# Patient Record
Sex: Female | Born: 1960
Health system: Southern US, Community
[De-identification: ages and names within clinical notes are randomized; demographics above are authoritative.]

## PROBLEM LIST (undated history)

## (undated) DIAGNOSIS — J45909 Unspecified asthma, uncomplicated: Secondary | ICD-10-CM

## (undated) DIAGNOSIS — E119 Type 2 diabetes mellitus without complications: Secondary | ICD-10-CM

## (undated) DIAGNOSIS — J310 Chronic rhinitis: Secondary | ICD-10-CM

## (undated) HISTORY — DX: Chronic rhinitis: J31.0

---

## 2010-09-25 HISTORY — PX: VEIN SURGERY: SHX48

## 2015-08-10 ENCOUNTER — Ambulatory Visit (INDEPENDENT_AMBULATORY_CARE_PROVIDER_SITE_OTHER): Payer: Managed Care, Other (non HMO) | Admitting: Allergy and Immunology

## 2015-08-10 ENCOUNTER — Encounter: Payer: Self-pay | Admitting: Allergy and Immunology

## 2015-08-10 VITALS — BP 110/70 | HR 76 | Temp 98.2°F | Resp 16 | Ht 64.76 in | Wt 194.0 lb

## 2015-08-10 DIAGNOSIS — J3089 Other allergic rhinitis: Secondary | ICD-10-CM | POA: Insufficient documentation

## 2015-08-10 DIAGNOSIS — J45901 Unspecified asthma with (acute) exacerbation: Secondary | ICD-10-CM | POA: Diagnosis not present

## 2015-08-10 HISTORY — DX: Other allergic rhinitis: J30.89

## 2015-08-10 MED ORDER — ALBUTEROL SULFATE 108 (90 BASE) MCG/ACT IN AEPB: 1.0000 | INHALATION_SPRAY | RESPIRATORY_TRACT | Status: DC | PRN

## 2015-08-10 MED ORDER — BUDESONIDE-FORMOTEROL FUMARATE 160-4.5 MCG/ACT IN AERO
INHALATION_SPRAY | RESPIRATORY_TRACT | Status: DC
Start: 2015-08-10 — End: 2015-09-07

## 2015-08-10 MED ORDER — MONTELUKAST SODIUM 10 MG PO TABS: 10.0000 mg | ORAL_TABLET | Freq: Every day | ORAL | Status: DC

## 2015-08-10 NOTE — Assessment & Plan Note (Addendum)
   To jumpstart symptom relief, prednisone has been provided: 20 mg x 4 days, 10 mg x1 day, then stop. A prescription has been provided for Symbicort (budesonide/formoterol) 160/4.5 g, 2 inhalations twice a day. To maximize pulmonary deposition, a spacer has been provided along with instructions for its proper administration with an HFA inhaler.  A prescription has been provided for montelukast 10 mg daily at bedtime.  A prescription has been provided for ProAir Respiclick, 1-2 inhalations every 4-6 hours as needed.  Subjective and objective measures of pulmonary function will be followed and the treatment plan will be adjusted accordingly.

## 2015-08-10 NOTE — Assessment & Plan Note (Signed)
   Aeroallergen avoidance measures have been discussed and provided in written form.  A prescription has been provided for montelukast (as above).  Continue fluticasone nasal spray, 2 sprays per nostril daily as needed.  Nasal saline lavage as needed has been recommended along with instructions for proper administration.  If allergen avoidance measures and medications fail to adequately relieve symptoms, we will consider immunotherapy.

## 2015-08-10 NOTE — Patient Instructions (Addendum)
Asthma with acute exacerbation  To jumpstart symptom relief, prednisone has been provided: 20 mg x 4 days, 10 mg x1 day, then stop. A prescription has been provided for Symbicort (budesonide/formoterol) 160/4.5 g, 2 inhalations twice a day. To maximize pulmonary deposition, a spacer has been provided along with instructions for its proper administration with an HFA inhaler.  A prescription has been provided for montelukast 10 mg daily at bedtime.  A prescription has been provided for ProAir Respiclick, 1-2 inhalations every 4-6 hours as needed.  Subjective and objective measures of pulmonary function will be followed and the treatment plan will be adjusted accordingly.     Allergic rhinitis  Aeroallergen avoidance measures have been discussed and provided in written form.  A prescription has been provided for montelukast (as above).  Continue fluticasone nasal spray, 2 sprays per nostril daily as needed.  Nasal saline lavage as needed has been recommended along with instructions for proper administration.  If allergen avoidance measures and medications fail to adequately relieve symptoms, we will consider immunotherapy.    Return in about 4 weeks (around 09/07/2015), or if symptoms worsen or fail to improve.   Reducing Pollen Exposure  The American Academy of Allergy, Asthma and Immunology suggests the following steps to reduce your exposure to pollen during allergy seasons.    1. Do not hang sheets or clothing out to dry; pollen may collect on these items. 2. Do not mow lawns or spend time around freshly cut grass; mowing stirs up pollen. 3. Keep windows closed at night.  Keep car windows closed while driving. 4. Minimize morning activities outdoors, a time when pollen counts are usually at their highest. 5. Stay indoors as much as possible when pollen counts or humidity is high and on windy days when pollen tends to remain in the air longer. 6. Use air conditioning when  possible.  Many air conditioners have filters that trap the pollen spores. 7. Use a HEPA room air filter to remove pollen form the indoor air you breathe.   Control of House Dust Mite Allergen  House dust mites play a major role in allergic asthma and rhinitis.  They occur in environments with high humidity wherever human skin, the food for dust mites is found. High levels have been detected in dust obtained from mattresses, pillows, carpets, upholstered furniture, bed covers, clothes and soft toys.  The principal allergen of the house dust mite is found in its feces.  A gram of dust may contain 1,000 mites and 250,000 fecal particles.  Mite antigen is easily measured in the air during house cleaning activities.    1. Encase mattresses, including the box spring, and pillow, in an air tight cover.  Seal the zipper end of the encased mattresses with wide adhesive tape. 2. Wash the bedding in water of 130 degrees Farenheit weekly.  Avoid cotton comforters/quilts and flannel bedding: the most ideal bed covering is the dacron comforter. 3. Remove all upholstered furniture from the bedroom. 4. Remove carpets, carpet padding, rugs, and non-washable window drapes from the bedroom.  Wash drapes weekly or use plastic window coverings. 5. Remove all non-washable stuffed toys from the bedroom.  Wash stuffed toys weekly. 6. Have the room cleaned frequently with a vacuum cleaner and a damp dust-mop.  The patient should not be in a room which is being cleaned and should wait 1 hour after cleaning before going into the room. 7. Close and seal all heating outlets in the bedroom.  Otherwise, the room  will become filled with dust-laden air.  An electric heater can be used to heat the room. Reduce indoor humidity to less than 50%.  Do not use a humidifier.  Control of Dog or Cat Allergen  Avoidance is the best way to manage a dog or cat allergy. If you have a dog or cat and are allergic to dog or cats, consider  removing the dog or cat from the home. If you have a dog or cat but don't want to find it a new home, or if your family wants a pet even though someone in the household is allergic, here are some strategies that may help keep symptoms at bay:  1. Keep the pet out of your bedroom and restrict it to only a few rooms. Be advised that keeping the dog or cat in only one room will not limit the allergens to that room. 2. Don't pet, hug or kiss the dog or cat; if you do, wash your hands with soap and water. 3. High-efficiency particulate air (HEPA) cleaners run continuously in a bedroom or living room can reduce allergen levels over time. 4. Regular use of a high-efficiency vacuum cleaner or a central vacuum can reduce allergen levels. 5. Giving your dog or cat a bath at least once a week can reduce airborne allergen.  Control of Mold Allergen  Mold and fungi can grow on a variety of surfaces provided certain temperature and moisture conditions exist.  Outdoor molds grow on plants, decaying vegetation and soil.  The major outdoor mold, Alternaria dn Cladosporium, are found in very high numbers during hot and dry conditions.  Generally, a late Summer - Fall peak is seen for common outdoor fungal spores.  Rain will temporarily lower outdoor mold spore count, but counts rise rapidly when the rainy period ends.  The most important indoor molds are Aspergillus and Penicillium.  Dark, humid and poorly ventilated basements are ideal sites for mold growth.  The next most common sites of mold growth are the bathroom and the kitchen.  Outdoor [employer] 2. Use air conditioning and keep windows closed 3. Avoid exposure to decaying vegetation. 4. Avoid leaf raking. 5. Avoid grain handling. 6. Consider wearing a face mask if working in moldy areas.  Indoor Mold Control 1. Maintain humidity below 50%. 2. Clean washable surfaces with 5% bleach solution. 3. Remove sources e.g. Contaminated carpets.  Control of  Cockroach Allergen  Cockroach allergen has been identified as an important cause of acute attacks of asthma, especially in urban settings.  There are fifty-five species of cockroach that exist in the [place], however only three, the [place], [place] species produce allergen that can affect patients with Asthma.  Allergens can be obtained from fecal particles, egg casings and secretions from cockroaches.    1. Remove food sources. 2. Reduce access to water. 3. Seal access and entry points. 4. Spray runways with 0.5-1% Diazinon or Chlorpyrifos 5. Blow boric acid power under stoves and refrigerator. 6. Place bait stations (hydramethylnon) at feeding sites.

## 2015-08-10 NOTE — Progress Notes (Signed)
History of present illness: HPI Comments: Sierra Lyons is a 54 y.o. female who presents today for her initial evaluation of possible asthma.  She complains of coughing, dyspnea, and wheezing.  The symptoms are more severe at nighttime and triggered by moderate physical exertion.  At times the cough is productive thick but clear mucus.  The symptoms started in June and have not progressed since that time.  She reports that she has experienced nocturnal awakenings due to lower respiratory symptoms every night over the past month.  She has no known family history of asthma.  She also experiences sneezing, nasal congestion, rhinorrhea, and some postnasal drainage.  Fluticasone nasal spray has been beneficial.   Assessment and plan: Asthma with acute exacerbation  To jumpstart symptom relief, prednisone has been provided: 20 mg x 4 days, 10 mg x1 day, then stop. A prescription has been provided for Symbicort (budesonide/formoterol) 160/4.5 g, 2 inhalations twice a day. To maximize pulmonary deposition, a spacer has been provided along with instructions for its proper administration with an HFA inhaler.  A prescription has been provided for montelukast 10 mg daily at bedtime.  A prescription has been provided for ProAir Respiclick, 1-2 inhalations every 4-6 hours as needed.  Subjective and objective measures of pulmonary function will be followed and the treatment plan will be adjusted accordingly.  Allergic rhinitis  Aeroallergen avoidance measures have been discussed and provided in written form.  A prescription has been provided for montelukast (as above).  Continue fluticasone nasal spray, 2 sprays per nostril daily as needed.  Nasal saline lavage as needed has been recommended along with instructions for proper administration.  If allergen avoidance measures and medications fail to adequately relieve symptoms, we will consider immunotherapy.    Medications ordered this  encounter: Meds ordered this encounter  Medications  . budesonide-formoterol (SYMBICORT) 160-4.5 MCG/ACT inhaler    Sig: INHALE TWO PUFFS TWICE DAILY TO PREVENT COUGH OR WHEEZE. RINSE, GARGLE, AND SPIT AFTER USE. USE WITH SPACER.    Dispense:  1 Inhaler    Refill:  5  . montelukast (SINGULAIR) 10 MG tablet    Sig: Take 1 tablet (10 mg total) by mouth at bedtime.    Dispense:  30 tablet    Refill:  5  . Albuterol Sulfate (PROAIR RESPICLICK) 108 (90 BASE) MCG/ACT AEPB    Sig: Inhale 1-2 puffs into the lungs every 4 (four) hours as needed (for cough or wheeze).    Dispense:  1 each    Refill:  1    Diagnositics: Spirometry: FVC is 3.21 L and FEV1 is 2.41 L (91% predicted) with significant (280 mL, 12%) post-bronchodilator improvement. Allergy skin testing: Aeroallergen skin tests are positive to grass pollen, weed pollen, ragweed pollen, tree pollen, mold, cat hair, dust mite, cockroach antigen.     Physical examination: Blood pressure 110/70, pulse 76, temperature 98.2 F (36.8 C), temperature source Oral, resp. rate 16, height 5' 4.76" (1.645 m), weight 194 lb 0.1 oz (88 kg).  General: Alert, interactive, in no acute distress. HEENT: TMs pearly gray, turbinates moderately edematous with thick discharge, post-pharynx mildly erythematous. Neck: Supple without lymphadenopathy. Lungs: Clear to auscultation without wheezing, rhonchi or rales. CV: Normal S1, S2 without murmurs. Abdomen: Nondistended, nontender. Skin: Warm and dry, without lesions or rashes. Extremities:  No clubbing, cyanosis or edema. Neuro:   Grossly intact.  Review of systems: Review of Systems  Constitutional: Negative for fever, chills and weight loss.  HENT: Negative for nosebleeds.   Eyes:  Negative for blurred vision.  Respiratory: Positive for sputum production, shortness of breath and wheezing. Negative for hemoptysis.   Cardiovascular: Negative for chest pain.  Gastrointestinal: Negative for diarrhea  and constipation.  Genitourinary: Negative for dysuria.  Musculoskeletal: Negative for myalgias and joint pain.  Neurological: Negative for dizziness.  Endo/Heme/Allergies: Does not bruise/bleed easily.    Past medical history: Past Medical History  Diagnosis Date  . Chronic rhinitis around 1986    Past surgical history: Past Surgical History  Procedure Laterality Date  . Vein surgery  2012  . Cesarean section  1993, 1997, 1999    Family history: Family History  Problem Relation Age of Onset  . Diabetes Mother   . Hypertension Mother   . Allergic rhinitis Father   . Allergic rhinitis Sister   . Asthma Sister   . Urticaria Sister   . Diabetes Maternal Grandmother   . Hypertension Maternal Grandmother     Social history: Social History   Social History  . Marital Status: Single    Spouse Name: N/A  . Number of Children: N/A  . Years of Education: N/A   Occupational History  . Not on file.   Social History Main Topics  . Smoking status: Never Smoker   . Smokeless tobacco: Never Used  . Alcohol Use: Not on file  . Drug Use: Not on file  . Sexual Activity: Not on file   Other Topics Concern  . Not on file   Social History Narrative  . No narrative on file   Environmental History:  Sierra Lyons lives in a 52 year old house with carpeting throughout and central air/heat.  She is a nonsmoker without pets.  Known medication allergies: Allergies  Allergen Reactions  . Aspirin Swelling    Outpatient medications:   Medication List       This list is accurate as of: 08/10/15  1:07 PM.  Always use your most recent med list.               Albuterol Sulfate 108 (90 BASE) MCG/ACT Aepb  Commonly known as:  PROAIR RESPICLICK  Inhale 1-2 puffs into the lungs every 4 (four) hours as needed (for cough or wheeze).     budesonide-formoterol 160-4.5 MCG/ACT inhaler  Commonly known as:  SYMBICORT  INHALE TWO PUFFS TWICE DAILY TO PREVENT COUGH OR WHEEZE. RINSE,  GARGLE, AND SPIT AFTER USE. USE WITH SPACER.     dextromethorphan-guaiFENesin 30-600 MG 12hr tablet  Commonly known as:  MUCINEX DM  Take 1 tablet by mouth 2 (two) times daily.     fluticasone 50 MCG/ACT nasal spray  Commonly known as:  FLONASE  Place 1 spray into both nostrils daily.     montelukast 10 MG tablet  Commonly known as:  SINGULAIR  Take 1 tablet (10 mg total) by mouth at bedtime.        I appreciate the opportunity to take part in this [facility] care. Please do not hesitate to contact me with questions.  Sincerely,   R. Jorene Guest, MD

## 2015-09-07 ENCOUNTER — Ambulatory Visit (INDEPENDENT_AMBULATORY_CARE_PROVIDER_SITE_OTHER): Payer: Managed Care, Other (non HMO) | Admitting: Allergy and Immunology

## 2015-09-07 ENCOUNTER — Encounter: Payer: Self-pay | Admitting: Allergy and Immunology

## 2015-09-07 VITALS — BP 114/72 | HR 68 | Resp 16

## 2015-09-07 DIAGNOSIS — J4540 Moderate persistent asthma, uncomplicated: Secondary | ICD-10-CM | POA: Insufficient documentation

## 2015-09-07 DIAGNOSIS — J3089 Other allergic rhinitis: Secondary | ICD-10-CM | POA: Diagnosis not present

## 2015-09-07 HISTORY — DX: Moderate persistent asthma, uncomplicated: J45.40

## 2015-09-07 MED ORDER — BUDESONIDE-FORMOTEROL FUMARATE 80-4.5 MCG/ACT IN AERO: INHALATION_SPRAY | RESPIRATORY_TRACT | Status: DC

## 2015-09-07 NOTE — Assessment & Plan Note (Signed)
   Continue allergen avoidance measures, montelukast 10 mg daily, and fluticasone nasal spray as needed.

## 2015-09-07 NOTE — Progress Notes (Signed)
RE: Sierra Lyons MRN: [id number] DOB: 07/21/1961 [facility] [place] [address] [place] [id number] Date of Office Visit: 09/07/2015  Referring provider: No referring provider defined for this encounter.  Chief Complaint: Asthma and Medication Management   History of present illness: HPI Comments: Sierra Lyons is a 54 y.o. female with moderate persistent asthma and allergic rhinitis who presents today for follow up.  She reports significant improvement regarding upper and lower airway symptoms and states that she feels "completely different."  In the interval since her previous visit one month ago she has not required rescue medication, experienced nocturnal awakenings due to lower respiratory symptoms, nor have activities of daily living been limited.  She has no nasal symptom complaints today.   Assessment and plan: Moderate persistent asthma Well-controlled.  We will stepdown therapy at this time.  A prescription has been provided for Symbicort (budesonide/formoterol) 80/4.5 g, 2 inhalations via spacer device twice a day.  If lower respiratory symptoms progress in frequency and/or severity, the patient is to resume the previous dose.  For now, continue montelukast 10 mg daily bedtime and albuterol HFA as needed.  Subjective and objective measures of pulmonary function will be followed and the treatment plan will be adjusted accordingly.  Allergic rhinitis  Continue allergen avoidance measures, montelukast 10 mg daily, and fluticasone nasal spray as needed.    Medications ordered this encounter: Meds ordered this encounter  Medications  . budesonide-formoterol (SYMBICORT) 80-4.5 MCG/ACT inhaler    Sig: INHALE TWO PUFFS TWICE DAILY TO PREVENT COUGH OR WHEEZE. RINSE MOUTH AFTER USE. USE WITH SPACER.    Dispense:  1 Inhaler    Refill:  5    PLEASE KEEP ON FILE, PATIENT WILL CALL WHEN READY.     Diagnositics: Spirometry:  Normal with an FEV1 of 106% predicted.  Please see scanned spirometry results for details.    Physical examination: Blood pressure 114/72, pulse 68, resp. rate 16.  General: Alert, interactive, in no acute distress. HEENT: TMs pearly gray, turbinates minimally edematous without discharge, post-pharynx non erythematous. Neck: Supple without lymphadenopathy. Lungs: Clear to auscultation without wheezing, rhonchi or rales. CV: Normal S1, S2 without murmurs. Skin: Warm and dry, without lesions or rashes.  The following portions of the patient's history were reviewed and updated as appropriate: allergies, current medications, past family history, past medical history, past social history, past surgical history and problem list.  Outpatient medications:   Medication List       This list is accurate as of: 09/07/15 10:06 AM.  Always use your most recent med list.               Albuterol Sulfate 108 (90 BASE) MCG/ACT Aepb  Commonly known as:  PROAIR RESPICLICK  Inhale 1-2 puffs into the lungs every 4 (four) hours as needed (for cough or wheeze).     budesonide-formoterol 80-4.5 MCG/ACT inhaler  Commonly known as:  SYMBICORT  INHALE TWO PUFFS TWICE DAILY TO PREVENT COUGH OR WHEEZE. RINSE MOUTH AFTER USE. USE WITH SPACER.     dextromethorphan-guaiFENesin 30-600 MG 12hr tablet  Commonly known as:  MUCINEX DM  Take 1 tablet by mouth 2 (two) times daily.     fluticasone 50 MCG/ACT nasal spray  Commonly known as:  FLONASE  Place 1 spray into both nostrils daily.     montelukast 10 MG tablet  Commonly known as:  SINGULAIR  Take 1 tablet (10 mg total) by mouth  at bedtime.        Known medication allergies: Allergies  Allergen Reactions  . Aspirin Swelling    I appreciate the opportunity to take part in this [facility] care. Please do not hesitate to contact me with questions.  Sincerely,   R. Jorene Guest, MD

## 2015-09-07 NOTE — Patient Instructions (Addendum)
Moderate persistent asthma Well-controlled.  We will stepdown therapy at this time.  A prescription has been provided for Symbicort (budesonide/formoterol) 80/4.5 g, 2 inhalations via spacer device twice a day.  If lower respiratory symptoms progress in frequency and/or severity, the patient is to resume the previous dose.  For now, continue montelukast 10 mg daily bedtime and albuterol HFA as needed.  Subjective and objective measures of pulmonary function will be followed and the treatment plan will be adjusted accordingly.  Allergic rhinitis  Continue allergen avoidance measures, montelukast 10 mg daily, and fluticasone nasal spray as needed.    Return in about 4 months (around 01/06/2016), or if symptoms worsen or fail to improve.

## 2015-09-07 NOTE — Assessment & Plan Note (Addendum)
Well-controlled.  We will stepdown therapy at this time.  A prescription has been provided for Symbicort (budesonide/formoterol) 80/4.5 g, 2 inhalations via spacer device twice a day.  If lower respiratory symptoms progress in frequency and/or severity, the patient is to resume the previous dose.  For now, continue montelukast 10 mg daily bedtime and albuterol HFA as needed.  Subjective and objective measures of pulmonary function will be followed and the treatment plan will be adjusted accordingly.

## 2015-09-13 ENCOUNTER — Encounter: Payer: Self-pay | Admitting: *Deleted

## 2015-10-15 ENCOUNTER — Telehealth: Payer: Self-pay | Admitting: Allergy and Immunology

## 2015-10-15 NOTE — Telephone Encounter (Signed)
Spoke with patient advised the medications we have her on would not cause her to have body cramps that happen at night. Advised to go see pcp and they would advise from there.

## 2015-10-15 NOTE — Telephone Encounter (Signed)
Pt called and that she is having body pain and thanks it from one of the meds that she was put on. [facility] [place] [phone].

## 2015-11-10 ENCOUNTER — Encounter (HOSPITAL_COMMUNITY): Payer: Self-pay | Admitting: *Deleted

## 2015-11-10 ENCOUNTER — Emergency Department (HOSPITAL_COMMUNITY)
Admission: EM | Admit: 2015-11-10 | Discharge: 2015-11-10 | Disposition: A | Discharge status: Home / Self Care | Payer: 59 | Attending: Emergency Medicine | Admitting: Emergency Medicine

## 2015-11-10 DIAGNOSIS — R1084 Generalized abdominal pain: Secondary | ICD-10-CM | POA: Diagnosis not present

## 2015-11-10 DIAGNOSIS — R358 Other polyuria: Secondary | ICD-10-CM | POA: Diagnosis present

## 2015-11-10 DIAGNOSIS — M79609 Pain in unspecified limb: Secondary | ICD-10-CM | POA: Insufficient documentation

## 2015-11-10 DIAGNOSIS — Z79899 Other long term (current) drug therapy: Secondary | ICD-10-CM | POA: Insufficient documentation

## 2015-11-10 DIAGNOSIS — L293 Anogenital pruritus, unspecified: Secondary | ICD-10-CM | POA: Diagnosis not present

## 2015-11-10 DIAGNOSIS — Z7951 Long term (current) use of inhaled steroids: Secondary | ICD-10-CM | POA: Insufficient documentation

## 2015-11-10 DIAGNOSIS — R739 Hyperglycemia, unspecified: Secondary | ICD-10-CM | POA: Diagnosis not present

## 2015-11-10 DIAGNOSIS — Z8709 Personal history of other diseases of the respiratory system: Secondary | ICD-10-CM | POA: Insufficient documentation

## 2015-11-10 LAB — URINALYSIS, ROUTINE W REFLEX MICROSCOPIC
Bilirubin Urine: NEGATIVE
Hgb urine dipstick: NEGATIVE
Ketones, ur: >80 mg/dL — AB
LEUKOCYTES UA: NEGATIVE
NITRITE: NEGATIVE
PROTEIN: NEGATIVE mg/dL
Specific Gravity, Urine: 1.039 — ABNORMAL HIGH (ref 1.005–1.030)
pH: 5 (ref 5.0–8.0)

## 2015-11-10 LAB — BLOOD GAS, VENOUS
ACID-BASE EXCESS: 1.7 mmol/L (ref 0.0–2.0)
BICARBONATE: 26.7 meq/L — AB (ref 20.0–24.0)
FIO2: 0.21
O2 SAT: 41.6 %
PATIENT TEMPERATURE: 98.6
TCO2: 23.7 mmol/L (ref 0–100)
pCO2, Ven: 45.3 mmHg (ref 45.0–50.0)
pH, Ven: 7.388 — ABNORMAL HIGH (ref 7.250–7.300)

## 2015-11-10 LAB — CBG MONITORING, ED
GLUCOSE-CAPILLARY: 291 mg/dL — AB (ref 65–99)
Glucose-Capillary: 169 mg/dL — ABNORMAL HIGH (ref 65–99)

## 2015-11-10 LAB — BASIC METABOLIC PANEL
Anion gap: 11 (ref 5–15)
BUN: 15 mg/dL (ref 6–20)
CALCIUM: 9.5 mg/dL (ref 8.9–10.3)
CO2: 25 mmol/L (ref 22–32)
Chloride: 100 mmol/L — ABNORMAL LOW (ref 101–111)
Creatinine, Ser: 0.69 mg/dL (ref 0.44–1.00)
GFR calc Af Amer: >60 mL/min (ref >60)
GLUCOSE: 288 mg/dL — AB (ref 65–99)
Potassium: 4.3 mmol/L (ref 3.5–5.1)
Sodium: 136 mmol/L (ref 135–145)

## 2015-11-10 LAB — URINE MICROSCOPIC-ADD ON: RBC / HPF: NONE SEEN RBC/hpf (ref 0–5)

## 2015-11-10 MED ORDER — SODIUM CHLORIDE 0.9 % IV BOLUS (SEPSIS)
1000.0000 mL | Freq: Once | INTRAVENOUS | Status: AC
  Administered 2015-11-10: 1000 mL via INTRAVENOUS

## 2015-11-10 MED ORDER — METFORMIN HCL 500 MG PO TABS: 500.0000 mg | ORAL_TABLET | Freq: Two times a day (BID) | ORAL | Status: DC

## 2015-11-10 MED ORDER — FLUCONAZOLE 200 MG PO TABS: 200.0000 mg | ORAL_TABLET | Freq: Every day | ORAL | Status: AC

## 2015-11-10 MED ORDER — SODIUM CHLORIDE 0.9 % IV BOLUS (SEPSIS): 1000.0000 mL | Freq: Once | INTRAVENOUS | Status: DC

## 2015-11-10 NOTE — ED Notes (Signed)
Pt reports bilat leg cramping x4-6 weeks, polyuria and polydipsia as well - pt also c/o vaginal burning/itching, has used OTC medications w/o relief.

## 2015-11-10 NOTE — Discharge Instructions (Signed)
As discussed, your symptoms are likely due to hyper glycemia, or elevated blood sugar.  You are starting a new medication.  Is very important to follow-up with a primary care physician within the next 2 weeks to insure that your condition has resolved, and the medication is appropriate.  Return here for any concerning changes in your condition.

## 2015-11-10 NOTE — ED Provider Notes (Signed)
CSN: [id number]     Arrival date & time 11/10/15  1027 History   First MD Initiated Contact with Patient 11/10/15 [phone]     Chief Complaint  Patient presents with  . Leg Pain  . Polyuria  . Vaginal Itching     (Consider location/radiation/quality/duration/timing/severity/associated sxs/prior Treatment) HPI Patient presents with multiple complaints. Over at least the past month the patient has had diffuse bilateral lower abdominal cramping, polyuria, polydipsia, and dysuria. Patient did start a course of new allergy medication about the time of onset of symptoms, but has stopped this medication. Currently she takes no prescription medication. She denies ongoing abdominal pain, fever, chest pain, confusion, disorientation, hematuria. Patient denies chronic medical problems. Patient acknowledges family history of diabetes. Since onset, the patient has been drinking substantial fluids, with no reduction in her symptoms.   Past Medical History  Diagnosis Date  . Chronic rhinitis around 1986   Past Surgical History  Procedure Laterality Date  . Vein surgery  2012  . Cesarean section  1993, 1997, 1999   Family History  Problem Relation Age of Onset  . Diabetes Mother   . Hypertension Mother   . Allergic rhinitis Father   . Allergic rhinitis Sister   . Asthma Sister   . Urticaria Sister   . Diabetes Maternal Grandmother   . Hypertension Maternal Grandmother    Social History  Substance Use Topics  . Smoking status: Never Smoker   . Smokeless tobacco: Never Used  . Alcohol Use: No   OB History    No data available     Review of Systems  Constitutional:       Per HPI, otherwise negative  HENT:       Per HPI, otherwise negative  Respiratory:       Per HPI, otherwise negative  Cardiovascular:       Per HPI, otherwise negative  Gastrointestinal: Negative for vomiting.  Endocrine:       Negative aside from HPI  Genitourinary:       Neg aside from HPI    Musculoskeletal:       Per HPI, otherwise negative  Skin: Negative.   Neurological: Negative for syncope.      Allergies  Aspirin  Home Medications   Prior to Admission medications   Medication Sig Start Date End Date Taking? Authorizing Provider  Albuterol Sulfate (PROAIR RESPICLICK) 108 (90 BASE) MCG/ACT AEPB Inhale 1-2 puffs into the lungs every 4 (four) hours as needed (for cough or wheeze). 08/10/15  Yes Cristal Ford, MD  budesonide-formoterol (SYMBICORT) 80-4.5 MCG/ACT inhaler INHALE TWO PUFFS TWICE DAILY TO PREVENT COUGH OR WHEEZE. RINSE MOUTH AFTER USE. USE WITH SPACER. 09/07/15  Yes Cristal Ford, MD  montelukast (SINGULAIR) 10 MG tablet Take 1 tablet (10 mg total) by mouth at bedtime. 08/10/15  Yes Cristal Ford, MD  Multiple Vitamins-Minerals (CENTRUM ADULTS) TABS Take 1 tablet by mouth daily.   Yes Historical Provider, MD   BP 125/69 mmHg  Pulse 90  Temp(Src) 98.7 F (37.1 C) (Oral)  Resp 18  SpO2 100% Physical Exam  Constitutional: She is oriented to person, place, and time. She appears well-developed and well-nourished. No distress.  HENT:  Head: Normocephalic and atraumatic.  Eyes: Conjunctivae and EOM are normal.  Cardiovascular: Normal rate and regular rhythm.   Pulmonary/Chest: Effort normal and breath sounds normal. No stridor. No respiratory distress.  Abdominal: She exhibits no distension.  Musculoskeletal: She exhibits no edema or tenderness.  Neurological: She  is alert and oriented to person, place, and time. No cranial nerve deficit. She exhibits normal muscle tone. Coordination normal.  Skin: Skin is warm and dry.  Psychiatric: She has a normal mood and affect.  Nursing note and vitals reviewed.   ED Course  Procedures (including critical care time) Labs Review Labs Reviewed  URINALYSIS, ROUTINE W REFLEX MICROSCOPIC (NOT AT [facility]) - Abnormal; Notable for the following:    Specific Gravity, Urine 1.039 (*)    Glucose, UA  >1000 (*)    Ketones, ur >80 (*)    All other components within normal limits  URINE MICROSCOPIC-ADD ON - Abnormal; Notable for the following:    Squamous Epithelial / LPF 0-5 (*)    Bacteria, UA RARE (*)    All other components within normal limits  BLOOD GAS, VENOUS - Abnormal; Notable for the following:    pH, Ven 7.388 (*)    Bicarbonate 26.7 (*)    All other components within normal limits  BASIC METABOLIC PANEL - Abnormal; Notable for the following:    Chloride 100 (*)    Glucose, Bld 288 (*)    All other components within normal limits  CBG MONITORING, ED - Abnormal; Notable for the following:    Glucose-Capillary 291 (*)    All other components within normal limits  CBG MONITORING, ED    I have personally reviewed and evaluated these images and lab results as part of my medical decision-making.  Initial labs notable for ketonuria, glucosuria.  10:12 AM Patient awake, alert, aware of all findings, including concern for diabetes.   We discussed all findings again at length, and likelihood of vaginal irritation secondary to hyperglycemic, yeast infection.    11:59 AM Patient in no distress  MDM  Patient presents with multiple concerns, all of which has been present for several weeks. Here, the patient is found to be hyperglycemic, with ketonuria, glucosuria. Given the patient's description of polyuria, polydipsia, today's findings, patient likely has diabetes. Patient started on a course of metformin,  provided referral to primary care for repeat evaluation within 2 weeks for ongoing management.   Gerhard Munch, MD 11/10/15 1200

## 2015-11-22 ENCOUNTER — Encounter: Payer: Self-pay | Admitting: Family Medicine

## 2015-11-22 ENCOUNTER — Ambulatory Visit (INDEPENDENT_AMBULATORY_CARE_PROVIDER_SITE_OTHER): Payer: 59 | Admitting: Family Medicine

## 2015-11-22 ENCOUNTER — Other Ambulatory Visit (HOSPITAL_COMMUNITY)
Admission: RE | Admit: 2015-11-22 | Discharge: 2015-11-22 | Disposition: A | Discharge status: Home / Self Care | Payer: 59 | Source: Ambulatory Visit | Attending: Family Medicine | Admitting: Family Medicine

## 2015-11-22 VITALS — BP 96/60 | HR 77 | Temp 97.5°F | Ht 65.0 in | Wt 186.2 lb

## 2015-11-22 DIAGNOSIS — B379 Candidiasis, unspecified: Secondary | ICD-10-CM

## 2015-11-22 DIAGNOSIS — N760 Acute vaginitis: Secondary | ICD-10-CM | POA: Insufficient documentation

## 2015-11-22 DIAGNOSIS — E1165 Type 2 diabetes mellitus with hyperglycemia: Secondary | ICD-10-CM

## 2015-11-22 DIAGNOSIS — IMO0002 Reserved for concepts with insufficient information to code with codable children: Secondary | ICD-10-CM | POA: Insufficient documentation

## 2015-11-22 DIAGNOSIS — IMO0001 Reserved for inherently not codable concepts without codable children: Secondary | ICD-10-CM

## 2015-11-22 NOTE — Progress Notes (Signed)
Pre visit review using our clinic review tool, if applicable. No additional management support is needed unless otherwise documented below in the visit note. 

## 2015-11-22 NOTE — Patient Instructions (Signed)
You do have diabetes- this is probably why you have had the leg cramps and the vaginal yeast infection  I will be in touch with your labs asap- I will give you a call so we can talk In the meantime please check out the American Diabetes Association website for lots of good information about diet, exercise and self- care Try to eat a diet lower in sweets and also carbs (bread, rice, pasta)- more veggies and protein Exercise such as walking is also helpful in controlling blood sugar We will plan to meet again in a few weeks for a recheck

## 2015-11-22 NOTE — Progress Notes (Addendum)
[facility] at [facility] [address] [place], [place] [address] [phone]  Date:  11/22/2015   Name:  Sierra Lyons   DOB:  [birth year]   MRN:  [id number]  PCP:  Abbe Amsterdam, MD    Chief Complaint: New Patient (Initial Visit)   History of Present Illness:  Sierra Lyons is a 55 y.o. very pleasant female patient who presents with the following:  Here today as a new patient- history of asthma and rhinitis.   She is going to establish care.   She was seen in the ER about 2 weeks ago with leg cramps and other sx- it turned out that she has DM.  She did have not have an A1c but her fasting glucose was nearly 300.   She has no prior history of DM, was not aware of this dx prior She was started on metformin, also given diflucan for yeast vaginitis.  Her vaginal sx are nearly gone but she would like to do a swab to be sure it is cleared up.   She has not seen a primary doctor in several years but does see GYN on a fairly regular basis.  She is trying to clean up her diet- she notes that she did take a sedentary job and gained some weight recently   Her mother does have a history of DM  BP Readings from Last 3 Encounters:  11/22/15 96/60  11/10/15 117/77  09/07/15 114/72     Patient Active Problem List   Diagnosis Date Noted  . Moderate persistent asthma 09/07/2015  . Asthma with acute exacerbation 08/10/2015  . Allergic rhinitis 08/10/2015    Past Medical History  Diagnosis Date  . Chronic rhinitis around 1986    Past Surgical History  Procedure Laterality Date  . Vein surgery  2012  . Cesarean section  1993, 1997, 1999    Social History  Substance Use Topics  . Smoking status: Never Smoker   . Smokeless tobacco: Never Used  . Alcohol Use: No    Family History  Problem Relation Age of Onset  . Diabetes Mother   . Hypertension Mother   . Allergic rhinitis Father   . Allergic rhinitis Sister   .  Asthma Sister   . Urticaria Sister   . Diabetes Maternal Grandmother   . Hypertension Maternal Grandmother     Allergies  Allergen Reactions  . Aspirin Swelling    Swelling of the face     Medication list has been reviewed and updated.  Current Outpatient Prescriptions on File Prior to Visit  Medication Sig Dispense Refill  . Albuterol Sulfate (PROAIR RESPICLICK) 108 (90 BASE) MCG/ACT AEPB Inhale 1-2 puffs into the lungs every 4 (four) hours as needed (for cough or wheeze). 1 each 1  . budesonide-formoterol (SYMBICORT) 80-4.5 MCG/ACT inhaler INHALE TWO PUFFS TWICE DAILY TO PREVENT COUGH OR WHEEZE. RINSE MOUTH AFTER USE. USE WITH SPACER. 1 Inhaler 5  . metFORMIN (GLUCOPHAGE) 500 MG tablet Take 1 tablet (500 mg total) by mouth 2 (two) times daily. 60 tablet 0  . montelukast (SINGULAIR) 10 MG tablet Take 1 tablet (10 mg total) by mouth at bedtime. 30 tablet 5  . Multiple Vitamins-Minerals (CENTRUM ADULTS) TABS Take 1 tablet by mouth daily.     No current facility-administered medications on file prior to visit.    Review of Systems:  As per HPI- otherwise negative.   Physical Examination: Filed Vitals:   11/22/15 1624  BP: 96/60  Pulse: 77  Temp: 97.5 F (36.4 C)   Filed Vitals:   11/22/15 1624  Height:  (1.651 m)  Weight: 186 lb 3.2 oz (84.46 kg)   Body mass index is 30.99 kg/(m^2). Ideal Body Weight: Weight in (lb) to have BMI = 25: 149.9  GEN: WDWN, NAD, Non-toxic, A & O x 3, overweight, looks well HEENT: Atraumatic, Normocephalic. Neck supple. No masses, No LAD.  Bilateral TM wnl, oropharynx normal.  PEERL,EOMI.   Ears and Nose: No external deformity. CV: RRR, No M/G/R. No JVD. No thrill. No extra heart sounds. PULM: CTA B, no wheezes, crackles, rhonchi. No retractions. No resp. distress. No accessory muscle use. EXTR: No c/c/e NEURO Normal gait.  PSYCH: Normally interactive. Conversant. Not depressed or anxious appearing.  Calm demeanor.    Assessment  and Plan: Uncontrolled type 2 diabetes mellitus without complication, without long-term current use of insulin (HCC) - Plan: Comprehensive metabolic panel, Hemoglobin A1c  Monilia infection - Plan: Cervicovaginal ancillary only  Discussed diabetes with her. Plan to check her A1c today and if significantly high will increase her metformin.  Discussed diet, exercise.  Plan to see her back in 6-8 weeks for a recheck  Check wet prep to make sure monilia is cleared up Will not start an ace as her BP is on the low side- she does not have any sx of same however    Signed Abbe Amsterdam, MD  2/28- received her labs so far as below.  LMOM- A1c is quite high.  Will taper up her metformin to 1000 BID, work hard on diet  Results for orders placed or performed in visit on 11/22/15  Comprehensive metabolic panel  Result Value Ref Range   Sodium 135 135 - 145 mEq/L   Potassium 4.0 3.5 - 5.1 mEq/L   Chloride 99 96 - 112 mEq/L   CO2 31 19 - 32 mEq/L   Glucose, Bld 303 (H) 70 - 99 mg/dL   BUN 15 6 - 23 mg/dL   Creatinine, Ser 0.98 0.40 - 1.20 mg/dL   Total Bilirubin 0.4 0.2 - 1.2 mg/dL   Alkaline Phosphatase 39 39 - 117 U/L   AST 24 0 - 37 U/L   ALT 24 0 - 35 U/L   Total Protein 7.2 6.0 - 8.3 g/dL   Albumin 4.3 3.5 - 5.2 g/dL   Calcium 9.7 8.4 - 11.9 mg/dL   GFR 147.82 >95.62 mL/min  Hemoglobin A1c  Result Value Ref Range   Hgb A1c MFr Bld 13.0 (H) 4.6 - 6.5 %

## 2015-11-23 ENCOUNTER — Telehealth: Payer: Self-pay | Admitting: Family Medicine

## 2015-11-23 ENCOUNTER — Encounter: Payer: Self-pay | Admitting: Family Medicine

## 2015-11-23 DIAGNOSIS — IMO0001 Reserved for inherently not codable concepts without codable children: Secondary | ICD-10-CM

## 2015-11-23 DIAGNOSIS — E1165 Type 2 diabetes mellitus with hyperglycemia: Principal | ICD-10-CM

## 2015-11-23 LAB — COMPREHENSIVE METABOLIC PANEL
ALBUMIN: 4.3 g/dL (ref 3.5–5.2)
ALT: 24 U/L (ref 0–35)
AST: 24 U/L (ref 0–37)
Alkaline Phosphatase: 39 U/L (ref 39–117)
BUN: 15 mg/dL (ref 6–23)
CHLORIDE: 99 meq/L (ref 96–112)
CO2: 31 mEq/L (ref 19–32)
Calcium: 9.7 mg/dL (ref 8.4–10.5)
Creatinine, Ser: 0.63 mg/dL (ref 0.40–1.20)
GFR: 105.27 mL/min (ref >60.00)
Glucose, Bld: 303 mg/dL — ABNORMAL HIGH (ref 70–99)
POTASSIUM: 4 meq/L (ref 3.5–5.1)
SODIUM: 135 meq/L (ref 135–145)
Total Bilirubin: 0.4 mg/dL (ref 0.2–1.2)
Total Protein: 7.2 g/dL (ref 6.0–8.3)

## 2015-11-23 LAB — HEMOGLOBIN A1C: HEMOGLOBIN A1C: 13 % — AB (ref 4.6–6.5)

## 2015-11-23 MED ORDER — METFORMIN HCL 500 MG PO TABS: ORAL_TABLET | ORAL | Status: DC

## 2015-11-23 NOTE — Telephone Encounter (Signed)
Last OV: 11/22/15---Uncontrolled DM  Ok to place referrals?

## 2015-11-23 NOTE — Telephone Encounter (Signed)
Relation to ZO:XWRU Call back number:[phone]   Reason for call:  Patient requesting a referral to Nutrition Counseling and Diabetic teaching within [facility]

## 2015-11-23 NOTE — Addendum Note (Signed)
Addended by: Abbe Amsterdam C on: 11/23/2015 02:40 PM   Modules accepted: Orders

## 2015-11-24 LAB — CERVICOVAGINAL ANCILLARY ONLY: Wet Prep (BD Affirm): POSITIVE — AB

## 2015-11-24 NOTE — Telephone Encounter (Signed)
Sure, placed order today

## 2016-01-05 ENCOUNTER — Encounter: Payer: Self-pay | Admitting: Family Medicine

## 2016-01-05 ENCOUNTER — Ambulatory Visit (INDEPENDENT_AMBULATORY_CARE_PROVIDER_SITE_OTHER): Payer: 59 | Admitting: Family Medicine

## 2016-01-05 VITALS — BP 100/74 | HR 85 | Temp 98.7°F | Resp 16 | Ht 65.0 in | Wt 177.4 lb

## 2016-01-05 DIAGNOSIS — Z23 Encounter for immunization: Secondary | ICD-10-CM

## 2016-01-05 DIAGNOSIS — Z119 Encounter for screening for infectious and parasitic diseases, unspecified: Secondary | ICD-10-CM | POA: Diagnosis not present

## 2016-01-05 DIAGNOSIS — E663 Overweight: Secondary | ICD-10-CM | POA: Diagnosis not present

## 2016-01-05 DIAGNOSIS — G2581 Restless legs syndrome: Secondary | ICD-10-CM | POA: Diagnosis not present

## 2016-01-05 DIAGNOSIS — E1165 Type 2 diabetes mellitus with hyperglycemia: Secondary | ICD-10-CM

## 2016-01-05 DIAGNOSIS — IMO0001 Reserved for inherently not codable concepts without codable children: Secondary | ICD-10-CM

## 2016-01-05 HISTORY — DX: Overweight: E66.3

## 2016-01-05 LAB — BASIC METABOLIC PANEL
BUN: 18 mg/dL (ref 6–23)
CALCIUM: 10 mg/dL (ref 8.4–10.5)
CHLORIDE: 102 meq/L (ref 96–112)
CO2: 31 meq/L (ref 19–32)
Creatinine, Ser: 0.71 mg/dL (ref 0.40–1.20)
GFR: 91.66 mL/min (ref >60.00)
Glucose, Bld: 110 mg/dL — ABNORMAL HIGH (ref 70–99)
Potassium: 4.3 mEq/L (ref 3.5–5.1)
SODIUM: 138 meq/L (ref 135–145)

## 2016-01-05 LAB — HEMOGLOBIN A1C: Hgb A1c MFr Bld: 9.6 % — ABNORMAL HIGH (ref 4.6–6.5)

## 2016-01-05 LAB — HEPATITIS C ANTIBODY: HCV AB: NEGATIVE

## 2016-01-05 NOTE — Patient Instructions (Signed)
You are doing a great job!  Keep up the good work with your diet and exercise.  Please see me in 2-3 months for a visit and FASTING labs (fast for 6-8 hour prior) Please do see you eye doctor and plan to have a mammogram soon If you continue to have issues with restless legs we can try a medication such as requip if you like  Take care!

## 2016-01-05 NOTE — Progress Notes (Signed)
Pre visit review using our clinic review tool, if applicable. No additional management support is needed unless otherwise documented below in the visit note. 

## 2016-01-05 NOTE — Progress Notes (Signed)
[facility] [address] [place], [place] [address] [phone] [phone]  Date:  01/05/2016   Name:  Sierra Lyons   DOB:  11/21/1960   MRN:  [id number]  PCP:  Sierra AmsterdamOPLAND,Sierra Inthavong, MD    Chief Complaint: Diabetes   History of Present Illness:  Sierra Lyons is a 55 y.o. very pleasant female patient who presents with the following:  Here today to follow-up on recently dx DM.  She was last here about 6 weeks ago with the following HPI:  Here today as a new patient- history of asthma and rhinitis.  She is going to establish care.  She was seen in the ER about 2 weeks ago with leg cramps and other sx- it turned out that she has DM. She did have not have an A1c but her fasting glucose was nearly 300.  She has no prior history of DM, was not aware of this dx prior She was started on metformin, also given diflucan for yeast vaginitis. Her vaginal sx are nearly gone but she would like to do a swab to be sure it is cleared up.   She has not seen a primary doctor in several years but does see GYN on a fairly regular basis.  She is trying to clean up her diet- she notes that she did take a sedentary job and gained some weight recently   At her last visit A1c noted to be high-   Lab Results  Component Value Date   HGBA1C 13.0* 11/22/2015   At last visit we planned to titrate up her metformin to 1000 BID and work hard on diet.     She has been compliant with this plan!  She notes that she is doing better, the cramps in her legs are better.  She does suspect that she has RLS as she has had nighttime leg pains for a long time She has changed her diet- she is cutting down on carbs, breads, sweets, sodas.  She is walking some for exercise.  She does check her glucose at home- she may get 120- 180 on average.  No sx of low glucose She will plan to have her mammo soon per her OBG She does have an eye doctor and will be seen  soon  Wt Readings from Last 3 Encounters:  01/05/16 177 lb 6.4 oz (80.468 kg)  11/22/15 186 lb 3.2 oz (84.46 kg)  08/10/15 194 lb 0.1 oz (88 kg)     Patient Active Problem List   Diagnosis Date Noted  . Diabetes mellitus type 2, uncontrolled (HCC) 11/22/2015  . Moderate persistent asthma 09/07/2015  . Asthma with acute exacerbation 08/10/2015  . Allergic rhinitis 08/10/2015    Past Medical History  Diagnosis Date  . Chronic rhinitis around 1986    Past Surgical History  Procedure Laterality Date  . Vein surgery  2012  . Cesarean section  1993, 1997, 1999    Social History  Substance Use Topics  . Smoking status: Never Smoker   . Smokeless tobacco: Never Used  . Alcohol Use: No    Family History  Problem Relation Age of Onset  . Diabetes Mother   . Hypertension Mother   . Allergic rhinitis Father   . Allergic rhinitis Sister   . Asthma Sister   . Urticaria Sister   . Diabetes Maternal Grandmother   . Hypertension Maternal Grandmother     Allergies  Allergen Reactions  .  Aspirin Swelling    Swelling of the face     Medication list has been reviewed and updated.  Current Outpatient Prescriptions on File Prior to Visit  Medication Sig Dispense Refill  . Albuterol Sulfate (PROAIR RESPICLICK) 108 (90 BASE) MCG/ACT AEPB Inhale 1-2 puffs into the lungs every 4 (four) hours as needed (for cough or wheeze). 1 each 1  . budesonide-formoterol (SYMBICORT) 80-4.5 MCG/ACT inhaler INHALE TWO PUFFS TWICE DAILY TO PREVENT COUGH OR WHEEZE. RINSE MOUTH AFTER USE. USE WITH SPACER. 1 Inhaler 5  . metFORMIN (GLUCOPHAGE) 500 MG tablet Take 1 in the am and 2 at night for one week, then 2 twice a day 120 tablet 3  . montelukast (SINGULAIR) 10 MG tablet Take 1 tablet (10 mg total) by mouth at bedtime. 30 tablet 5  . Multiple Vitamins-Minerals (CENTRUM ADULTS) TABS Take 1 tablet by mouth daily.     No current facility-administered medications on file prior to visit.    Review of  Systems:  As per HPI- otherwise negative. She is generally feeling well, no fever or chills She has occasional nausea with metformin but this is improved No vomiting  Physical Examination: Filed Vitals:   01/05/16 0856  BP: 100/74  Pulse: 85  Temp: 98.7 F (37.1 C)  Resp: 16   Filed Vitals:   01/05/16 0856  Height:  (1.651 m)  Weight: 177 lb 6.4 oz (80.468 kg)   Body mass index is 29.52 kg/(m^2). Ideal Body Weight: Weight in (lb) to have BMI = 25: 149.9  GEN: WDWN, NAD, Non-toxic, A & O x 3, overweight, looks well HEENT: Atraumatic, Normocephalic. Neck supple. No masses, No LAD.   PEERL, oropharynx wnl Ears and Nose: No external deformity. CV: RRR, No M/G/R. No JVD. No thrill. No extra heart sounds. PULM: CTA B, no wheezes, crackles, rhonchi. No retractions. No resp. distress. No accessory muscle use. EXTR: No c/c/e NEURO Normal gait.  PSYCH: Normally interactive. Conversant. Not depressed or anxious appearing.  Calm demeanor.   Normal foot exam today  Assessment and Plan: Uncontrolled type 2 diabetes mellitus without complication, without long-term current use of insulin (HCC) - Plan: Pneumococcal polysaccharide vaccine 23-valent greater than or equal to 2yo subcutaneous/IM, Basic metabolic panel, Hemoglobin A1c, Microalbumin, urine  Screening examination for infectious disease - Plan: Hepatitis C antibody  Overweight  Restless legs  Recently dx DM. She is making good progress Continue her weight loss efforts and diet change. Continue current dose of metfomrin Will check A1c today although it has not been 3 months- we would like to see if any change Plan to recheck in 2-3 months for a fasting recheck Losing weight with lifestyle change- keep it up Restless legs-at this time she declines medications.  This may continue to improve with her diabetes control improvement.  We will keep an eye on it  Signed Sierra Amsterdam, MD

## 2016-01-06 LAB — MICROALBUMIN, URINE: MICROALB UR: 0.2 mg/dL

## 2016-03-08 ENCOUNTER — Ambulatory Visit (INDEPENDENT_AMBULATORY_CARE_PROVIDER_SITE_OTHER): Payer: 59 | Admitting: Family Medicine

## 2016-03-08 ENCOUNTER — Encounter: Payer: Self-pay | Admitting: Family Medicine

## 2016-03-08 VITALS — BP 102/60 | HR 87 | Temp 98.3°F | Ht 65.0 in | Wt 176.0 lb

## 2016-03-08 DIAGNOSIS — R5383 Other fatigue: Secondary | ICD-10-CM | POA: Diagnosis not present

## 2016-03-08 DIAGNOSIS — E1165 Type 2 diabetes mellitus with hyperglycemia: Secondary | ICD-10-CM

## 2016-03-08 DIAGNOSIS — E785 Hyperlipidemia, unspecified: Secondary | ICD-10-CM

## 2016-03-08 DIAGNOSIS — Z1211 Encounter for screening for malignant neoplasm of colon: Secondary | ICD-10-CM | POA: Diagnosis not present

## 2016-03-08 DIAGNOSIS — R1012 Left upper quadrant pain: Secondary | ICD-10-CM

## 2016-03-08 DIAGNOSIS — Z13220 Encounter for screening for lipoid disorders: Secondary | ICD-10-CM

## 2016-03-08 DIAGNOSIS — Z130 Encounter for screening for diseases of the blood and blood-forming organs and certain disorders involving the immune mechanism: Secondary | ICD-10-CM

## 2016-03-08 DIAGNOSIS — IMO0001 Reserved for inherently not codable concepts without codable children: Secondary | ICD-10-CM

## 2016-03-08 LAB — CBC
HEMATOCRIT: 39.3 % (ref 36.0–46.0)
Hemoglobin: 13.1 g/dL (ref 12.0–15.0)
MCHC: 33.3 g/dL (ref 30.0–36.0)
MCV: 92 fl (ref 78.0–100.0)
Platelets: 289 10*3/uL (ref 150.0–400.0)
RBC: 4.27 Mil/uL (ref 3.87–5.11)
RDW: 15.5 % (ref 11.5–15.5)
WBC: 7.2 10*3/uL (ref 4.0–10.5)

## 2016-03-08 LAB — COMPREHENSIVE METABOLIC PANEL
ALBUMIN: 4.2 g/dL (ref 3.5–5.2)
ALK PHOS: 46 U/L (ref 39–117)
ALT: 21 U/L (ref 0–35)
AST: 16 U/L (ref 0–37)
BUN: 15 mg/dL (ref 6–23)
CALCIUM: 9.8 mg/dL (ref 8.4–10.5)
CHLORIDE: 103 meq/L (ref 96–112)
CO2: 28 mEq/L (ref 19–32)
CREATININE: 0.64 mg/dL (ref 0.40–1.20)
GFR: 103.26 mL/min (ref >60.00)
Glucose, Bld: 103 mg/dL — ABNORMAL HIGH (ref 70–99)
POTASSIUM: 4.1 meq/L (ref 3.5–5.1)
SODIUM: 138 meq/L (ref 135–145)
TOTAL PROTEIN: 7.2 g/dL (ref 6.0–8.3)
Total Bilirubin: 0.4 mg/dL (ref 0.2–1.2)

## 2016-03-08 LAB — LIPID PANEL
CHOLESTEROL: 230 mg/dL — AB (ref 0–200)
HDL: 67.9 mg/dL (ref >39.00)
LDL CALC: 138 mg/dL — AB (ref 0–99)
NonHDL: 162
TRIGLYCERIDES: 121 mg/dL (ref 0.0–149.0)
Total CHOL/HDL Ratio: 3
VLDL: 24.2 mg/dL (ref 0.0–40.0)

## 2016-03-08 LAB — HEMOGLOBIN A1C: HEMOGLOBIN A1C: 6.3 % (ref 4.6–6.5)

## 2016-03-08 LAB — TSH: TSH: 1.61 u[IU]/mL (ref 0.35–4.50)

## 2016-03-08 LAB — LIPASE: LIPASE: 59 U/L (ref 11.0–59.0)

## 2016-03-08 NOTE — Progress Notes (Addendum)
[facility] [address], [address] [place], [place] [address] [phone] [phone]  Date:  03/08/2016   Name:  Sierra Samlizabeth Jeffords   DOB:  09/08/1961   MRN:  [id number]  PCP:  Abbe AmsterdamOPLAND,Jalila Goodnough, MD    Chief Complaint: Follow-up   History of Present Illness:  Sierra Lyons is a 55 y.o. very pleasant female patient who presents with the following:  Here today to recheck DM which was newly dx earlier this year Wt Readings from Last 3 Encounters:  03/08/16 176 lb (79.833 kg)  01/05/16 177 lb 6.4 oz (80.468 kg)  11/22/15 186 lb 3.2 oz (84.46 kg)   She is feeling overall better, but does feel kind of tired out.   She is trying to watch her diet.  She is up to 4 pills of her metfomrin daily- no SE.  She is walking for exercise  She is fasting today for labs Her last mammo was about a year ago and she will update this soon, already has plans to do so No recent eye exam- however she did call for an appt last week.  She has not yet had a colonoscopy done but would like a referral for this  She has noted tenderness if she presses right on the left lower rib border for about one year- no other GI symptoms, she does not have pain unless she presses right on the area.     Lab Results  Component Value Date   HGBA1C 9.6* 01/05/2016     Patient Active Problem List   Diagnosis Date Noted  . Overweight 01/05/2016  . Diabetes mellitus type 2, uncontrolled (HCC) 11/22/2015  . Moderate persistent asthma 09/07/2015  . Asthma with acute exacerbation 08/10/2015  . Allergic rhinitis 08/10/2015    Past Medical History  Diagnosis Date  . Chronic rhinitis around 1986    Past Surgical History  Procedure Laterality Date  . Vein surgery  2012  . Cesarean section  1993, 1997, 1999    Social History  Substance Use Topics  . Smoking status: Never Smoker   . Smokeless tobacco: Never Used  . Alcohol Use: No    Family History  Problem Relation  Age of Onset  . Diabetes Mother   . Hypertension Mother   . Allergic rhinitis Father   . Allergic rhinitis Sister   . Asthma Sister   . Urticaria Sister   . Diabetes Maternal Grandmother   . Hypertension Maternal Grandmother     Allergies  Allergen Reactions  . Aspirin Swelling    Swelling of the face     Medication list has been reviewed and updated.  Current Outpatient Prescriptions on File Prior to Visit  Medication Sig Dispense Refill  . Albuterol Sulfate (PROAIR RESPICLICK) 108 (90 BASE) MCG/ACT AEPB Inhale 1-2 puffs into the lungs every 4 (four) hours as needed (for cough or wheeze). 1 each 1  . budesonide-formoterol (SYMBICORT) 80-4.5 MCG/ACT inhaler INHALE TWO PUFFS TWICE DAILY TO PREVENT COUGH OR WHEEZE. RINSE MOUTH AFTER USE. USE WITH SPACER. 1 Inhaler 5  . metFORMIN (GLUCOPHAGE) 500 MG tablet Take 1 in the am and 2 at night for one week, then 2 twice a day 120 tablet 3  . montelukast (SINGULAIR) 10 MG tablet Take 1 tablet (10 mg total) by mouth at bedtime. 30 tablet 5  . Multiple Vitamins-Minerals (CENTRUM ADULTS) TABS Take 1 tablet by mouth daily.     No current facility-administered medications on file  prior to visit.    Review of Systems:  As per HPI- otherwise negative.   Physical Examination: Filed Vitals:   03/08/16 0906  BP: 102/60  Pulse: 87  Temp: 98.3 F (36.8 C)   Filed Vitals:   03/08/16 0906  Height:  (1.651 m)  Weight: 176 lb (79.833 kg)   Body mass index is 29.29 kg/(m^2). Ideal Body Weight: Weight in (lb) to have BMI = 25: 149.9  GEN: WDWN, NAD, Non-toxic, A & O x 3, overweight, looks well HEENT: Atraumatic, Normocephalic. Neck supple. No masses, No LAD.  Bilateral TM wnl, oropharynx normal.  PEERL,EOMI.   Ears and Nose: No external deformity. CV: RRR, No M/G/R. No JVD. No thrill. No extra heart sounds. PULM: CTA B, no wheezes, crackles, rhonchi. No retractions. No resp. distress. No accessory muscle use. ABD: S, NT, ND, +BS. No  rebound. No HSM.  Benign belly, no abnl noted in LUQ EXTR: No c/c/e NEURO Normal gait.  PSYCH: Normally interactive. Conversant. Not depressed or anxious appearing.  Calm demeanor.    Assessment and Plan: Uncontrolled type 2 diabetes mellitus without complication, without long-term current use of insulin (HCC) - Plan: Hemoglobin A1c, Lipid panel, Comprehensive metabolic panel  Special screening for malignant neoplasms, colon - Plan: Ambulatory referral to Gastroenterology  Screening for hyperlipidemia - Plan: Lipid panel  Screening for deficiency anemia - Plan: CBC  Other fatigue - Plan: TSH  LUQ abdominal pain - Plan: Lipase  Recheck DM today- await A1c  It was very nice to see you today!  I will be in touch with your labs asap Please ask your eye doctor to send me a copy of your eye exam results.  Also, I would like to have a copy of your mammogram report if possible I will refer you to gastroenterology for a screening colonoscopy  I will check on your thyroid and your pancreas with your labs today  Depending on your lab results we will plan the date of your next visit.  It was great to see you today!    Signed Abbe Amsterdam, MD  Received her labs and called her 6/15:  She has done an impressive job with her A1c and is to be commended.  Her other labs look ok but I would advised a low dose of cholesterol med to try and ger her LDL under 100.  She is agreeable to this. rx lovastatin 20 mg.  Lipase is upper normal- let's recheck at next visit   Results for orders placed or performed in visit on 03/08/16  Hemoglobin A1c  Result Value Ref Range   Hgb A1c MFr Bld 6.3 4.6 - 6.5 %  Lipid panel  Result Value Ref Range   Cholesterol 230 (H) 0 - 200 mg/dL   Triglycerides 161.0 0.0 - 149.0 mg/dL   HDL 96.04 >54.09 mg/dL   VLDL 81.1 0.0 - 91.4 mg/dL   LDL Cholesterol 782 (H) 0 - 99 mg/dL   Total CHOL/HDL Ratio 3    NonHDL 162.00   Comprehensive metabolic panel  Result Value  Ref Range   Sodium 138 135 - 145 mEq/L   Potassium 4.1 3.5 - 5.1 mEq/L   Chloride 103 96 - 112 mEq/L   CO2 28 19 - 32 mEq/L   Glucose, Bld 103 (H) 70 - 99 mg/dL   BUN 15 6 - 23 mg/dL   Creatinine, Ser 9.56 0.40 - 1.20 mg/dL   Total Bilirubin 0.4 0.2 - 1.2 mg/dL   Alkaline  Phosphatase 46 39 - 117 U/L   AST 16 0 - 37 U/L   ALT 21 0 - 35 U/L   Total Protein 7.2 6.0 - 8.3 g/dL   Albumin 4.2 3.5 - 5.2 g/dL   Calcium 9.8 8.4 - 16.1 mg/dL   GFR 096.04 >54.09 mL/min  CBC  Result Value Ref Range   WBC 7.2 4.0 - 10.5 K/uL   RBC 4.27 3.87 - 5.11 Mil/uL   Platelets 289.0 150.0 - 400.0 K/uL   Hemoglobin 13.1 12.0 - 15.0 g/dL   HCT 81.1 91.4 - 78.2 %   MCV 92.0 78.0 - 100.0 fl   MCHC 33.3 30.0 - 36.0 g/dL   RDW 95.6 21.3 - 08.6 %  Lipase  Result Value Ref Range   Lipase 59.0 11.0 - 59.0 U/L  TSH  Result Value Ref Range   TSH 1.61 0.35 - 4.50 uIU/mL

## 2016-03-08 NOTE — Patient Instructions (Addendum)
It was very nice to see you today!  I will be in touch with your labs asap Please ask your eye doctor to send me a copy of your eye exam results.  Also, I would like to have a copy of your mammogram report if possible I will refer you to gastroenterology for a screening colonoscopy  I will check on your thyroid and your pancreas with your labs today  Depending on your lab results we will plan the date of your next visit.  It was great to see you today!

## 2016-03-08 NOTE — Progress Notes (Signed)
Pre visit review using our clinic review tool, if applicable. No additional management support is needed unless otherwise documented below in the visit note. 

## 2016-03-09 ENCOUNTER — Encounter: Payer: Self-pay | Admitting: Family Medicine

## 2016-03-09 MED ORDER — LOVASTATIN 20 MG PO TABS: 20.0000 mg | ORAL_TABLET | Freq: Every day | ORAL | Status: DC

## 2016-03-09 NOTE — Addendum Note (Signed)
Addended by: Abbe AmsterdamOPLAND, Jyll Tomaro C on: 03/09/2016 04:21 PM   Modules accepted: Orders

## 2016-04-06 ENCOUNTER — Other Ambulatory Visit: Payer: Self-pay | Admitting: Family Medicine

## 2016-05-12 ENCOUNTER — Other Ambulatory Visit: Payer: Self-pay | Admitting: Family Medicine

## 2016-06-20 ENCOUNTER — Ambulatory Visit (INDEPENDENT_AMBULATORY_CARE_PROVIDER_SITE_OTHER): Payer: 59 | Admitting: Physician Assistant

## 2016-06-20 ENCOUNTER — Encounter: Payer: Self-pay | Admitting: Physician Assistant

## 2016-06-20 VITALS — BP 104/72 | HR 78 | Temp 98.5°F | Resp 16 | Ht 65.0 in | Wt 181.4 lb

## 2016-06-20 DIAGNOSIS — F418 Other specified anxiety disorders: Secondary | ICD-10-CM | POA: Diagnosis not present

## 2016-06-20 DIAGNOSIS — N951 Menopausal and female climacteric states: Secondary | ICD-10-CM | POA: Diagnosis not present

## 2016-06-20 DIAGNOSIS — G629 Polyneuropathy, unspecified: Secondary | ICD-10-CM | POA: Diagnosis not present

## 2016-06-20 DIAGNOSIS — F419 Anxiety disorder, unspecified: Secondary | ICD-10-CM

## 2016-06-20 DIAGNOSIS — F329 Major depressive disorder, single episode, unspecified: Secondary | ICD-10-CM

## 2016-06-20 DIAGNOSIS — F32A Depression, unspecified: Secondary | ICD-10-CM

## 2016-06-20 LAB — TSH: TSH: 1.27 u[IU]/mL (ref 0.35–4.50)

## 2016-06-20 LAB — COMPREHENSIVE METABOLIC PANEL
ALBUMIN: 4 g/dL (ref 3.5–5.2)
ALK PHOS: 44 U/L (ref 39–117)
ALT: 15 U/L (ref 0–35)
AST: 18 U/L (ref 0–37)
BUN: 16 mg/dL (ref 6–23)
CALCIUM: 9.5 mg/dL (ref 8.4–10.5)
CHLORIDE: 102 meq/L (ref 96–112)
CO2: 31 mEq/L (ref 19–32)
CREATININE: 0.65 mg/dL (ref 0.40–1.20)
GFR: 101.32 mL/min (ref >60.00)
Glucose, Bld: 70 mg/dL (ref 70–99)
POTASSIUM: 4.3 meq/L (ref 3.5–5.1)
Sodium: 139 mEq/L (ref 135–145)
TOTAL PROTEIN: 7.4 g/dL (ref 6.0–8.3)
Total Bilirubin: 0.3 mg/dL (ref 0.2–1.2)

## 2016-06-20 LAB — C-REACTIVE PROTEIN: CRP: 0.2 mg/dL — ABNORMAL LOW (ref 0.5–20.0)

## 2016-06-20 LAB — CBC
HCT: 39.9 % (ref 36.0–46.0)
HEMOGLOBIN: 13.7 g/dL (ref 12.0–15.0)
MCHC: 34.2 g/dL (ref 30.0–36.0)
MCV: 91.5 fl (ref 78.0–100.0)
PLATELETS: 287 10*3/uL (ref 150.0–400.0)
RBC: 4.36 Mil/uL (ref 3.87–5.11)
RDW: 13.9 % (ref 11.5–15.5)
WBC: 7.4 10*3/uL (ref 4.0–10.5)

## 2016-06-20 LAB — T4, FREE: FREE T4: 0.66 ng/dL (ref 0.60–1.60)

## 2016-06-20 LAB — VITAMIN B12: Vitamin B-12: 296 pg/mL (ref 211–911)

## 2016-06-20 MED ORDER — VENLAFAXINE HCL ER 37.5 MG PO CP24: 37.5000 mg | ORAL_CAPSULE | Freq: Every day | ORAL | 3 refills | Status: DC

## 2016-06-20 NOTE — Patient Instructions (Signed)
Please go to the lab for blood work.  I will call you with your results.  Please continue chronic medications. Start the Effexor Xr daily as directed. Do not skip doses.  If you note any worsening symptoms on medication, stop it and call and come see us immediately. If you note any thoughts of harming yourself or others, please call 911 or go to the ER.  Follow-up in 3-4 weeks with Dr. Patsy Lageropland.

## 2016-06-20 NOTE — Progress Notes (Signed)
Pre visit review using our clinic review tool, if applicable. No additional management support is needed unless otherwise documented below in the visit note/SLS  

## 2016-06-20 NOTE — Progress Notes (Signed)
Patient presents to clinic today c/o to discuss multiple concerns.  Patient endorses depressed mood intermittently over the past 7-8 months. Endorses has become more persistent over the past couple of months. Notes depressed mood and anhedonia. Notes change to sleep and focus. Endorses sensation of hopelessness. Denies suicidal thought or ideation. Denies homicidal thought or ideation denies panic attack but notes a sensation of feeling overwhelmed. Would consider herself a "worrywart".  Patient also noting hot flashes and sweats intermittently. Has already been diagnosed as perimenopausal by her primary care. Last menstrual period was within the past month. Before then, most recent menstrual period was 5-6 months ago. Endorses trying to turn down the thermostat at home, but still having hot flashes. Notes some intermittent mental fogginess. Denies altered mental status.  Patient endorses intermittent tingling of hands and feet bilaterally. Has been worsening over the past few months. Patient with history of DM II, currently on Metformin 1000 mg BID. Is taking as directed. Notes fasting sugars averaging 80-130. Denies history of abnormal thyroid function or B12 deficiency. Denies family history of neuropathy.  Lab Results  Component Value Date   HGBA1C 6.3 03/08/2016   Patient endorses intermittent myalgias or upper and lower extremities. Notes well-balanced diet and good hydration. Denies personal or family history of rheumatological conditions. Tylenol/Ibuprofen help with symptoms when present.    Past Medical History:  Diagnosis Date  . Chronic rhinitis around 1986    Current Outpatient Prescriptions on File Prior to Visit  Medication Sig Dispense Refill  . Albuterol Sulfate (PROAIR RESPICLICK) 202 (90 BASE) MCG/ACT AEPB Inhale 1-2 puffs into the lungs every 4 (four) hours as needed (for cough or wheeze). 1 each 1  . budesonide-formoterol (SYMBICORT) 80-4.5 MCG/ACT inhaler INHALE TWO  PUFFS TWICE DAILY TO PREVENT COUGH OR WHEEZE. RINSE MOUTH AFTER USE. USE WITH SPACER. 1 Inhaler 5  . metFORMIN (GLUCOPHAGE) 500 MG tablet TAKE 1 TABLET BY MOUTH EVERY MORNING AND 2 EVERY NIGHT AT BEDTIME FOR 1 WEEK, THEN 2 TABLETS TWICE DAILY (Patient taking differently: TAKE 2 TABLETS TWICE DAILY WITH MEALS) 120 tablet 3  . montelukast (SINGULAIR) 10 MG tablet Take 1 tablet (10 mg total) by mouth at bedtime. 30 tablet 5  . Multiple Vitamins-Minerals (CENTRUM ADULTS) TABS Take 1 tablet by mouth daily.    Marland Kitchen lovastatin (MEVACOR) 20 MG tablet Take 1 tablet (20 mg total) by mouth at bedtime. (Patient not taking: Reported on 06/20/2016) 90 tablet 3   No current facility-administered medications on file prior to visit.     Allergies  Allergen Reactions  . Aspirin Swelling    Swelling of the face     Family History  Problem Relation Age of Onset  . Diabetes Mother   . Hypertension Mother   . Allergic rhinitis Father   . Allergic rhinitis Sister   . Asthma Sister   . Urticaria Sister   . Diabetes Maternal Grandmother   . Hypertension Maternal Grandmother     Social History   Social History  . Marital status: Single    Spouse name: N/A  . Number of children: N/A  . Years of education: N/A   Social History Main Topics  . Smoking status: Never Smoker  . Smokeless tobacco: Never Used  . Alcohol use No  . Drug use: No  . Sexual activity: Yes    Birth control/ protection: None   Other Topics Concern  . None   Social History Narrative  . None   Review of Systems -  See HPI.  All other ROS are negative.  BP 104/72 (BP Location: Left Arm, Patient Position: Sitting)   Pulse 78   Temp 98.5 F (36.9 C) (Oral)   Resp 16   Ht '5\' 5"'$  (1.651 m)   Wt 181 lb 6 oz (82.3 kg)   SpO2 98%   BMI 30.18 kg/m   Physical Exam  Constitutional: She is oriented to person, place, and time and well-developed, well-nourished, and in no distress.  HENT:  Head: Normocephalic and atraumatic.  Eyes:  Conjunctivae are normal. Pupils are equal, round, and reactive to light.  Neck: Neck supple.  Cardiovascular: Normal rate, regular rhythm, normal heart sounds and intact distal pulses.   Pulses:      Dorsalis pedis pulses are 2+ on the right side, and 2+ on the left side.       Posterior tibial pulses are 2+ on the right side, and 2+ on the left side.  Pulmonary/Chest: Effort normal and breath sounds normal. No respiratory distress. She has no wheezes. She has no rales. She exhibits no tenderness.  Musculoskeletal: Normal range of motion.       Right hand: Normal. Normal sensation noted. Normal strength noted.       Left hand: Normal. Normal sensation noted. Normal strength noted.       Right foot: Normal.       Left foot: Normal.  Lymphadenopathy:    She has no cervical adenopathy.  Neurological: She is alert and oriented to person, place, and time.  Skin: Skin is warm and dry. No rash noted.  Psychiatric: Affect normal.  Vitals reviewed.  Assessment/Plan: 1. Perimenopausal symptoms Will begin Effexor XR 37.5 mg daily to help with perimenopausal vasomotor symptoms and to help with anxiety/depression. Fu scheduled. - venlafaxine XR (EFFEXOR-XR) 37.5 MG 24 hr capsule; Take 1 capsule (37.5 mg total) by mouth daily with breakfast.  Dispense: 30 capsule; Refill: 3  2. Anxiety and depression Discussed treatment options including counseling and pharmacotherapy. Elects to begin medication. Will start Effexor Xr to help with mood and with perimenopausal symptoms. Will repeat TSH and obtain Free T4 today. Handout given on counseling services. Alarm signs/symptoms discussed that would prompt ER assessment. Patient voices understanding and agreement with plan. FU scheduled.  - venlafaxine XR (EFFEXOR-XR) 37.5 MG 24 hr capsule; Take 1 capsule (37.5 mg total) by mouth daily with breakfast.  Dispense: 30 capsule; Refill: 3 - TSH - T4, free  3. Neuropathy (HCC) Diabetic versus idiopathic. A1C  well-controlled. Sensation intact today. Will check labs to include b12 level. If low will start injections. If normal will consider addition of Gabapentin for diabetic neuropathy. FU with PCP scheduled. - CBC - Comp Met (CMET) - TSH - T4, free - C-reactive protein - B12   Leeanne Rio, PA-C

## 2016-06-26 ENCOUNTER — Encounter: Payer: Self-pay | Admitting: *Deleted

## 2016-07-19 ENCOUNTER — Encounter: Payer: Self-pay | Admitting: Family Medicine

## 2016-07-19 ENCOUNTER — Ambulatory Visit (INDEPENDENT_AMBULATORY_CARE_PROVIDER_SITE_OTHER): Payer: 59 | Admitting: Family Medicine

## 2016-07-19 VITALS — BP 106/74 | HR 72 | Temp 98.6°F | Ht 65.0 in | Wt 178.2 lb

## 2016-07-19 DIAGNOSIS — F419 Anxiety disorder, unspecified: Secondary | ICD-10-CM

## 2016-07-19 DIAGNOSIS — E1140 Type 2 diabetes mellitus with diabetic neuropathy, unspecified: Secondary | ICD-10-CM

## 2016-07-19 DIAGNOSIS — N951 Menopausal and female climacteric states: Secondary | ICD-10-CM | POA: Diagnosis not present

## 2016-07-19 DIAGNOSIS — F329 Major depressive disorder, single episode, unspecified: Secondary | ICD-10-CM

## 2016-07-19 DIAGNOSIS — F418 Other specified anxiety disorders: Secondary | ICD-10-CM | POA: Diagnosis not present

## 2016-07-19 DIAGNOSIS — G629 Polyneuropathy, unspecified: Secondary | ICD-10-CM | POA: Diagnosis not present

## 2016-07-19 DIAGNOSIS — F32A Depression, unspecified: Secondary | ICD-10-CM

## 2016-07-19 DIAGNOSIS — Z1211 Encounter for screening for malignant neoplasm of colon: Secondary | ICD-10-CM

## 2016-07-19 LAB — HEMOGLOBIN A1C: HEMOGLOBIN A1C: 6.2 % (ref 4.6–6.5)

## 2016-07-19 MED ORDER — GABAPENTIN 300 MG PO CAPS: 300.0000 mg | ORAL_CAPSULE | Freq: Every day | ORAL | 6 refills | Status: DC

## 2016-07-19 MED ORDER — METFORMIN HCL 1000 MG PO TABS: ORAL_TABLET | ORAL | 3 refills | Status: DC

## 2016-07-19 NOTE — Progress Notes (Signed)
[facility] [address], [address] [place], [place] [address] [phone]  Date:  07/19/2016   Name:  Sierra Lyons   DOB:  06/07/1961   MRN:  [id number]  PCP:  Abbe AmsterdamOPLAND,Yonas Bunda, MD    Chief Complaint: Follow-up (Pt here for 3-4 week f/u on diabetes. Declined flu vaccine. )   History of Present Illness:  Sierra Lyons is a 55 y.o. very pleasant female patient who presents with the following:  Here today for a follow-up visit of her DM and to follow-up depression/ vasomotor sx discussed on visit with Selena BattenCody about one month ago. She started effexor at that time.  Also did labs to eval any possible cause of tingling in her extremities.  Her B12 and thyroid labs were normal at that time. She is taking effexor; her mood and the hot flashes seem to be better.  Discussed going up on her dose but she does not feel this is necessary.   She notes less anxiety, overall she is quite pleased with how she has done with effexor She is taking metformin 1,000 BID.   Most recent A1c looked great The tingling in her hands is better- she still has some in her feet.  She would like to try taking medication for this.  Will try a low dose of gabapentin at bedtime She is not fasting today She is due for a mammogram, never done a colonoscopy- she would like to do cologuard instead Last eye exam a couple of years ago; she will update this soon  Wt Readings from Last 3 Encounters:  07/19/16 178 lb 3.2 oz (80.8 kg)  06/20/16 181 lb 6 oz (82.3 kg)  03/08/16 176 lb (79.8 kg)   . Lab Results  Component Value Date   HGBA1C 6.3 03/08/2016     Patient Active Problem List   Diagnosis Date Noted  . Overweight 01/05/2016  . Diabetes mellitus type 2, uncontrolled (HCC) 11/22/2015  . Moderate persistent asthma 09/07/2015  . Asthma with acute exacerbation 08/10/2015  . Allergic rhinitis 08/10/2015    Past Medical History:  Diagnosis Date  . Chronic  rhinitis around 1986    Past Surgical History:  Procedure Laterality Date  . CESAREAN SECTION  1993, 1997, 1999  . VEIN SURGERY  2012    Social History  Substance Use Topics  . Smoking status: Never Smoker  . Smokeless tobacco: Never Used  . Alcohol use No    Family History  Problem Relation Age of Onset  . Diabetes Mother   . Hypertension Mother   . Allergic rhinitis Father   . Allergic rhinitis Sister   . Asthma Sister   . Urticaria Sister   . Diabetes Maternal Grandmother   . Hypertension Maternal Grandmother     Allergies  Allergen Reactions  . Aspirin Swelling    Swelling of the face     Medication list has been reviewed and updated.  Current Outpatient Prescriptions on File Prior to Visit  Medication Sig Dispense Refill  . Albuterol Sulfate (PROAIR RESPICLICK) 108 (90 BASE) MCG/ACT AEPB Inhale 1-2 puffs into the lungs every 4 (four) hours as needed (for cough or wheeze). 1 each 1  . budesonide-formoterol (SYMBICORT) 80-4.5 MCG/ACT inhaler INHALE TWO PUFFS TWICE DAILY TO PREVENT COUGH OR WHEEZE. RINSE MOUTH AFTER USE. USE WITH SPACER. 1 Inhaler 5  . ibuprofen (ADVIL,MOTRIN) 200 MG tablet Take 200 mg by mouth every 6 (six) hours as needed.    .Marland Kitchen  metFORMIN (GLUCOPHAGE) 500 MG tablet TAKE 1 TABLET BY MOUTH EVERY MORNING AND 2 EVERY NIGHT AT BEDTIME FOR 1 WEEK, THEN 2 TABLETS TWICE DAILY (Patient taking differently: TAKE 2 TABLETS TWICE DAILY WITH MEALS) 120 tablet 3  . montelukast (SINGULAIR) 10 MG tablet Take 1 tablet (10 mg total) by mouth at bedtime. 30 tablet 5  . Multiple Vitamins-Minerals (CENTRUM ADULTS) TABS Take 1 tablet by mouth daily.    Marland Kitchen venlafaxine XR (EFFEXOR-XR) 37.5 MG 24 hr capsule Take 1 capsule (37.5 mg total) by mouth daily with breakfast. 30 capsule 3   No current facility-administered medications on file prior to visit.     Review of Systems:  As per HPI- otherwise negative. No fever, chills, nausea, vomiting, chest pain or SOB  Physical  Examination: Vitals:   07/19/16 0904  BP: 106/74  Pulse: 72  Temp: 98.6 F (37 C)   Vitals:   07/19/16 0904  Weight: 178 lb 3.2 oz (80.8 kg)  Height: 5\' 5"  (1.651 m)   Body mass index is 29.65 kg/m. Ideal Body Weight: Weight in (lb) to have BMI = 25: 149.9 GEN: WDWN, NAD, Non-toxic, A & O x 3, overweight, looks well HEENT: Atraumatic, Normocephalic. Neck supple. No masses, No LAD. Ears and Nose: No external deformity. CV: RRR, No M/G/R. No JVD. No thrill. No extra heart sounds. PULM: CTA B, no wheezes, crackles, rhonchi. No retractions. No resp. distress. No accessory muscle use. EXTR: No c/c/e NEURO Normal gait.  PSYCH: Normally interactive. Conversant. Not depressed or anxious appearing.  Calm demeanor.    Assessment and Plan: Controlled type 2 diabetes mellitus with diabetic neuropathy, without long-term current use of insulin (HCC) - Plan: metFORMIN (GLUCOPHAGE) 1000 MG tablet, Hemoglobin A1C  Perimenopausal symptoms  Anxiety and depression  Neuropathy (HCC) - Plan: gabapentin (NEURONTIN) 300 MG capsule  Special screening for malignant neoplasms, colon  Here today to follow-up on her DM and a couple of other concerns.   Did cologuard forms for her today Her anxiety is responding well to effexor, and her vasomotor sx are also improved.  She will continue her current dose- advised her that we should plan to treat for at least 6 months Will try a low dose of gabapentin at bedtime for her neuropathy   Signed Abbe Amsterdam, MD

## 2016-07-19 NOTE — Progress Notes (Signed)
Pre visit review using our clinic review tool, if applicable. No additional management support is needed unless otherwise documented below in the visit note. 

## 2016-07-19 NOTE — Patient Instructions (Signed)
It was great to see you today! We will get your set up for cologuard testing- this is at home home stool test for colon cancer Please do see your eye doctor and have a mammogram asap!  I changed your metformin to 1,000 mg tablets- just take 1 twice daily.  I will be in touch with your A1c Your blood pressure and weight look great!   Try an over the counter B vitamin/ B12 supplement; this may help with your nerve symptoms.    Let me know when you need more refills of your effexor, and keep me posted about your foot pains.  We will try gabapentin for your foot and legs pains, take at bedtime.  Let me know how this does for you!

## 2016-08-20 ENCOUNTER — Other Ambulatory Visit: Payer: Self-pay | Admitting: Physician Assistant

## 2016-08-20 DIAGNOSIS — F419 Anxiety disorder, unspecified: Secondary | ICD-10-CM

## 2016-08-20 DIAGNOSIS — N951 Menopausal and female climacteric states: Secondary | ICD-10-CM

## 2016-08-20 DIAGNOSIS — F32A Depression, unspecified: Secondary | ICD-10-CM

## 2016-08-20 DIAGNOSIS — F329 Major depressive disorder, single episode, unspecified: Secondary | ICD-10-CM

## 2016-08-21 ENCOUNTER — Other Ambulatory Visit: Payer: Self-pay | Admitting: Allergy and Immunology

## 2016-08-21 DIAGNOSIS — J45901 Unspecified asthma with (acute) exacerbation: Secondary | ICD-10-CM

## 2016-08-21 DIAGNOSIS — J3089 Other allergic rhinitis: Secondary | ICD-10-CM

## 2016-09-01 ENCOUNTER — Encounter: Payer: Self-pay | Admitting: Family Medicine

## 2016-09-01 LAB — COLOGUARD: Cologuard: NEGATIVE

## 2016-10-04 DIAGNOSIS — M5412 Radiculopathy, cervical region: Secondary | ICD-10-CM | POA: Diagnosis not present

## 2016-10-09 ENCOUNTER — Encounter: Payer: Self-pay | Admitting: Family Medicine

## 2016-10-09 ENCOUNTER — Ambulatory Visit (HOSPITAL_BASED_OUTPATIENT_CLINIC_OR_DEPARTMENT_OTHER)
Admission: RE | Admit: 2016-10-09 | Discharge: 2016-10-09 | Disposition: A | Discharge status: Home / Self Care | Payer: 59 | Source: Ambulatory Visit | Attending: Family Medicine | Admitting: Family Medicine

## 2016-10-09 ENCOUNTER — Ambulatory Visit (INDEPENDENT_AMBULATORY_CARE_PROVIDER_SITE_OTHER): Payer: 59 | Admitting: Family Medicine

## 2016-10-09 ENCOUNTER — Telehealth: Payer: Self-pay | Admitting: Family Medicine

## 2016-10-09 VITALS — BP 110/70 | HR 98 | Temp 98.2°F | Ht 65.0 in | Wt 177.4 lb

## 2016-10-09 DIAGNOSIS — R911 Solitary pulmonary nodule: Secondary | ICD-10-CM | POA: Diagnosis not present

## 2016-10-09 DIAGNOSIS — R079 Chest pain, unspecified: Secondary | ICD-10-CM | POA: Diagnosis not present

## 2016-10-09 DIAGNOSIS — E119 Type 2 diabetes mellitus without complications: Secondary | ICD-10-CM

## 2016-10-09 DIAGNOSIS — R29898 Other symptoms and signs involving the musculoskeletal system: Secondary | ICD-10-CM | POA: Insufficient documentation

## 2016-10-09 DIAGNOSIS — M47812 Spondylosis without myelopathy or radiculopathy, cervical region: Secondary | ICD-10-CM | POA: Diagnosis not present

## 2016-10-09 DIAGNOSIS — M79602 Pain in left arm: Secondary | ICD-10-CM

## 2016-10-09 LAB — POCT URINE PREGNANCY: Preg Test, Ur: NEGATIVE

## 2016-10-09 LAB — D-DIMER, QUANTITATIVE: D-Dimer, Quant: 0.35 mcg/mL FEU (ref <0.50)

## 2016-10-09 LAB — TROPONIN I: TNIDX: 0 ug/l (ref 0.00–0.06)

## 2016-10-09 NOTE — Patient Instructions (Signed)
I will be in touch with the rest of your labs We are going to do scans of your neck and chest- if anything concerning on your labs will have to go to the ER I will call you later this afternoon with an update

## 2016-10-09 NOTE — Telephone Encounter (Signed)
[facility] Day - Client TELEPHONE ADVICE RECORD [facility] Patient Name: Sierra SamLIZABETH Lyons DOB: 02/11/1961 Initial Comment Caller states, patient is having arm pain and chest pain with back pain and neck pain. Dizziness some days and stomach ache. Verified Nurse Assessment Nurse: Lane HackerHarley, RN, Elvin SoWindy Date/Time (Eastern Time): 10/09/2016 8:22:57 AM Confirm and document reason for call. If symptomatic, describe symptoms. ---Caller states she c/o having left arm pain and chest pain on left side. CP comes and goes with nausea and dizziness. Started thru the week. Along with upper back pain and neck pain. [facility] and told might be arthritis or pinched nerve. Does the patient have any new or worsening symptoms? ---Yes Will a triage be completed? ---Yes Related visit to physician within the last 2 weeks? ---Yes Does the PT have any chronic conditions? (i.e. diabetes, asthma, etc.) ---Yes List chronic conditions. ---asthma, dm Is the patient pregnant or possibly pregnant? (Ask all females between the ages of 7112-55) ---No Is this a behavioral health or substance abuse call? ---No Guidelines Guideline Title Affirmed Question Affirmed Notes Arm Pain Weakness (i.e., loss of strength) in hand or fingers (Exception: not truly weak; hand feels weak because of pain) Final Disposition User See Physician within 4 Hours (or PCP triage) Lane HackerHarley, RN, [name] Comments No CP now. 1:45 pm appt set by office, nothing sooner. Referrals REFERRED TO PCP OFFICE Disagree/Comply: Comply

## 2016-10-09 NOTE — Progress Notes (Signed)
[facility] [address], [address] [place], [place] [address] [phone]  Date:  10/09/2016   Name:  Sierra Lyons   DOB:  [birth year]   MRN:  [id number]  PCP:  Abbe Amsterdam, MD    Chief Complaint: Pain (c/o pain in neck, arm and chest and nausea. sx's started last Monday. Pt was seen at Urgent Care Jan.11th.) and Nausea   History of Present Illness:  Sierra Lyons is a 56 y.o. very pleasant female patient who presents with the following:  History of overweight, controlled DM, asthma Lab Results  Component Value Date   HGBA1C 6.2 07/19/2016    Here today with complaint of pain in her neck, lt arm and chest.  She first noted pain in her left arm one week ago.  The pain then went to under her left breast. It is not worse with exertion but has been constant.  She also had some pain in her left neck but this is now resolved She also has felt nauseated.  No vomiting but she "got close to throwing up" this am  She has not noted any GERD or burning in her chest She has not felt SOB No cough or fever.  No belly pain She did have this sort of pain under her breast a few months ago but it seemed to be MSK in origin- would be tender to touch.  This resolved She was seen at [facility] on 1/10 for these symptoms- she was treated with prednisone and flexeril at their office. However she did not take these meds as she was worried about SE/ worsening of her glucose control  LMP was several months ago- she is peri-menopausal she thinks.  Last bleed prior to most recent was several months ago  She notes that her left hand seems a bit weaker than normal since her arm started hurting- she has difficulty opening jars.  No other neurological  Sx, no unusual HA, no slurred speech, no facial drooping, difficulty swallowing or numbness in her body   She is not aware of any injury or activity that incited these sx At this time her neck  feels ok but she still has the ache in her arm and under the left breast   No history of CAD, never had a cardiac evaluation No current asthma exacerbation No syncope  Patient Active Problem List   Diagnosis Date Noted  . Overweight 01/05/2016  . Diabetes mellitus type 2, uncontrolled (HCC) 11/22/2015  . Moderate persistent asthma 09/07/2015  . Asthma with acute exacerbation 08/10/2015  . Allergic rhinitis 08/10/2015    Past Medical History:  Diagnosis Date  . Chronic rhinitis around 1986    Past Surgical History:  Procedure Laterality Date  . CESAREAN SECTION  1993, 1997, 1999  . VEIN SURGERY  2012    Social History  Substance Use Topics  . Smoking status: Never Smoker  . Smokeless tobacco: Never Used  . Alcohol use No    Family History  Problem Relation Age of Onset  . Diabetes Mother   . Hypertension Mother   . Allergic rhinitis Father   . Allergic rhinitis Sister   . Asthma Sister   . Urticaria Sister   . Diabetes Maternal Grandmother   . Hypertension Maternal Grandmother     Allergies  Allergen Reactions  . Aspirin Swelling    Swelling of the face     Medication list has been reviewed and updated.  Current Outpatient Prescriptions on File Prior to Visit  Medication Sig Dispense Refill  . Albuterol Sulfate (PROAIR RESPICLICK) 108 (90 BASE) MCG/ACT AEPB Inhale 1-2 puffs into the lungs every 4 (four) hours as needed (for cough or wheeze). 1 each 1  . budesonide-formoterol (SYMBICORT) 80-4.5 MCG/ACT inhaler INHALE TWO PUFFS TWICE DAILY TO PREVENT COUGH OR WHEEZE. RINSE MOUTH AFTER USE. USE WITH SPACER. 1 Inhaler 5  . gabapentin (NEURONTIN) 300 MG capsule Take 1 capsule (300 mg total) by mouth at bedtime. 30 capsule 6  . ibuprofen (ADVIL,MOTRIN) 200 MG tablet Take 200 mg by mouth every 6 (six) hours as needed.    . metFORMIN (GLUCOPHAGE) 1000 MG tablet TAKE 1 tablet TWICE DAILY WITH MEALS 180 tablet 3  . montelukast (SINGULAIR) 10 MG tablet Take 1 tablet (10  mg total) by mouth at bedtime. 30 tablet 5  . Multiple Vitamins-Minerals (CENTRUM ADULTS) TABS Take 1 tablet by mouth daily.    Marland Kitchen venlafaxine XR (EFFEXOR-XR) 37.5 MG 24 hr capsule TAKE 1 CAPSULE(37.5 MG) BY MOUTH DAILY WITH BREAKFAST 90 capsule 3   No current facility-administered medications on file prior to visit.     Review of Systems:  As per HPI- otherwise negative.   Physical Examination: Vitals:   10/09/16 1355  BP: 110/70  Pulse: 98  Temp: 98.2 F (36.8 C)   Vitals:   10/09/16 1355  Weight: 177 lb 6.4 oz (80.5 kg)  Height: 5\' 5"  (1.651 m)   Body mass index is 29.52 kg/m. Ideal Body Weight: Weight in (lb) to have BMI = 25: 149.9  GEN: WDWN, NAD, Non-toxic, A & O x 3, looks well, overweight HEENT: Atraumatic, Normocephalic. Neck supple. No masses, No LAD.  Bilateral TM wnl, oropharynx normal.  PEERL,EOMI.   No cervical spine TTP, negative Spurling, normal cervical ROM Ears and Nose: No external deformity. CV: RRR, No M/G/R. No JVD. No thrill. No extra heart sounds. She indicates a tenderness over her left lateral ribs inferior to the breast.  This tenderness is reproducible on palpation PULM: CTA B, no wheezes, crackles, rhonchi. No retractions. No resp. distress. No accessory muscle use. ABD: S, NT, ND. No rebound. No HSM. EXTR: No c/c/e NEURO Normal gait. Normal strength, sensation and DTR of all limbs except she does have reduced (4- 4+/5) grip strength in her left hand.   Normal romberg.   Normal ROM of the left arm and shoulder, no redness, heat or tenderness  PSYCH: Normally interactive. Conversant. Not depressed or anxious appearing.  Calm demeanor.   EKG:  NSR, no ST changes of concern  Dg Chest 2 View  Result Date: 10/09/2016 CLINICAL DATA:  Chest pain. EXAM: CHEST  2 VIEW COMPARISON:  No recent prior . FINDINGS: Mediastinum and hilar structures are normal. Lungs are clear. Calcified nodule noted in the right lung consistent with granuloma. No pleural  effusion or pneumothorax. IMPRESSION: No acute or focal abnormality identified. Calcified pulmonary nodule right lung consistent granuloma. Electronically Signed   By: Maisie Fus  Register   On: 10/09/2016 14:36   Dg Cervical Spine Complete  Result Date: 10/09/2016 CLINICAL DATA:  56 year old female with recent onset unexplained left upper extremity pain later radiating to the neck and chest. Mild left upper extremity weakness. Initial encounter. EXAM: CERVICAL SPINE - COMPLETE 4+ VIEW COMPARISON:  Chest radiographs today reported separately. FINDINGS: Straightening of cervical lordosis. Normal prevertebral soft tissue contour. Mild disc space loss and endplate spurring at C5-C6. Bilateral posterior element alignment is within normal limits. Normal  AP alignment. Lung apices appear normal. Negative C1-C2 alignment. Cervicothoracic junction alignment is within normal limits. IMPRESSION: No acute osseous abnormality in the cervical spine. Mild for age lower cervical disc and endplate degeneration. Electronically Signed   By: Odessa FlemingH  Hall M.D.   On: 10/09/2016 15:12    Results for orders placed or performed in visit on 10/09/16  D-Dimer, Quantitative  Result Value Ref Range   D-Dimer, Quant 0.35 <0.50 mcg/mL FEU  Troponin I  Result Value Ref Range   TNIDX 0.00 0.00 - 0.06 ug/l  POCT urine pregnancy  Result Value Ref Range   Preg Test, Ur Negative Negative    Assessment and Plan: Chest pain, unspecified type - Plan: EKG 12-Lead, DG Chest 2 View, D-Dimer, Quantitative, Troponin I, POCT urine pregnancy, CT Chest W Contrast  Left arm pain - Plan: DG Cervical Spine Complete, CBC, Sedimentation rate, CT Soft Tissue Neck W Contrast, CT Chest W Contrast  Left hand weakness - Plan: DG Cervical Spine Complete, CT Soft Tissue Neck W Contrast  Controlled type 2 diabetes mellitus without complication, without long-term current use of insulin (HCC) - Plan: Comprehensive metabolic panel, Hemoglobin A1c  Here today  with concern of pain in her left arm, mild left hand weakness and pain in her left chest for one week.  Duration of symptoms and her benign presentation argues against a dangerous etiology but will promptly work-up given potentially serious nature of her sx Start with labs as above, and plan for CT of her neck and chest.  Will need a CT angio of her chest if D dimer is positive; discussed with radiology and an angiogram may be problematic given a current widespread shortage of injection syringe used for this study. Received her D dimer and troponin- called to let pt know these are normal.  Plan for her CT scans tomorrow. Will not need a chest angiogram at this time thankfully She is cautioned to go to the ER or otherwise seek emergency care if she has any change or worsening of her sx at home    Signed Abbe AmsterdamJessica Copland, MD

## 2016-10-09 NOTE — Progress Notes (Signed)
Pre visit review using our clinic review tool, if applicable. No additional management support is needed unless otherwise documented below in the visit note. 

## 2016-10-10 ENCOUNTER — Telehealth: Payer: Self-pay

## 2016-10-10 ENCOUNTER — Ambulatory Visit (HOSPITAL_BASED_OUTPATIENT_CLINIC_OR_DEPARTMENT_OTHER)
Admission: RE | Admit: 2016-10-10 | Discharge: 2016-10-10 | Disposition: A | Discharge status: Home / Self Care | Payer: 59 | Source: Ambulatory Visit | Attending: Family Medicine | Admitting: Family Medicine

## 2016-10-10 ENCOUNTER — Ambulatory Visit (HOSPITAL_BASED_OUTPATIENT_CLINIC_OR_DEPARTMENT_OTHER): Admission: RE | Admit: 2016-10-10 | Payer: 59 | Source: Ambulatory Visit

## 2016-10-10 ENCOUNTER — Encounter (HOSPITAL_BASED_OUTPATIENT_CLINIC_OR_DEPARTMENT_OTHER): Payer: Self-pay

## 2016-10-10 ENCOUNTER — Encounter: Payer: Self-pay | Admitting: Family Medicine

## 2016-10-10 DIAGNOSIS — R079 Chest pain, unspecified: Secondary | ICD-10-CM | POA: Insufficient documentation

## 2016-10-10 DIAGNOSIS — M79602 Pain in left arm: Secondary | ICD-10-CM

## 2016-10-10 DIAGNOSIS — K110 Atrophy of salivary gland: Secondary | ICD-10-CM | POA: Insufficient documentation

## 2016-10-10 DIAGNOSIS — R29898 Other symptoms and signs involving the musculoskeletal system: Secondary | ICD-10-CM

## 2016-10-10 DIAGNOSIS — D869 Sarcoidosis, unspecified: Secondary | ICD-10-CM | POA: Insufficient documentation

## 2016-10-10 DIAGNOSIS — M3500 Sicca syndrome, unspecified: Secondary | ICD-10-CM | POA: Diagnosis not present

## 2016-10-10 DIAGNOSIS — M542 Cervicalgia: Secondary | ICD-10-CM | POA: Diagnosis not present

## 2016-10-10 HISTORY — DX: Type 2 diabetes mellitus without complications: E11.9

## 2016-10-10 HISTORY — DX: Unspecified asthma, uncomplicated: J45.909

## 2016-10-10 LAB — COMPREHENSIVE METABOLIC PANEL
ALBUMIN: 4.4 g/dL (ref 3.5–5.2)
ALK PHOS: 41 U/L (ref 39–117)
ALT: 12 U/L (ref 0–35)
AST: 16 U/L (ref 0–37)
BILIRUBIN TOTAL: 0.3 mg/dL (ref 0.2–1.2)
BUN: 20 mg/dL (ref 6–23)
CO2: 27 mEq/L (ref 19–32)
Calcium: 10.4 mg/dL (ref 8.4–10.5)
Chloride: 100 mEq/L (ref 96–112)
Creatinine, Ser: 0.77 mg/dL (ref 0.40–1.20)
GFR: 83.23 mL/min (ref >60.00)
Glucose, Bld: 76 mg/dL (ref 70–99)
POTASSIUM: 4.4 meq/L (ref 3.5–5.1)
SODIUM: 137 meq/L (ref 135–145)
TOTAL PROTEIN: 7.5 g/dL (ref 6.0–8.3)

## 2016-10-10 LAB — CBC
HEMATOCRIT: 41.5 % (ref 36.0–46.0)
HEMOGLOBIN: 14.2 g/dL (ref 12.0–15.0)
MCHC: 34.2 g/dL (ref 30.0–36.0)
MCV: 89.7 fl (ref 78.0–100.0)
Platelets: 285 10*3/uL (ref 150.0–400.0)
RBC: 4.62 Mil/uL (ref 3.87–5.11)
RDW: 14.1 % (ref 11.5–15.5)
WBC: 7.7 10*3/uL (ref 4.0–10.5)

## 2016-10-10 LAB — SEDIMENTATION RATE: Sed Rate: 8 mm/hr (ref 0–30)

## 2016-10-10 LAB — HEMOGLOBIN A1C: Hgb A1c MFr Bld: 6.6 % — ABNORMAL HIGH (ref 4.6–6.5)

## 2016-10-10 MED ORDER — IOPAMIDOL (ISOVUE-300) INJECTION 61%
100.0000 mL | Freq: Once | INTRAVENOUS | Status: AC | PRN
  Administered 2016-10-10: 80 mL via INTRAVENOUS

## 2016-10-10 NOTE — Telephone Encounter (Signed)
Called pt- she did not cancel scans.  Will have today at 4pm

## 2016-10-10 NOTE — Telephone Encounter (Signed)
-----   Message from Oneal GroutJennifer S Sebastian sent at 10/10/2016  1:01 PM EST ----- Was scheduled for today, looks like patient canceled them.  ----- Message ----- From: Pearline CablesJessica C Kiarrah Rausch, MD Sent: 10/10/2016  12:29 PM To: Oneal GroutJennifer S Sebastian  Any luck on setting up her scans?  Thank you!

## 2016-10-10 NOTE — Telephone Encounter (Signed)
Follow up call made to patient. States she was ordered CT and is waiting for call from imaging for CT. States she has been having symptoms for about a week and would rather have CT than go to ED at this time. CT waiting for results of BUN and Creatinine. Lab called to see what the status is and [facility]. Will run now and have results in 20-30 minutes. Patient notified Imaging notified as well.

## 2016-10-16 ENCOUNTER — Telehealth: Payer: Self-pay | Admitting: Family Medicine

## 2016-10-16 NOTE — Telephone Encounter (Signed)
Patient called stating that she has not been feeling well and was told to inform Dr. Patsy Lageropland if she started feeling bad. She would not give me any more information. Please advise   Phone: [phone]

## 2016-10-17 NOTE — Telephone Encounter (Signed)
Tried to contact pt to get more information. No answer, left voicemail for pt.

## 2016-10-17 NOTE — Telephone Encounter (Signed)
Gave her a call- she is using ibuprofen which is helping but she still has the pain into her left arm and neck. She still has flexeril and pred from the UC that she never used.  She will use these meds- cautioned that her glucose will go up temporarily, and to avoid NSAIDs with pred. She will let me know if this is not helpful for her

## 2016-10-17 NOTE — Telephone Encounter (Signed)
Pt called back to inform that she is still having left arm pain that radiates under left breast. Pt states that the only thing that helps is to take 4 ibuprofen every 8 hrs. Pt states that if she does not take the ibuprofen the pain does get worse. Please advise.

## 2016-10-17 NOTE — Telephone Encounter (Signed)
Patient is calling to follow up on this. Please advise.  °

## 2016-10-24 ENCOUNTER — Other Ambulatory Visit: Payer: Self-pay | Admitting: Physician Assistant

## 2016-10-24 DIAGNOSIS — F32A Depression, unspecified: Secondary | ICD-10-CM

## 2016-10-24 DIAGNOSIS — F419 Anxiety disorder, unspecified: Secondary | ICD-10-CM

## 2016-10-24 DIAGNOSIS — N951 Menopausal and female climacteric states: Secondary | ICD-10-CM

## 2016-10-24 DIAGNOSIS — F329 Major depressive disorder, single episode, unspecified: Secondary | ICD-10-CM

## 2016-12-04 ENCOUNTER — Encounter: Payer: Self-pay | Admitting: Family Medicine

## 2017-02-27 ENCOUNTER — Other Ambulatory Visit: Payer: Self-pay | Admitting: Family Medicine

## 2017-02-27 DIAGNOSIS — G629 Polyneuropathy, unspecified: Secondary | ICD-10-CM

## 2017-04-07 LAB — GLUCOSE, POCT (MANUAL RESULT ENTRY): POC GLUCOSE: 93 mg/dL (ref 70–99)

## 2017-04-09 ENCOUNTER — Other Ambulatory Visit: Payer: Self-pay | Admitting: Family Medicine

## 2017-04-09 DIAGNOSIS — G629 Polyneuropathy, unspecified: Secondary | ICD-10-CM

## 2017-05-05 ENCOUNTER — Other Ambulatory Visit: Payer: Self-pay | Admitting: Family Medicine

## 2017-05-05 DIAGNOSIS — G629 Polyneuropathy, unspecified: Secondary | ICD-10-CM

## 2017-07-10 ENCOUNTER — Other Ambulatory Visit: Payer: Self-pay | Admitting: Emergency Medicine

## 2017-07-10 DIAGNOSIS — G629 Polyneuropathy, unspecified: Secondary | ICD-10-CM

## 2017-07-16 ENCOUNTER — Telehealth: Payer: Self-pay | Admitting: Emergency Medicine

## 2017-07-16 DIAGNOSIS — G629 Polyneuropathy, unspecified: Secondary | ICD-10-CM

## 2017-07-23 NOTE — Telephone Encounter (Signed)
GABAPENTIN [facility] [address] Per pt they are awaiting refill auth.

## 2017-07-24 ENCOUNTER — Other Ambulatory Visit: Payer: Self-pay | Admitting: Emergency Medicine

## 2017-07-24 DIAGNOSIS — G629 Polyneuropathy, unspecified: Secondary | ICD-10-CM

## 2017-07-24 MED ORDER — GABAPENTIN 300 MG PO CAPS: 300.0000 mg | ORAL_CAPSULE | Freq: Every day | ORAL | 0 refills | Status: DC

## 2017-07-24 NOTE — Telephone Encounter (Signed)
Refill sent to pharmacy per pt request.

## 2017-08-04 NOTE — Progress Notes (Addendum)
[facility] at [facility] [place], [place], [place] [address] [phone]  Date:  08/06/2017   Name:  Sierra Lyons   DOB:  [birth year]   MRN:  [id number]  PCP:  Darreld Mclean, MD    Chief Complaint: Follow-up   History of Present Illness:  Sierra Lyons is a 56 y.o. very pleasant female patient who presents with the following:  Recheck visit today- history of overweight, DM Lab Results  Component Value Date   HGBA1C 6.6 (H) 10/09/2016   I last saw her for a diabetes check about a year ago In January we met due to her having left arm pain  A1c due Eye exam: about a year ago Foot exam due Urine micro due Flu due- will give to her today Mammogram: she is due and will set this up asap.  Will try to do today She had her pneumovax last year  She had a negative cologuard 08/24/16  She does check her glucose at home- it has been a bit higher than is normal for her lately   Her family is well She feels like she has been a bit depressed and has noted low energy. She has noted this more so over the last 3-4 weeks She feels like she has low energy She is not sleeping that well She notes sx of menopause- hot flashes, but these are getting a bit better  She is on effexor but only 37.5 mg- she would be willing to increase the dose No SI  Patient Active Problem List   Diagnosis Date Noted  . Overweight 01/05/2016  . Diabetes mellitus type 2, uncontrolled ([place]) 11/22/2015  . Moderate persistent asthma 09/07/2015  . Allergic rhinitis 08/10/2015    Past Medical History:  Diagnosis Date  . Asthma   . Chronic rhinitis around 1986  . Diabetes mellitus without complication [facility])     Past Surgical History:  Procedure Laterality Date  . [place]  . VEIN SURGERY  2012    Social History   Tobacco Use  . Smoking status: Never Smoker  . Smokeless tobacco: Never Used  Substance Use  Topics  . Alcohol use: No  . Drug use: No    Family History  Problem Relation Age of Onset  . Diabetes Mother   . Hypertension Mother   . Allergic rhinitis Father   . Allergic rhinitis Sister   . Asthma Sister   . Urticaria Sister   . Diabetes Maternal Grandmother   . Hypertension Maternal Grandmother     Allergies  Allergen Reactions  . Aspirin Swelling    Swelling of the face     Medication list has been reviewed and updated.  Current Outpatient Medications on File Prior to Visit  Medication Sig Dispense Refill  . Albuterol Sulfate (PROAIR RESPICLICK) 983 (90 BASE) MCG/ACT AEPB Inhale 1-2 puffs into the lungs every 4 (four) hours as needed (for cough or wheeze). 1 each 1  . budesonide-formoterol (SYMBICORT) 80-4.5 MCG/ACT inhaler INHALE TWO PUFFS TWICE DAILY TO PREVENT COUGH OR WHEEZE. RINSE MOUTH AFTER USE. USE WITH SPACER. 1 Inhaler 5  . gabapentin (NEURONTIN) 300 MG capsule Take 1 capsule (300 mg total) by mouth at bedtime. 30 capsule 0  . ibuprofen (ADVIL,MOTRIN) 200 MG tablet Take 200 mg by mouth every 6 (six) hours as needed.    . metFORMIN (GLUCOPHAGE) 1000 MG tablet TAKE 1 tablet TWICE DAILY WITH MEALS  180 tablet 3  . montelukast (SINGULAIR) 10 MG tablet Take 1 tablet (10 mg total) by mouth at bedtime. 30 tablet 5  . Multiple Vitamins-Minerals (CENTRUM ADULTS) TABS Take 1 tablet by mouth daily.    Marland Kitchen venlafaxine XR (EFFEXOR-XR) 37.5 MG 24 hr capsule TAKE 1 CAPSULE(37.5 MG) BY MOUTH DAILY WITH BREAKFAST 90 capsule 3   No current facility-administered medications on file prior to visit.     Review of Systems:  As per HPI- otherwise negative. No fever or chills No CP or SOB No nausea, vomiting or diarrhea No rash Menopausal   Physical Examination: Vitals:   08/06/17 1520  BP: 108/70  Pulse: 95  Temp: 98.6 F (37 C)  SpO2: 94%   Vitals:   08/06/17 1520  Weight: 179 lb (81.2 kg)  Height: '5\' 6"'$  (1.676 m)   Body mass index is 28.89 kg/m. Ideal Body  Weight: Weight in (lb) to have BMI = 25: 154.6  GEN: WDWN, NAD, Non-toxic, A & O x 3, overweight, looks well HEENT: Atraumatic, Normocephalic. Neck supple. No masses, No LAD. Bilateral TM wnl, oropharynx normal.  PEERL,EOMI.   Ears and Nose: No external deformity. CV: RRR, No M/G/R. No JVD. No thrill. No extra heart sounds. PULM: CTA B, no wheezes, crackles, rhonchi. No retractions. No resp. distress. No accessory muscle use. ABD: S, NT, ND EXTR: No c/c/e NEURO Normal gait.  PSYCH: Normally interactive. Conversant. Not depressed or anxious appearing.  Calm demeanor.  Normal foot exam today   Assessment and Plan: Controlled type 2 diabetes mellitus with diabetic neuropathy, without long-term current use of insulin ([place]) - Plan: Comprehensive metabolic panel, Hemoglobin A1c, Microalbumin / creatinine urine ratio  Perimenopausal symptoms - Plan: venlafaxine XR (EFFEXOR-XR) 75 MG 24 hr capsule  Anxiety and depression - Plan: venlafaxine XR (EFFEXOR-XR) 75 MG 24 hr capsule  Other fatigue - Plan: CBC, TSH  Screening for deficiency anemia - Plan: CBC  Screening for hyperlipidemia - Plan: Lipid panel  Here today for a follow-up visit Dm labs pending as above encouraged a mammogram and eye exam She feels like her mood and energy are not as good- may be related to the season.  Will increase her effexor, she will let me know how this works for her Will plan further follow- up pending labs.   Signed Lamar Blinks, MD 11/14 Received her labs and gave her a call.  Left detailed message- cholesterol and A1c are not at goal.  Will send her a letter in the mail with more details   Results for orders placed or performed in visit on 08/06/17  CBC  Result Value Ref Range   WBC 6.1 4.0 - 10.5 K/uL   RBC 4.36 3.87 - 5.11 Mil/uL   Platelets 284.0 150.0 - 400.0 K/uL   Hemoglobin 13.4 12.0 - 15.0 g/dL   HCT 40.9 36.0 - 46.0 %   MCV 93.8 78.0 - 100.0 fl   MCHC 32.8 30.0 - 36.0 g/dL   RDW  13.8 11.5 - 15.5 %  Comprehensive metabolic panel  Result Value Ref Range   Sodium 137 135 - 145 mEq/L   Potassium 4.0 3.5 - 5.1 mEq/L   Chloride 101 96 - 112 mEq/L   CO2 29 19 - 32 mEq/L   Glucose, Bld 194 (H) 70 - 99 mg/dL   BUN 17 6 - 23 mg/dL   Creatinine, Ser 0.75 0.40 - 1.20 mg/dL   Total Bilirubin 0.2 0.2 - 1.2 mg/dL   Alkaline Phosphatase 41  39 - 117 U/L   AST 14 0 - 37 U/L   ALT 10 0 - 35 U/L   Total Protein 7.2 6.0 - 8.3 g/dL   Albumin 4.2 3.5 - 5.2 g/dL   Calcium 9.6 8.4 - 10.5 mg/dL   GFR 85.53 >60.00 mL/min  Hemoglobin A1c  Result Value Ref Range   Hgb A1c MFr Bld 8.0 (H) 4.6 - 6.5 %  Lipid panel  Result Value Ref Range   Cholesterol 222 (H) 0 - 200 mg/dL   Triglycerides 207.0 (H) 0.0 - 149.0 mg/dL   HDL 63.00 >39.00 mg/dL   VLDL 41.4 (H) 0.0 - 40.0 mg/dL   Total CHOL/HDL Ratio 4    NonHDL 159.00   TSH  Result Value Ref Range   TSH 1.15 0.35 - 4.50 uIU/mL  LDL cholesterol, direct  Result Value Ref Range   Direct LDL 120.0 mg/dL

## 2017-08-06 ENCOUNTER — Encounter: Payer: Self-pay | Admitting: Family Medicine

## 2017-08-06 ENCOUNTER — Ambulatory Visit (INDEPENDENT_AMBULATORY_CARE_PROVIDER_SITE_OTHER): Payer: 59 | Admitting: Family Medicine

## 2017-08-06 ENCOUNTER — Other Ambulatory Visit: Payer: Self-pay | Admitting: Family Medicine

## 2017-08-06 VITALS — BP 108/70 | HR 95 | Temp 98.6°F | Ht 66.0 in | Wt 179.0 lb

## 2017-08-06 DIAGNOSIS — Z23 Encounter for immunization: Secondary | ICD-10-CM

## 2017-08-06 DIAGNOSIS — F329 Major depressive disorder, single episode, unspecified: Secondary | ICD-10-CM

## 2017-08-06 DIAGNOSIS — R5383 Other fatigue: Secondary | ICD-10-CM | POA: Diagnosis not present

## 2017-08-06 DIAGNOSIS — Z130 Encounter for screening for diseases of the blood and blood-forming organs and certain disorders involving the immune mechanism: Secondary | ICD-10-CM

## 2017-08-06 DIAGNOSIS — F419 Anxiety disorder, unspecified: Secondary | ICD-10-CM | POA: Diagnosis not present

## 2017-08-06 DIAGNOSIS — Z1231 Encounter for screening mammogram for malignant neoplasm of breast: Secondary | ICD-10-CM

## 2017-08-06 DIAGNOSIS — Z13220 Encounter for screening for lipoid disorders: Secondary | ICD-10-CM

## 2017-08-06 DIAGNOSIS — N951 Menopausal and female climacteric states: Secondary | ICD-10-CM

## 2017-08-06 DIAGNOSIS — E1140 Type 2 diabetes mellitus with diabetic neuropathy, unspecified: Secondary | ICD-10-CM

## 2017-08-06 DIAGNOSIS — F32A Depression, unspecified: Secondary | ICD-10-CM

## 2017-08-06 MED ORDER — VENLAFAXINE HCL ER 75 MG PO CP24: 75.0000 mg | ORAL_CAPSULE | Freq: Every day | ORAL | 3 refills | Status: DC

## 2017-08-06 NOTE — Patient Instructions (Signed)
It was nice to see you again today!   Please get a mammogram asap- you may be able to get this today on the ground floor.  Please get your eye exam done soon We will check your labs today, and we increased your effexor to 75 mg.  Please check in with me in a month or so and let me know how you feel

## 2017-08-07 ENCOUNTER — Encounter (HOSPITAL_BASED_OUTPATIENT_CLINIC_OR_DEPARTMENT_OTHER): Payer: Self-pay

## 2017-08-07 ENCOUNTER — Ambulatory Visit (HOSPITAL_BASED_OUTPATIENT_CLINIC_OR_DEPARTMENT_OTHER)
Admission: RE | Admit: 2017-08-07 | Discharge: 2017-08-07 | Disposition: A | Discharge status: Home / Self Care | Payer: 59 | Source: Ambulatory Visit | Attending: Family Medicine | Admitting: Family Medicine

## 2017-08-07 DIAGNOSIS — Z1231 Encounter for screening mammogram for malignant neoplasm of breast: Secondary | ICD-10-CM | POA: Diagnosis not present

## 2017-08-07 LAB — LDL CHOLESTEROL, DIRECT: Direct LDL: 120 mg/dL

## 2017-08-07 LAB — COMPREHENSIVE METABOLIC PANEL
ALK PHOS: 41 U/L (ref 39–117)
ALT: 10 U/L (ref 0–35)
AST: 14 U/L (ref 0–37)
Albumin: 4.2 g/dL (ref 3.5–5.2)
BUN: 17 mg/dL (ref 6–23)
CHLORIDE: 101 meq/L (ref 96–112)
CO2: 29 mEq/L (ref 19–32)
Calcium: 9.6 mg/dL (ref 8.4–10.5)
Creatinine, Ser: 0.75 mg/dL (ref 0.40–1.20)
GFR: 85.53 mL/min (ref >60.00)
GLUCOSE: 194 mg/dL — AB (ref 70–99)
POTASSIUM: 4 meq/L (ref 3.5–5.1)
SODIUM: 137 meq/L (ref 135–145)
TOTAL PROTEIN: 7.2 g/dL (ref 6.0–8.3)
Total Bilirubin: 0.2 mg/dL (ref 0.2–1.2)

## 2017-08-07 LAB — CBC
HEMATOCRIT: 40.9 % (ref 36.0–46.0)
HEMOGLOBIN: 13.4 g/dL (ref 12.0–15.0)
MCHC: 32.8 g/dL (ref 30.0–36.0)
MCV: 93.8 fl (ref 78.0–100.0)
Platelets: 284 10*3/uL (ref 150.0–400.0)
RBC: 4.36 Mil/uL (ref 3.87–5.11)
RDW: 13.8 % (ref 11.5–15.5)
WBC: 6.1 10*3/uL (ref 4.0–10.5)

## 2017-08-07 LAB — LIPID PANEL
CHOLESTEROL: 222 mg/dL — AB (ref 0–200)
HDL: 63 mg/dL (ref >39.00)
NonHDL: 159
TRIGLYCERIDES: 207 mg/dL — AB (ref 0.0–149.0)
Total CHOL/HDL Ratio: 4
VLDL: 41.4 mg/dL — AB (ref 0.0–40.0)

## 2017-08-07 LAB — HEMOGLOBIN A1C: HEMOGLOBIN A1C: 8 % — AB (ref 4.6–6.5)

## 2017-08-07 LAB — TSH: TSH: 1.15 u[IU]/mL (ref 0.35–4.50)

## 2017-08-14 ENCOUNTER — Other Ambulatory Visit: Payer: Self-pay | Admitting: Family Medicine

## 2017-08-14 DIAGNOSIS — G629 Polyneuropathy, unspecified: Secondary | ICD-10-CM

## 2017-08-15 ENCOUNTER — Other Ambulatory Visit: Payer: Self-pay | Admitting: Emergency Medicine

## 2017-08-15 DIAGNOSIS — E1140 Type 2 diabetes mellitus with diabetic neuropathy, unspecified: Secondary | ICD-10-CM

## 2017-08-15 MED ORDER — METFORMIN HCL 1000 MG PO TABS: ORAL_TABLET | ORAL | 3 refills | Status: DC

## 2017-09-21 ENCOUNTER — Other Ambulatory Visit: Payer: Self-pay | Admitting: Family Medicine

## 2017-09-21 DIAGNOSIS — G629 Polyneuropathy, unspecified: Secondary | ICD-10-CM

## 2017-11-25 ENCOUNTER — Other Ambulatory Visit: Payer: Self-pay | Admitting: Family Medicine

## 2017-11-25 DIAGNOSIS — G629 Polyneuropathy, unspecified: Secondary | ICD-10-CM

## 2017-11-28 ENCOUNTER — Ambulatory Visit (INDEPENDENT_AMBULATORY_CARE_PROVIDER_SITE_OTHER): Payer: 59 | Admitting: Psychology

## 2017-11-28 DIAGNOSIS — F4323 Adjustment disorder with mixed anxiety and depressed mood: Secondary | ICD-10-CM | POA: Diagnosis not present

## 2018-01-04 ENCOUNTER — Ambulatory Visit (INDEPENDENT_AMBULATORY_CARE_PROVIDER_SITE_OTHER): Payer: 59 | Admitting: Psychology

## 2018-01-04 DIAGNOSIS — F4323 Adjustment disorder with mixed anxiety and depressed mood: Secondary | ICD-10-CM | POA: Diagnosis not present

## 2018-02-01 ENCOUNTER — Ambulatory Visit: Payer: 59 | Admitting: Psychology

## 2018-02-03 DIAGNOSIS — L500 Allergic urticaria: Secondary | ICD-10-CM | POA: Diagnosis not present

## 2018-03-12 ENCOUNTER — Ambulatory Visit (INDEPENDENT_AMBULATORY_CARE_PROVIDER_SITE_OTHER): Payer: 59 | Admitting: Psychology

## 2018-03-12 DIAGNOSIS — F4323 Adjustment disorder with mixed anxiety and depressed mood: Secondary | ICD-10-CM | POA: Diagnosis not present

## 2018-03-24 ENCOUNTER — Other Ambulatory Visit: Payer: Self-pay | Admitting: Family Medicine

## 2018-03-24 DIAGNOSIS — G629 Polyneuropathy, unspecified: Secondary | ICD-10-CM

## 2018-04-24 NOTE — Progress Notes (Addendum)
[facility] [address], [address] [place], [place] [address] [phone] [phone]  Date:  04/25/2018   Name:  Sierra Lyons   DOB:  07/14/1961   MRN:  [id number]  PCP:  Pearline Cablesopland, Vignesh Willert C, MD    Chief Complaint: Fatigue and Anxiety (trouble sleeping, left brest pain and back 4 weeks when stopping to rest)   History of Present Illness:  Sierra Lyons is a 57 y.o. very pleasant female patient who presents with the following:  Last seen by myself in November at which time we increased her effexor dose for depression sx.  She has a history of DM, asthma  Lab Results  Component Value Date   HGBA1C 8.0 (H) 08/06/2017   Per my lab notes in November  Your blood count looks fine.   Your A1c however has gone up- it was 6.6 in January.  We might consider adding a mediation to your regimen to help bring this down.  Alternatively, you can work on diet and exercise and we can recheck in 3 months.  Please let me know your preference here! Your cholesterol is not terrible, but also not quite as good as I would like to see.  We could consider starting a low dose of a cholesterol med to improve these numbers.  If you prefer the diet and exercise route, we can recheck along with an A1c in about 3 months.  Please let me know what you prefer to do and take care!   I had asked her to return to see me in 3 months at that time but she did not appear until now   Eye exam: a1c is due- get today microalbumin due- get today  She notes that she is feeling more anxious as of late- over the last couple of weeks esp she has felt more anxious. However she also states that she has good reason to be feeling anxious- She is moving to a new home, and is leaving her son with special needs (he is 19yo) to live in her home and get his social services there. She is not sure where she will go- this will all happen in December. They just finalized these plans  She also has  noted left discomfort pain for a month or so- she describes this as a "weird sensation." it is not really a pain, she is not able to describe what it feels like. It will come and go- she notes it more when she is at rest.  Esp will notice this when she is laying in bed at night Discomfort is not exertional She does feel like it is external and in the breast as opposed to her chest   She has noted an intermitent pain in her left upper back that occurs maybe once a day. It lasts for a few minutes She is not aware of this following any pattern either She has noted this for a month or so as well  She sees her asthma and allergy doctor soon  mammo 11/18- normal No sx of high or low glucose noted  She is taking metformin Gabapentin singulair effexor proair prn symbicort   Patient Active Problem List   Diagnosis Date Noted  . Overweight 01/05/2016  . Diabetes mellitus type 2, uncontrolled (HCC) 11/22/2015  . Moderate persistent asthma 09/07/2015  . Allergic rhinitis 08/10/2015    Past Medical History:  Diagnosis Date  . Asthma   . Chronic rhinitis around  1986  . Diabetes mellitus without complication [facility])     Past Surgical History:  Procedure Laterality Date  . CESAREAN SECTION  1993, 1997, 1999  . VEIN SURGERY  2012    Social History   Tobacco Use  . Smoking status: Never Smoker  . Smokeless tobacco: Never Used  Substance Use Topics  . Alcohol use: No  . Drug use: No    Family History  Problem Relation Age of Onset  . Diabetes Mother   . Hypertension Mother   . Allergic rhinitis Father   . Allergic rhinitis Sister   . Asthma Sister   . Urticaria Sister   . Diabetes Maternal Grandmother   . Hypertension Maternal Grandmother     Allergies  Allergen Reactions  . Aspirin Swelling    Swelling of the face     Medication list has been reviewed and updated.  Current Outpatient Medications on File Prior to Visit  Medication Sig Dispense Refill  . Albuterol  Sulfate (PROAIR RESPICLICK) 108 (90 BASE) MCG/ACT AEPB Inhale 1-2 puffs into the lungs every 4 (four) hours as needed (for cough or wheeze). 1 each 1  . budesonide-formoterol (SYMBICORT) 80-4.5 MCG/ACT inhaler INHALE TWO PUFFS TWICE DAILY TO PREVENT COUGH OR WHEEZE. RINSE MOUTH AFTER USE. USE WITH SPACER. 1 Inhaler 5  . gabapentin (NEURONTIN) 300 MG capsule TAKE 1 CAPSULE(300 MG) BY MOUTH AT BEDTIME 15 capsule 0  . ibuprofen (ADVIL,MOTRIN) 200 MG tablet Take 200 mg by mouth every 6 (six) hours as needed.    . metFORMIN (GLUCOPHAGE) 1000 MG tablet TAKE 1 tablet TWICE DAILY WITH MEALS 180 tablet 3  . montelukast (SINGULAIR) 10 MG tablet Take 1 tablet (10 mg total) by mouth at bedtime. 30 tablet 5  . Multiple Vitamins-Minerals (CENTRUM ADULTS) TABS Take 1 tablet by mouth daily.    Marland Kitchen venlafaxine XR (EFFEXOR-XR) 75 MG 24 hr capsule Take 1 capsule (75 mg total) daily with breakfast by mouth. 90 capsule 3   No current facility-administered medications on file prior to visit.     Review of Systems:  As per HPI- otherwise negative. No fever or chills No CP No SOB No cough    Physical Examination: Vitals:   04/25/18 1709  BP: 112/80  Pulse: 82  Resp: 16  SpO2: 98%   Vitals:   04/25/18 1709  Weight: 165 lb 12.8 oz (75.2 kg)  Height: 5\' 6"  (1.676 m)   Body mass index is 26.76 kg/m. Ideal Body Weight: Weight in (lb) to have BMI = 25: 154.6  GEN: WDWN, NAD, Non-toxic, A & O x 3, looks well, very tan today HEENT: Atraumatic, Normocephalic. Neck supple. No masses, No LAD  Bilateral TM wnl, oropharynx normal.  PEERL,EOMI.  . Ears and Nose: No external deformity. CV: RRR, No M/G/R. No JVD. No thrill. No extra heart sounds. PULM: CTA B, no wheezes, crackles, rhonchi. No retractions. No resp. distress. No accessory muscle use. ABD: S, NT, ND, +BS. No rebound. No HSM. EXTR: No c/c/e NEURO Normal gait.  PSYCH: Normally interactive. Conversant. Not depressed or anxious appearing.  Calm  demeanor.  Breast: normal exam, no masses/ dimpling/ discharge Cannot reproduce her breast discomfort by palpating area today  EKG- NSR, nothing concerning Compared with EKG from 2018- no change  Assessment and Plan: Anxiety and depression  Controlled type 2 diabetes mellitus with diabetic neuropathy, without long-term current use of insulin (HCC) - Plan: Basic metabolic panel, Hemoglobin A1c, Microalbumin / creatinine urine ratio  Neuropathy  Breast  pain, left - Plan: EKG 12-Lead, DG Chest 2 View, MM DIAG BREAST TOMO BILATERAL  Following up today She has some anxiety surrounding changes coming up at home, but feels that she is handling these ok and does not need any medication change at this time DM- overdue for maint labs,  Will obtain these today Continue gabapentin for neuropathy  Breast pain- does not seem to be cardiovascular in origin.  EKG is reassuring, will also obtain CXR today  Will set up for a diag mammo She will seek care if any change or worsening of her sx  Signed Abbe Amsterdam, MD  Dg Chest 2 View  Result Date: 04/25/2018 CLINICAL DATA:  Pain in the left breast EXAM: CHEST - 2 VIEW COMPARISON:  CT 10/10/2016, radiograph 10/09/2016 FINDINGS: Calcified right upper lung nodule. No consolidation or effusion. Normal heart size. No pneumothorax. IMPRESSION: No active cardiopulmonary disease. Electronically Signed   By: Jasmine Pang M.D.   On: 04/25/2018 18:29    Received labs 8/2- message to pt  Results for orders placed or performed in visit on 04/25/18  Basic metabolic panel  Result Value Ref Range   Sodium 136 135 - 145 mEq/L   Potassium 4.0 3.5 - 5.1 mEq/L   Chloride 98 96 - 112 mEq/L   CO2 29 19 - 32 mEq/L   Glucose, Bld 107 (H) 70 - 99 mg/dL   BUN 18 6 - 23 mg/dL   Creatinine, Ser 1.19 0.40 - 1.20 mg/dL   Calcium 9.9 8.4 - 14.7 mg/dL   GFR 82.95 >62.13 mL/min  Hemoglobin A1c  Result Value Ref Range   Hgb A1c MFr Bld 7.4 (H) 4.6 - 6.5 %  Microalbumin  / creatinine urine ratio  Result Value Ref Range   Microalb, Ur <0.7 0.0 - 1.9 mg/dL   Creatinine,U 08.6 mg/dL   Microalb Creat Ratio 2.9 0.0 - 30.0 mg/g   Your metabolic profile looks fine, no protein in urine A1c is down a bit from last check, good news!  Still a bit higher than goal.   Please work on diet, exercise and let's recheck in 3 months

## 2018-04-25 ENCOUNTER — Ambulatory Visit (HOSPITAL_BASED_OUTPATIENT_CLINIC_OR_DEPARTMENT_OTHER)
Admission: RE | Admit: 2018-04-25 | Discharge: 2018-04-25 | Disposition: A | Discharge status: Home / Self Care | Payer: 59 | Source: Ambulatory Visit | Attending: Family Medicine | Admitting: Family Medicine

## 2018-04-25 ENCOUNTER — Encounter: Payer: Self-pay | Admitting: Family Medicine

## 2018-04-25 ENCOUNTER — Ambulatory Visit (INDEPENDENT_AMBULATORY_CARE_PROVIDER_SITE_OTHER): Payer: 59 | Admitting: Family Medicine

## 2018-04-25 VITALS — BP 112/80 | HR 82 | Resp 16 | Ht 66.0 in | Wt 165.8 lb

## 2018-04-25 DIAGNOSIS — E1140 Type 2 diabetes mellitus with diabetic neuropathy, unspecified: Secondary | ICD-10-CM | POA: Diagnosis not present

## 2018-04-25 DIAGNOSIS — G629 Polyneuropathy, unspecified: Secondary | ICD-10-CM | POA: Diagnosis not present

## 2018-04-25 DIAGNOSIS — N644 Mastodynia: Secondary | ICD-10-CM | POA: Diagnosis not present

## 2018-04-25 DIAGNOSIS — F329 Major depressive disorder, single episode, unspecified: Secondary | ICD-10-CM | POA: Diagnosis not present

## 2018-04-25 DIAGNOSIS — F32A Depression, unspecified: Secondary | ICD-10-CM

## 2018-04-25 DIAGNOSIS — R911 Solitary pulmonary nodule: Secondary | ICD-10-CM | POA: Diagnosis not present

## 2018-04-25 DIAGNOSIS — F419 Anxiety disorder, unspecified: Secondary | ICD-10-CM | POA: Diagnosis not present

## 2018-04-25 NOTE — Patient Instructions (Signed)
It was good to see you today! I will set up a diagnostic mammogram to look at your breast concern in more detail We will get labs for you today to check on your diabetes, and will also get a chest x-ray of your lungs  If your symptoms are getting worse or more persistent please seek care right away!

## 2018-04-26 ENCOUNTER — Encounter: Payer: Self-pay | Admitting: Family Medicine

## 2018-04-26 ENCOUNTER — Ambulatory Visit: Payer: 59 | Admitting: Psychology

## 2018-04-26 LAB — MICROALBUMIN / CREATININE URINE RATIO
Creatinine,U: 24.4 mg/dL
Microalb Creat Ratio: 2.9 mg/g (ref 0.0–30.0)
Microalb, Ur: <0.7 mg/dL (ref 0.0–1.9)

## 2018-04-26 LAB — BASIC METABOLIC PANEL
BUN: 18 mg/dL (ref 6–23)
CALCIUM: 9.9 mg/dL (ref 8.4–10.5)
CO2: 29 meq/L (ref 19–32)
CREATININE: 0.71 mg/dL (ref 0.40–1.20)
Chloride: 98 mEq/L (ref 96–112)
GFR: 90.87 mL/min (ref >60.00)
Glucose, Bld: 107 mg/dL — ABNORMAL HIGH (ref 70–99)
Potassium: 4 mEq/L (ref 3.5–5.1)
Sodium: 136 mEq/L (ref 135–145)

## 2018-04-26 LAB — HEMOGLOBIN A1C: Hgb A1c MFr Bld: 7.4 % — ABNORMAL HIGH (ref 4.6–6.5)

## 2018-04-30 ENCOUNTER — Ambulatory Visit (INDEPENDENT_AMBULATORY_CARE_PROVIDER_SITE_OTHER): Payer: 59 | Admitting: Allergy and Immunology

## 2018-04-30 ENCOUNTER — Encounter: Payer: Self-pay | Admitting: Allergy and Immunology

## 2018-04-30 VITALS — BP 115/70 | HR 86 | Temp 97.6°F | Resp 16 | Ht 64.5 in | Wt 165.4 lb

## 2018-04-30 DIAGNOSIS — J3089 Other allergic rhinitis: Secondary | ICD-10-CM | POA: Diagnosis not present

## 2018-04-30 DIAGNOSIS — J4540 Moderate persistent asthma, uncomplicated: Secondary | ICD-10-CM

## 2018-04-30 MED ORDER — BUDESONIDE-FORMOTEROL FUMARATE 160-4.5 MCG/ACT IN AERO: 2.0000 | INHALATION_SPRAY | Freq: Two times a day (BID) | RESPIRATORY_TRACT | 5 refills | Status: DC

## 2018-04-30 MED ORDER — MONTELUKAST SODIUM 10 MG PO TABS: 10.0000 mg | ORAL_TABLET | Freq: Every day | ORAL | 5 refills | Status: DC

## 2018-04-30 MED ORDER — FLUTICASONE PROPIONATE 50 MCG/ACT NA SUSP: 1.0000 | Freq: Every day | NASAL | 5 refills | Status: DC

## 2018-04-30 NOTE — Patient Instructions (Addendum)
Moderate persistent asthma  A prescription has been provided for Symbicort (budesonide/formoterol) 160/4.5 g,  2 inhalations via spacer device twice a day.  A prescription has been provided for montelukast 10 mg daily at bedtime.  Continue albuterol HFA, 1 to 2 inhalations every 6 hours if needed.  The patient has been asked to contact me if her symptoms persist or progress. Otherwise, she may return for follow up in 4 months.  Allergic rhinitis  Continue appropriate allergen avoidance measures.  Montelukast has been prescribed (as above).  A prescription has been provided for fluticasone nasal spray, one spray per nostril 1-2 times daily as needed.   Nasal saline spray (i.e., Simply Saline) or nasal saline lavage (i.e., NeilMed) is recommended as needed and prior to medicated nasal sprays.   Return in about 4 months (around 08/30/2018), or if symptoms worsen or fail to improve.

## 2018-04-30 NOTE — Progress Notes (Signed)
Follow-up Note  RE: Sierra Lyons MRN: [id number] DOB: 11/07/1960 Date of Office Visit: 04/30/2018  Primary care provider: Pearline Cablesopland, Jessica C, MD Referring provider: Pearline Cablesopland, Jessica C, MD  History of present illness: Sierra Lyons is a 57 y.o. female with persistent asthma and allergic rhinitis presenting today for a sick visit.  She was previously seen in this clinic in December 2016.  She reports that over the past month she has been experiencing frequent asthma symptoms.  She experiences nocturnal awakenings due to lower respiratory symptoms 3-4 nights per week on average and has a "little cough that does not go away."  She reports that she ran out of Symbicort 3 or 4 months ago.  She believes that her asthma symptoms have been triggered by pollen exposure and the fact that she no longer has Symbicort. Sierra Lyons's nasal allergy symptoms have been troublesome over the past couple weeks.  She attempts to control the symptoms with diphenhydramine.  Assessment and plan: Moderate persistent asthma  A prescription has been provided for Symbicort (budesonide/formoterol) 160/4.5 g,  2 inhalations via spacer device twice a day.  A prescription has been provided for montelukast 10 mg daily at bedtime.  Continue albuterol HFA, 1 to 2 inhalations every 6 hours if needed.  The patient has been asked to contact me if her symptoms persist or progress. Otherwise, she may return for follow up in 4 months.  Allergic rhinitis  Continue appropriate allergen avoidance measures.  Montelukast has been prescribed (as above).  A prescription has been provided for fluticasone nasal spray, one spray per nostril 1-2 times daily as needed.   Nasal saline spray (i.e., Simply Saline) or nasal saline lavage (i.e., NeilMed) is recommended as needed and prior to medicated nasal sprays.   Meds ordered this encounter  Medications  . budesonide-formoterol (SYMBICORT) 160-4.5 MCG/ACT inhaler    Sig:  Inhale 2 puffs into the lungs 2 (two) times daily.    Dispense:  1 Inhaler    Refill:  5  . montelukast (SINGULAIR) 10 MG tablet    Sig: Take 1 tablet (10 mg total) by mouth at bedtime.    Dispense:  30 tablet    Refill:  5  . fluticasone (FLONASE) 50 MCG/ACT nasal spray    Sig: Place 1 spray into both nostrils daily.    Dispense:  16 g    Refill:  5    Diagnostics: Spirometry reveals an FVC of 3.58 L and an FEV1 of 2.38 L (87% predicted) with an FEV1 ratio of 84%.  There was significant (320 mL, 13%) postbronchodilator improvement.  Please see scanned spirometry results for details.    Physical examination: Blood pressure 115/70, pulse 86, temperature 97.6 F (36.4 C), resp. rate 16, height 5' 4.5" (1.638 m), weight 165 lb 6.4 oz (75 kg), SpO2 96 %.  General: Alert, interactive, in no acute distress. HEENT: TMs pearly gray, turbinates moderately edematous with thick discharge, post-pharynx mildly erythematous. Neck: Supple without lymphadenopathy. Lungs: Clear to auscultation without wheezing, rhonchi or rales. CV: Normal S1, S2 without murmurs. Skin: Warm and dry, without lesions or rashes.  The following portions of the patient's history were reviewed and updated as appropriate: allergies, current medications, past family history, past medical history, past social history, past surgical history and problem list.  Allergies as of 04/30/2018      Reactions   Aspirin Swelling   Swelling of the face       Medication List  Accurate as of 04/30/18  5:51 PM. Always use your most recent med list.          Albuterol Sulfate 108 (90 Base) MCG/ACT Aepb Commonly known as:  PROAIR RESPICLICK Inhale 1-2 puffs into the lungs every 4 (four) hours as needed (for cough or wheeze).   budesonide-formoterol 160-4.5 MCG/ACT inhaler Commonly known as:  SYMBICORT Inhale 2 puffs into the lungs 2 (two) times daily.   CENTRUM ADULTS Tabs Take 1 tablet by mouth daily.   fluticasone 50  MCG/ACT nasal spray Commonly known as:  FLONASE Place 1 spray into both nostrils daily.   gabapentin 300 MG capsule Commonly known as:  NEURONTIN TAKE 1 CAPSULE(300 MG) BY MOUTH AT BEDTIME   ibuprofen 200 MG tablet Commonly known as:  ADVIL,MOTRIN Take 200 mg by mouth every 6 (six) hours as needed.   metFORMIN 1000 MG tablet Commonly known as:  GLUCOPHAGE TAKE 1 tablet TWICE DAILY WITH MEALS   montelukast 10 MG tablet Commonly known as:  SINGULAIR Take 1 tablet (10 mg total) by mouth at bedtime.   venlafaxine XR 75 MG 24 hr capsule Commonly known as:  EFFEXOR-XR Take 1 capsule (75 mg total) daily with breakfast by mouth.       Allergies  Allergen Reactions  . Aspirin Swelling    Swelling of the face    Review of systems: Review of systems negative except as noted in HPI / PMHx or noted below: Constitutional: Negative.  HENT: Negative.   Eyes: Negative.  Respiratory: Negative.   Cardiovascular: Negative.  Gastrointestinal: Negative.  Genitourinary: Negative.  Musculoskeletal: Negative.  Neurological: Negative.  Endo/Heme/Allergies: Negative.  Cutaneous: Negative.  Past Medical History:  Diagnosis Date  . Asthma   . Chronic rhinitis around 1986  . Diabetes mellitus without complication (HCC)     Family History  Problem Relation Age of Onset  . Diabetes Mother   . Hypertension Mother   . Allergic rhinitis Father   . Allergic rhinitis Sister   . Asthma Sister   . Urticaria Sister   . Diabetes Maternal Grandmother   . Hypertension Maternal Grandmother     Social History   Socioeconomic History  . Marital status: Single    Spouse name: Not on file  . Number of children: Not on file  . Years of education: Not on file  . Highest education level: Not on file  Occupational History  . Not on file  Social Needs  . Financial resource strain: Not on file  . Food insecurity:    Worry: Not on file    Inability: Not on file  . Transportation needs:     Medical: Not on file    Non-medical: Not on file  Tobacco Use  . Smoking status: Never Smoker  . Smokeless tobacco: Never Used  Substance and Sexual Activity  . Alcohol use: No  . Drug use: No  . Sexual activity: Yes    Birth control/protection: None  Lifestyle  . Physical activity:    Days per week: Not on file    Minutes per session: Not on file  . Stress: Not on file  Relationships  . Social connections:    Talks on phone: Not on file    Gets together: Not on file    Attends religious service: Not on file    Active member of club or organization: Not on file    Attends meetings of clubs or organizations: Not on file    Relationship status: Not on  file  . Intimate partner violence:    Fear of current or ex partner: Not on file    Emotionally abused: Not on file    Physically abused: Not on file    Forced sexual activity: Not on file  Other Topics Concern  . Not on file  Social History Narrative  . Not on file    I appreciate the opportunity to take part in [place] care. Please do not hesitate to contact me with questions.  Sincerely,   R. Jorene Guest, MD

## 2018-04-30 NOTE — Assessment & Plan Note (Signed)
   A prescription has been provided for Symbicort (budesonide/formoterol) 160/4.5 g, 2 inhalations via spacer device twice a day.  A prescription has been provided for montelukast 10 mg daily at bedtime.  Continue albuterol HFA, 1 to 2 inhalations every 6 hours if needed.  The patient has been asked to contact me if her symptoms persist or progress. Otherwise, she may return for follow up in 4 months.

## 2018-04-30 NOTE — Assessment & Plan Note (Signed)
   Continue appropriate allergen avoidance measures.  Montelukast has been prescribed (as above).  A prescription has been provided for fluticasone nasal spray, one spray per nostril 1-2 times daily as needed.   Nasal saline spray (i.e., Simply Saline) or nasal saline lavage (i.e., NeilMed) is recommended as needed and prior to medicated nasal sprays.

## 2018-05-10 ENCOUNTER — Ambulatory Visit (INDEPENDENT_AMBULATORY_CARE_PROVIDER_SITE_OTHER): Payer: 59 | Admitting: Psychology

## 2018-05-10 DIAGNOSIS — F4323 Adjustment disorder with mixed anxiety and depressed mood: Secondary | ICD-10-CM | POA: Diagnosis not present

## 2018-05-30 ENCOUNTER — Other Ambulatory Visit: Payer: Self-pay | Admitting: Family Medicine

## 2018-05-30 DIAGNOSIS — N951 Menopausal and female climacteric states: Secondary | ICD-10-CM

## 2018-05-30 DIAGNOSIS — F32A Depression, unspecified: Secondary | ICD-10-CM

## 2018-05-30 DIAGNOSIS — F419 Anxiety disorder, unspecified: Secondary | ICD-10-CM

## 2018-05-30 DIAGNOSIS — F329 Major depressive disorder, single episode, unspecified: Secondary | ICD-10-CM

## 2018-06-12 ENCOUNTER — Ambulatory Visit: Payer: 59 | Admitting: Psychology

## 2018-06-18 ENCOUNTER — Other Ambulatory Visit: Payer: Self-pay | Admitting: Family Medicine

## 2018-06-18 DIAGNOSIS — G629 Polyneuropathy, unspecified: Secondary | ICD-10-CM

## 2018-08-01 ENCOUNTER — Other Ambulatory Visit: Payer: Self-pay | Admitting: Family Medicine

## 2018-08-01 DIAGNOSIS — N644 Mastodynia: Secondary | ICD-10-CM

## 2018-08-05 ENCOUNTER — Other Ambulatory Visit: Payer: Self-pay

## 2018-08-21 ENCOUNTER — Telehealth: Payer: Self-pay

## 2018-08-21 ENCOUNTER — Other Ambulatory Visit: Payer: Self-pay | Admitting: Family Medicine

## 2018-08-21 DIAGNOSIS — F419 Anxiety disorder, unspecified: Secondary | ICD-10-CM

## 2018-08-21 DIAGNOSIS — N951 Menopausal and female climacteric states: Secondary | ICD-10-CM

## 2018-08-21 DIAGNOSIS — F329 Major depressive disorder, single episode, unspecified: Secondary | ICD-10-CM

## 2018-08-21 DIAGNOSIS — F32A Depression, unspecified: Secondary | ICD-10-CM

## 2018-08-21 MED ORDER — VENLAFAXINE HCL ER 75 MG PO CP24: 75.0000 mg | ORAL_CAPSULE | Freq: Every day | ORAL | 3 refills | Status: DC

## 2018-08-21 NOTE — Telephone Encounter (Signed)
Copied from CRM [id number]. Topic: Quick Communication - Rx Refill/Question >> Aug 21, 2018  9:57 AM Gaynelle AduPoole, Sierra wrote: Medication:venlafaxine XR (EFFEXOR-XR) 75 MG 24 hr capsule   Has the patient contacted their pharmacy? Yes   Preferred Pharmacy (with phone number or street name):[facility] [address] - [place], [place] - [address] [phone] (Phone) [phone] (Fax)    Agent: Please be advised that RX refills may take up to 3 business days. We ask that you follow-up with your pharmacy.

## 2018-08-21 NOTE — Telephone Encounter (Signed)
Pt. Calling, requesting venlafaxine 75mg  refill. Medication refilled per protocol.

## 2018-08-24 ENCOUNTER — Other Ambulatory Visit: Payer: Self-pay | Admitting: Family Medicine

## 2018-08-24 DIAGNOSIS — E1140 Type 2 diabetes mellitus with diabetic neuropathy, unspecified: Secondary | ICD-10-CM

## 2018-08-30 DIAGNOSIS — J029 Acute pharyngitis, unspecified: Secondary | ICD-10-CM | POA: Diagnosis not present

## 2018-09-02 ENCOUNTER — Ambulatory Visit: Payer: 59 | Admitting: Allergy and Immunology

## 2018-10-04 ENCOUNTER — Other Ambulatory Visit: Payer: Self-pay | Admitting: *Deleted

## 2018-10-04 MED ORDER — MONTELUKAST SODIUM 10 MG PO TABS: 10.0000 mg | ORAL_TABLET | Freq: Every day | ORAL | 0 refills | Status: DC

## 2018-10-05 NOTE — Progress Notes (Addendum)
[facility] [address], [address] [place], [place] [address] [phone]  Date:  10/07/2018   Name:  Sierra Lyons   DOB:  January 09, 1961   MRN:  [id number]  PCP:  Sierra Cables, MD    Chief Complaint: Diabetes (running over 200's, feels like she is eating better, nausea-other symptoms) and Flu Vaccine   History of Present Illness:  Sierra Lyons is a 58 y.o. very pleasant female patient who presents with the following:  Today for a follow-up visit.  History of diabetes, moderate asthma.  Seen by myself in August-at times she is having more difficulty with anxiety.  She was moving to a new home and her son (who has special needs and is 34 years old) was going to stay live in their previously shared home by himself She is still in the shared home, they have not been able to work out the move as of yet  Recent A1c was slightly above goal at 7.4%, but was improved.  I asked her to work on diet and exercise, and visit in 3 months Her glucose has been a bit higher recently- may be around 200 despite trying to follow her diet  She is using metformin 1000 twice daily She also sees an allergist.  Eye exam: she is due and will go  Due for a foot exam today Flu shot? Will give today   She stopped her menses 2 years ago  Her sister was dx with osteoporosis at a young age. Sierra Lyons wonders if she should be screened.  I offered to set up a DEXA scan for her, but at this time she declines.  She does plan to start on a calcium and vitamin D supplement  Sierra Lyons notices that she feels unmotivated, she is really just going to work and then going home.  She feels like she may be depressed.  She is currently taking Effexor XR 75 mg daily, and is tolerating this well.  She would be willing to try going up on her dose.  She denies any suicidal or homicidal ideation  Lab Results  Component Value Date   HGBA1C 7.4 (H) 04/25/2018   Wt  Readings from Last 3 Encounters:  10/07/18 165 lb (74.8 kg)  04/30/18 165 lb 6.4 oz (75 kg)  04/25/18 165 lb 12.8 oz (75.2 kg)     Patient Active Problem List   Diagnosis Date Noted  . Overweight 01/05/2016  . Diabetes mellitus type 2, uncontrolled (HCC) 11/22/2015  . Moderate persistent asthma 09/07/2015  . Allergic rhinitis 08/10/2015    Past Medical History:  Diagnosis Date  . Asthma   . Chronic rhinitis around 1986  . Diabetes mellitus without complication [facility])     Past Surgical History:  Procedure Laterality Date  . CESAREAN SECTION  1993, 1997, 1999  . VEIN SURGERY  2012    Social History   Tobacco Use  . Smoking status: Never Smoker  . Smokeless tobacco: Never Used  Substance Use Topics  . Alcohol use: No  . Drug use: No    Family History  Problem Relation Age of Onset  . Diabetes Mother   . Hypertension Mother   . Allergic rhinitis Father   . Allergic rhinitis Sister   . Asthma Sister   . Urticaria Sister   . Diabetes Maternal Grandmother   . Hypertension Maternal Grandmother     Allergies  Allergen Reactions  . Aspirin Swelling  Swelling of the face     Medication list has been reviewed and updated.  Current Outpatient Medications on File Prior to Visit  Medication Sig Dispense Refill  . Albuterol Sulfate (PROAIR RESPICLICK) 108 (90 BASE) MCG/ACT AEPB Inhale 1-2 puffs into the lungs every 4 (four) hours as needed (for cough or wheeze). 1 each 1  . budesonide-formoterol (SYMBICORT) 160-4.5 MCG/ACT inhaler Inhale 2 puffs into the lungs 2 (two) times daily. 1 Inhaler 5  . fluticasone (FLONASE) 50 MCG/ACT nasal spray Place 1 spray into both nostrils daily. 16 g 5  . gabapentin (NEURONTIN) 300 MG capsule TAKE 1 CAPSULE BY MOUTH EVERY DAY EVERY NIGHT AT BEDTIME 90 capsule 1  . ibuprofen (ADVIL,MOTRIN) 200 MG tablet Take 200 mg by mouth every 6 (six) hours as needed.    . metFORMIN (GLUCOPHAGE) 1000 MG tablet TAKE 1 TABLET BY MOUTH TWICE DAILY  WITH MEALS 180 tablet 1  . montelukast (SINGULAIR) 10 MG tablet Take 1 tablet (10 mg total) by mouth at bedtime. 90 tablet 0  . Multiple Vitamins-Minerals (CENTRUM ADULTS) TABS Take 1 tablet by mouth daily.    Marland Kitchen. venlafaxine XR (EFFEXOR-XR) 37.5 MG 24 hr capsule TAKE 1 CAPSULE(37.5 MG) BY MOUTH DAILY WITH BREAKFAST 90 capsule 1  . venlafaxine XR (EFFEXOR-XR) 75 MG 24 hr capsule Take 1 capsule (75 mg total) by mouth daily with breakfast. 90 capsule 3   No current facility-administered medications on file prior to visit.     Review of Systems:  As per HPI- otherwise negative. Wt Readings from Last 3 Encounters:  10/07/18 165 lb (74.8 kg)  04/30/18 165 lb 6.4 oz (75 kg)  04/25/18 165 lb 12.8 oz (75.2 kg)   No fever or chills, no chest pain or shortness of breath.  No weight change Physical Examination: Vitals:   10/07/18 1540  BP: 116/80  Pulse: 96  Resp: 16  Temp: 98.5 F (36.9 C)  SpO2: 99%   Vitals:   10/07/18 1540  Weight: 165 lb (74.8 kg)  Height: 5' 4.5" (1.638 m)   Body mass index is 27.88 kg/m. Ideal Body Weight: Weight in (lb) to have BMI = 25: 147.6  GEN: WDWN, NAD, Non-toxic, A & O x 3, looks well, mild overweight HEENT: Atraumatic, Normocephalic. Neck supple. No masses, No LAD. Ears and Nose: No external deformity. CV: RRR, No M/G/R. No JVD. No thrill. No extra heart sounds. PULM: CTA B, no wheezes, crackles, rhonchi. No retractions. No resp. distress. No accessory muscle use. ABD: S, NT, ND, +BS. No rebound. No HSM. EXTR: No c/c/e NEURO Normal gait.  PSYCH: Normally interactive. Conversant. Not depressed or anxious appearing.  Calm demeanor.  Foot exam is normal   Assessment and Plan: Controlled type 2 diabetes mellitus with diabetic neuropathy, without long-term current use of insulin (HCC) - Plan: CBC, Comprehensive metabolic panel, Hemoglobin A1c, Lipid panel  Anxiety and depression - Plan: venlafaxine XR (EFFEXOR-XR) 150 MG 24 hr capsule  Neuropathy -  Plan: CBC, Comprehensive metabolic panel  Perimenopausal symptoms  Need for influenza vaccination - Plan: Flu Vaccine QUAD 6+ mos PF IM (Fluarix Quad PF)  Here today to recheck.  Await A1c, and will discuss with patient.  We may need to add a medication such as far [employer] for A1c is still above goal. Flu shot given today. We will increase her Effexor from 75 to 150 mg.  Shared with patient that my computer warned me that 75 mg is the maximum dose for her, however I  was unable to figure out why this would be.  I think the issue is just that we do not have a very recent creatinine clearance for her, we will check her kidney function today.  However otherwise it seems that she has no restrictions on her Effexor dose.  She states understanding  Will plan further follow- up pending labs.  Signed Abbe AmsterdamJessica Copland, MD  Addendum 1/14.  Received labs as below, message to patient  Results for orders placed or performed in visit on 10/07/18  CBC  Result Value Ref Range   WBC 6.3 4.0 - 10.5 K/uL   RBC 4.12 3.87 - 5.11 Mil/uL   Platelets 280.0 150.0 - 400.0 K/uL   Hemoglobin 12.7 12.0 - 15.0 g/dL   HCT 40.938.1 81.136.0 - 91.446.0 %   MCV 92.4 78.0 - 100.0 fl   MCHC 33.4 30.0 - 36.0 g/dL   RDW 78.214.2 95.611.5 - 21.315.5 %  Comprehensive metabolic panel  Result Value Ref Range   Sodium 138 135 - 145 mEq/L   Potassium 4.1 3.5 - 5.1 mEq/L   Chloride 102 96 - 112 mEq/L   CO2 27 19 - 32 mEq/L   Glucose, Bld 87 70 - 99 mg/dL   BUN 20 6 - 23 mg/dL   Creatinine, Ser 0.860.71 0.40 - 1.20 mg/dL   Total Bilirubin 0.2 0.2 - 1.2 mg/dL   Alkaline Phosphatase 40 39 - 117 U/L   AST 15 0 - 37 U/L   ALT 11 0 - 35 U/L   Total Protein 6.7 6.0 - 8.3 g/dL   Albumin 4.3 3.5 - 5.2 g/dL   Calcium 57.810.0 8.4 - 46.910.5 mg/dL   GFR 62.9590.72 >28.41>60.00 mL/min  Hemoglobin A1c  Result Value Ref Range   Hgb A1c MFr Bld 8.2 (H) 4.6 - 6.5 %  Lipid panel  Result Value Ref Range   Cholesterol 229 (H) 0 - 200 mg/dL   Triglycerides 324.4105.0 0.0 - 149.0 mg/dL    HDL 01.0269.50 >72.53>39.00 mg/dL   VLDL 66.421.0 0.0 - 40.340.0 mg/dL   LDL Cholesterol 474138 (H) 0 - 99 mg/dL   Total CHOL/HDL Ratio 3    NonHDL 159.16

## 2018-10-07 ENCOUNTER — Encounter: Payer: Self-pay | Admitting: Family Medicine

## 2018-10-07 ENCOUNTER — Ambulatory Visit: Payer: 59 | Admitting: Family Medicine

## 2018-10-07 VITALS — BP 116/80 | HR 96 | Temp 98.5°F | Resp 16 | Ht 64.5 in | Wt 165.0 lb

## 2018-10-07 DIAGNOSIS — G629 Polyneuropathy, unspecified: Secondary | ICD-10-CM

## 2018-10-07 DIAGNOSIS — F419 Anxiety disorder, unspecified: Secondary | ICD-10-CM | POA: Diagnosis not present

## 2018-10-07 DIAGNOSIS — E1140 Type 2 diabetes mellitus with diabetic neuropathy, unspecified: Secondary | ICD-10-CM | POA: Diagnosis not present

## 2018-10-07 DIAGNOSIS — N951 Menopausal and female climacteric states: Secondary | ICD-10-CM

## 2018-10-07 DIAGNOSIS — F32A Depression, unspecified: Secondary | ICD-10-CM

## 2018-10-07 DIAGNOSIS — Z23 Encounter for immunization: Secondary | ICD-10-CM | POA: Diagnosis not present

## 2018-10-07 DIAGNOSIS — F329 Major depressive disorder, single episode, unspecified: Secondary | ICD-10-CM

## 2018-10-07 MED ORDER — VENLAFAXINE HCL ER 150 MG PO CP24: 150.0000 mg | ORAL_CAPSULE | Freq: Every day | ORAL | 3 refills | Status: DC

## 2018-10-07 NOTE — Patient Instructions (Addendum)
It was good to see you today, but I am sorry you have been feeling down. Let us try increasing your Effexor to 150 mg a day.  However, if you do not notice improvement after about a month please let me know; in that case we might want to try different medication  You got your flu shot today  We will check your labs, and I will be in touch with your results asap

## 2018-10-08 ENCOUNTER — Encounter: Payer: Self-pay | Admitting: Family Medicine

## 2018-10-08 DIAGNOSIS — E1140 Type 2 diabetes mellitus with diabetic neuropathy, unspecified: Secondary | ICD-10-CM

## 2018-10-08 LAB — CBC
HEMATOCRIT: 38.1 % (ref 36.0–46.0)
HEMOGLOBIN: 12.7 g/dL (ref 12.0–15.0)
MCHC: 33.4 g/dL (ref 30.0–36.0)
MCV: 92.4 fl (ref 78.0–100.0)
PLATELETS: 280 10*3/uL (ref 150.0–400.0)
RBC: 4.12 Mil/uL (ref 3.87–5.11)
RDW: 14.2 % (ref 11.5–15.5)
WBC: 6.3 10*3/uL (ref 4.0–10.5)

## 2018-10-08 LAB — LIPID PANEL
CHOL/HDL RATIO: 3
Cholesterol: 229 mg/dL — ABNORMAL HIGH (ref 0–200)
HDL: 69.5 mg/dL (ref >39.00)
LDL CALC: 138 mg/dL — AB (ref 0–99)
NonHDL: 159.16
TRIGLYCERIDES: 105 mg/dL (ref 0.0–149.0)
VLDL: 21 mg/dL (ref 0.0–40.0)

## 2018-10-08 LAB — COMPREHENSIVE METABOLIC PANEL
ALT: 11 U/L (ref 0–35)
AST: 15 U/L (ref 0–37)
Albumin: 4.3 g/dL (ref 3.5–5.2)
Alkaline Phosphatase: 40 U/L (ref 39–117)
BILIRUBIN TOTAL: 0.2 mg/dL (ref 0.2–1.2)
BUN: 20 mg/dL (ref 6–23)
CO2: 27 mEq/L (ref 19–32)
Calcium: 10 mg/dL (ref 8.4–10.5)
Chloride: 102 mEq/L (ref 96–112)
Creatinine, Ser: 0.71 mg/dL (ref 0.40–1.20)
GFR: 90.72 mL/min (ref >60.00)
Glucose, Bld: 87 mg/dL (ref 70–99)
Potassium: 4.1 mEq/L (ref 3.5–5.1)
Sodium: 138 mEq/L (ref 135–145)
Total Protein: 6.7 g/dL (ref 6.0–8.3)

## 2018-10-08 LAB — HEMOGLOBIN A1C: Hgb A1c MFr Bld: 8.2 % — ABNORMAL HIGH (ref 4.6–6.5)

## 2018-10-09 ENCOUNTER — Telehealth: Payer: Self-pay | Admitting: *Deleted

## 2018-10-09 MED ORDER — LOVASTATIN 20 MG PO TABS: 20.0000 mg | ORAL_TABLET | Freq: Every day | ORAL | 3 refills | Status: DC

## 2018-10-09 MED ORDER — CANAGLIFLOZIN 100 MG PO TABS: 100.0000 mg | ORAL_TABLET | Freq: Every day | ORAL | 6 refills | Status: DC

## 2018-10-09 NOTE — Telephone Encounter (Signed)
Received Annual Routine Physical Exam Certification Form, completed as much as possible; forwarded to provider/SLS 01/15

## 2018-10-16 MED ORDER — CANAGLIFLOZIN 100 MG PO TABS: 100.0000 mg | ORAL_TABLET | Freq: Every day | ORAL | 6 refills | Status: DC

## 2018-10-16 NOTE — Addendum Note (Signed)
Addended by: [name] on: 10/16/2018 07:43 PM   Modules accepted: Orders

## 2018-11-22 ENCOUNTER — Other Ambulatory Visit: Payer: Self-pay | Admitting: Family Medicine

## 2018-11-22 ENCOUNTER — Other Ambulatory Visit: Payer: Self-pay

## 2018-11-22 ENCOUNTER — Other Ambulatory Visit: Payer: Self-pay | Admitting: Allergy and Immunology

## 2018-11-22 DIAGNOSIS — G629 Polyneuropathy, unspecified: Secondary | ICD-10-CM

## 2018-11-25 ENCOUNTER — Other Ambulatory Visit: Payer: Self-pay | Admitting: Allergy and Immunology

## 2018-12-12 ENCOUNTER — Other Ambulatory Visit: Payer: Self-pay | Admitting: Allergy and Immunology

## 2018-12-28 ENCOUNTER — Other Ambulatory Visit: Payer: Self-pay | Admitting: Allergy and Immunology

## 2019-02-18 ENCOUNTER — Other Ambulatory Visit: Payer: Self-pay | Admitting: Allergy and Immunology

## 2019-02-26 ENCOUNTER — Other Ambulatory Visit: Payer: Self-pay | Admitting: Allergy and Immunology

## 2019-02-26 ENCOUNTER — Other Ambulatory Visit: Payer: Self-pay | Admitting: Family Medicine

## 2019-02-26 DIAGNOSIS — E1140 Type 2 diabetes mellitus with diabetic neuropathy, unspecified: Secondary | ICD-10-CM

## 2019-03-06 ENCOUNTER — Other Ambulatory Visit: Payer: Self-pay | Admitting: Allergy and Immunology

## 2019-05-11 ENCOUNTER — Other Ambulatory Visit: Payer: Self-pay | Admitting: Family Medicine

## 2019-05-11 ENCOUNTER — Other Ambulatory Visit: Payer: Self-pay | Admitting: Allergy and Immunology

## 2019-05-11 DIAGNOSIS — E1140 Type 2 diabetes mellitus with diabetic neuropathy, unspecified: Secondary | ICD-10-CM

## 2019-07-01 ENCOUNTER — Encounter: Payer: Self-pay | Admitting: Family Medicine

## 2019-07-10 ENCOUNTER — Telehealth: Payer: 59 | Admitting: Physician Assistant

## 2019-07-10 DIAGNOSIS — N898 Other specified noninflammatory disorders of vagina: Secondary | ICD-10-CM

## 2019-07-10 NOTE — Progress Notes (Signed)
Based on what you shared with me, I feel your condition warrants further evaluation and I recommend that you be seen for a video visit with your primary care provider -- Giving no discharge but pain with urination we need to determine if there is just a vaginal yeast infection present, vaginal bacterial overgrowth and/or a bladder infection. This can be determined by assessment of urine which your PCP can bring you in for after assessing things via video visit. She may also decide to empirically treat but via e-visit we have to follow strict rules since we are not directly interacting with you.  aNOTE: If you entered your credit card information for this eVisit, you will not be charged. You may see a "hold" on your card for the $35 but that hold will drop off and you will not have a charge processed.  If you are having a true medical emergency please call 911.     For an urgent face to face visit, [place] has four urgent care centers for your convenience:   . [facility]    [phone]                  Get Driving Directions  [address], [place] [address] . 10 am to 8 pm Monday-Friday . 12 pm to 8 pm Saturday-Sunday   . [facility] at [place]                  Get Driving Directions  [address], [place], [place] [address] . 8 am to 8 pm Monday-Friday . 9 am to 6 pm Saturday . 11 am to 6 pm Sunday   . [facility] at [place]                  Get Driving Directions   [address], [place] [address] . 8 am to 8 pm Monday-Friday . 8 am to 4 pm Saturday-Sunday    . [facility] at [place]                    Get Driving Directions  [phone]  [address], [place],  [address]  . Monday-Friday, 12 PM to 6 PM    Your e-visit answers were reviewed by a board certified advanced clinical practitioner to  complete your personal care plan.  Thank you for using e-Visits.

## 2019-09-29 ENCOUNTER — Telehealth: Payer: Self-pay | Admitting: Family Medicine

## 2019-09-29 DIAGNOSIS — E1140 Type 2 diabetes mellitus with diabetic neuropathy, unspecified: Secondary | ICD-10-CM

## 2019-09-29 MED ORDER — METFORMIN HCL 1000 MG PO TABS: 1000.0000 mg | ORAL_TABLET | Freq: Two times a day (BID) | ORAL | 1 refills | Status: DC

## 2019-09-29 NOTE — Telephone Encounter (Signed)
Medication Refill - Medicationnt cmetFORMIN (GLUCOPHAGE) 1000 MG tablet [[id number] ontacted their pharmacy? No. (Agent: If no, request that the patient contact the pharmacy for the refill.) (Agent: If yes, when and what did the pharmacy advise?)  Preferred Pharmacy (with phone number or street name): [facility] [address]   Agent: Please be advised that RX refills may take up to 3 business days. We ask that you follow-up with your pharmacy.

## 2019-09-29 NOTE — Telephone Encounter (Signed)
Refill sent.

## 2019-10-10 ENCOUNTER — Other Ambulatory Visit: Payer: Self-pay

## 2019-10-12 NOTE — Progress Notes (Addendum)
[facility] [address], [address] [place], [place] [address] [phone]  Date:  10/13/2019   Name:  Sierra Lyons   DOB:  02-Jul-1961   MRN:  [id number]  PCP:  [name], MD    Chief Complaint: Arm Pain (Decrease ROM)   History of Present Illness:  Sierra Lyons is a 59 y.o. very pleasant female patient who presents with the following:  Here today for periodic diabetes follow-up in a few other concerns  History of type 2 diabetes, weight, asthma, allergies She notes that her asthma and allergies are under ok control- she is not having to use her albuterol often  Last seen by myself in January of this year At that time she was having some more anxiety, she was in the process of moving and trying to settle her son (who is 16 years old and has special needs) into his own home He is doing overall well and does have some help at home  At that time I increased her Effexor from 75 to 150 mg; she feels like this is working well for her , wishes to continue this dose Her A1c was a bit high, and her lipids not ideal-I added lovastatin and Invokana to her regimen However pt states she never got the lovastatin prescription for some reason so she never started taking it  Lab Results  Component Value Date   HGBA1C 8.2 (H) 10/07/2018   Eye exam- done this past February  Labs- do today Pap- will do today  Flu vaccine- done 07/2019 Mammogram- order for her today  Cologuard now due- will order Foot exam due today   She has noted left shoulder pain for 7 months or so NKI She has some limitation with overhead lifting, both pain and reduced range of motion  She has noted nausea each morning for several months No GERD She is menopausal; LMP 2 years ago   She also notes that she may get an occasional vaginal yeast infection, and some symptoms of Candida intertrigo Patient Active Problem List   Diagnosis Date Noted  .  Overweight 01/05/2016  . Diabetes mellitus type 2, uncontrolled (HCC) 11/22/2015  . Moderate persistent asthma 09/07/2015  . Allergic rhinitis 08/10/2015    Past Medical History:  Diagnosis Date  . Asthma   . Chronic rhinitis around 1986  . Diabetes mellitus without complication [facility])     Past Surgical History:  Procedure Laterality Date  . CESAREAN SECTION  1993, 1997, 1999  . VEIN SURGERY  2012    Social History   Tobacco Use  . Smoking status: Never Smoker  . Smokeless tobacco: Never Used  Substance Use Topics  . Alcohol use: No  . Drug use: No    Family History  Problem Relation Age of Onset  . Diabetes Mother   . Hypertension Mother   . Allergic rhinitis Father   . Allergic rhinitis Sister   . Asthma Sister   . Urticaria Sister   . Diabetes Maternal Grandmother   . Hypertension Maternal Grandmother     Allergies  Allergen Reactions  . Aspirin Swelling    Swelling of the face     Medication list has been reviewed and updated.  Current Outpatient Medications on File Prior to Visit  Medication Sig Dispense Refill  . Albuterol Sulfate (PROAIR RESPICLICK) 108 (90 BASE) MCG/ACT AEPB Inhale 1-2 puffs into the lungs every 4 (four) hours as needed (for  cough or wheeze). 1 each 1  . fluticasone (FLONASE) 50 MCG/ACT nasal spray Place 1 spray into both nostrils daily. 16 g 5  . ibuprofen (ADVIL,MOTRIN) 200 MG tablet Take 200 mg by mouth every 6 (six) hours as needed.    . INVOKANA 100 MG TABS tablet TAKE 1 TABLET(100 MG) BY MOUTH DAILY BEFORE BREAKFAST 30 tablet 5  . lovastatin (MEVACOR) 20 MG tablet Take 1 tablet (20 mg total) by mouth at bedtime. 90 tablet 3  . metFORMIN (GLUCOPHAGE) 1000 MG tablet Take 1 tablet (1,000 mg total) by mouth 2 (two) times daily with a meal. 180 tablet 1  . montelukast (SINGULAIR) 10 MG tablet TAKE 1 TABLET BY MOUTH AT  BEDTIME 90 tablet 0  . Multiple Vitamins-Minerals (CENTRUM ADULTS) TABS Take 1 tablet by mouth daily.    . SYMBICORT  160-4.5 MCG/ACT inhaler INHALE 2 PUFFS INTO THE LUNGS TWICE DAILY 10.2 g 0   No current facility-administered medications on file prior to visit.    Review of Systems:  As per HPI- otherwise negative. No fever chills  Physical Examination: Vitals:   10/13/19 0956  BP: 102/64  Pulse: 72  Resp: 16  Temp: (!) 95.9 F (35.5 C)  SpO2: 98%   Vitals:   10/13/19 0956  Weight: 151 lb (68.5 kg)  Height: 5' 4.5" (1.638 m)   Body mass index is 25.52 kg/m. Ideal Body Weight: Weight in (lb) to have BMI = 25: 147.6  GEN: WDWN, NAD, Non-toxic, A & O x 3, normal weight, looks well HEENT: Atraumatic, Normocephalic. Neck supple. No masses, No LAD. Ears and Nose: No external deformity. CV: RRR, No M/G/R. No JVD. No thrill. No extra heart sounds. PULM: CTA B, no wheezes, crackles, rhonchi. No retractions. No resp. distress. No accessory muscle use. ABD: S, NT, ND, +BS. No rebound. No HSM. EXTR: No c/c/e NEURO Normal gait.  PSYCH: Normally interactive. Conversant. Not depressed or anxious appearing.  Calm demeanor.  Her left shoulder is missing 10 to 15 degrees of flexion, abduction, internal and external rotation.  No weakness is appreciated Pelvic: normal, no vaginal lesions or discharge. Uterus normal, no CMT, no adnexal tendereness or masses    Assessment and Plan: Controlled type 2 diabetes mellitus with diabetic neuropathy, without long-term current use of insulin ([place]) - Plan: Comprehensive metabolic panel, Hemoglobin A1c, Microalbumin / creatinine urine ratio, lovastatin (MEVACOR) 20 MG tablet, canagliflozin (INVOKANA) 100 MG TABS tablet  Anxiety and depression - Plan: venlafaxine XR (EFFEXOR-XR) 150 MG 24 hr capsule  Neuropathy  Screening for colon cancer  Screening for thyroid disorder - Plan: TSH  Screening for HIV (human immunodeficiency virus) - Plan: HIV antibody  Screening for deficiency anemia - Plan: CBC  Screening for cervical cancer - Plan: Cytology -  PAP  Candidal intertrigo  Encounter for screening mammogram for malignant neoplasm of breast - Plan: MM 3D SCREEN BREAST BILATERAL  Chronic left shoulder pain - Plan: Ambulatory referral to Orthopedic Surgery  Here today for follow-up visit Referral to orthopedics for her left shoulder, likely needs a steroid injection and physical therapy Order mammogram Collected Pap Order Cologuard Labs pending as above Start lovastatin Doing well on current dose of Effexor, continue Will plan further follow- up pending labs. Moderate medical decision making today This visit occurred during the SARS-CoV-2 public health emergency.  Safety protocols were in place, including screening questions prior to the visit, additional usage of staff PPE, and extensive cleaning of exam room while observing appropriate contact time  as indicated for disinfecting solutions.    Signed Abbe Amsterdam, MD  Received her labs as below, message to patient A1c has increased from 8.2 to 9%  Results for orders placed or performed in visit on 10/13/19  CBC  Result Value Ref Range   WBC 5.1 4.0 - 10.5 K/uL   RBC 4.99 3.87 - 5.11 Mil/uL   Platelets 285.0 150.0 - 400.0 K/uL   Hemoglobin 15.2 (H) 12.0 - 15.0 g/dL   HCT 42.5 (H) 95.6 - 38.7 %   MCV 92.5 78.0 - 100.0 fl   MCHC 32.9 30.0 - 36.0 g/dL   RDW 56.4 33.2 - 95.1 %  Comprehensive metabolic panel  Result Value Ref Range   Sodium 138 135 - 145 mEq/L   Potassium 4.4 3.5 - 5.1 mEq/L   Chloride 99 96 - 112 mEq/L   CO2 29 19 - 32 mEq/L   Glucose, Bld 279 (H) 70 - 99 mg/dL   BUN 19 6 - 23 mg/dL   Creatinine, Ser 8.84 0.40 - 1.20 mg/dL   Total Bilirubin 0.3 0.2 - 1.2 mg/dL   Alkaline Phosphatase 45 39 - 117 U/L   AST 12 0 - 37 U/L   ALT 11 0 - 35 U/L   Total Protein 6.9 6.0 - 8.3 g/dL   Albumin 4.3 3.5 - 5.2 g/dL   GFR 16.60 >63.01 mL/min   Calcium 10.0 8.4 - 10.5 mg/dL  Hemoglobin S0F  Result Value Ref Range   Hgb A1c MFr Bld 9.0 (H) 4.6 - 6.5 %  TSH   Result Value Ref Range   TSH 1.26 0.35 - 4.50 uIU/mL  Microalbumin / creatinine urine ratio  Result Value Ref Range   Microalb, Ur <0.7 0.0 - 1.9 mg/dL   Creatinine,U 09.3 mg/dL   Microalb Creat Ratio 2.0 0.0 - 30.0 mg/g

## 2019-10-12 NOTE — Patient Instructions (Addendum)
It was good to see you today, I will be in touch your labs and pap asap Please stop by the ground floor to ask about your mammogram today- if not available today you can schedule for later   Please complete your Cologuard kit ASAP  I will refer you to orthopedics to discuss your left shoulder issue

## 2019-10-13 ENCOUNTER — Encounter: Payer: Self-pay | Admitting: Family Medicine

## 2019-10-13 ENCOUNTER — Other Ambulatory Visit: Payer: Self-pay

## 2019-10-13 ENCOUNTER — Ambulatory Visit: Payer: 59 | Admitting: Family Medicine

## 2019-10-13 ENCOUNTER — Other Ambulatory Visit (HOSPITAL_COMMUNITY)
Admission: RE | Admit: 2019-10-13 | Discharge: 2019-10-13 | Disposition: A | Discharge status: Home / Self Care | Payer: 59 | Source: Ambulatory Visit | Attending: Family Medicine | Admitting: Family Medicine

## 2019-10-13 VITALS — BP 102/64 | HR 72 | Temp 95.9°F | Resp 16 | Ht 64.5 in | Wt 151.0 lb

## 2019-10-13 DIAGNOSIS — Z124 Encounter for screening for malignant neoplasm of cervix: Secondary | ICD-10-CM | POA: Diagnosis present

## 2019-10-13 DIAGNOSIS — Z130 Encounter for screening for diseases of the blood and blood-forming organs and certain disorders involving the immune mechanism: Secondary | ICD-10-CM

## 2019-10-13 DIAGNOSIS — G629 Polyneuropathy, unspecified: Secondary | ICD-10-CM | POA: Diagnosis not present

## 2019-10-13 DIAGNOSIS — G8929 Other chronic pain: Secondary | ICD-10-CM

## 2019-10-13 DIAGNOSIS — F419 Anxiety disorder, unspecified: Secondary | ICD-10-CM | POA: Diagnosis not present

## 2019-10-13 DIAGNOSIS — Z1329 Encounter for screening for other suspected endocrine disorder: Secondary | ICD-10-CM | POA: Diagnosis not present

## 2019-10-13 DIAGNOSIS — Z114 Encounter for screening for human immunodeficiency virus [HIV]: Secondary | ICD-10-CM

## 2019-10-13 DIAGNOSIS — B372 Candidiasis of skin and nail: Secondary | ICD-10-CM

## 2019-10-13 DIAGNOSIS — Z1211 Encounter for screening for malignant neoplasm of colon: Secondary | ICD-10-CM | POA: Diagnosis not present

## 2019-10-13 DIAGNOSIS — E1140 Type 2 diabetes mellitus with diabetic neuropathy, unspecified: Secondary | ICD-10-CM

## 2019-10-13 DIAGNOSIS — F329 Major depressive disorder, single episode, unspecified: Secondary | ICD-10-CM

## 2019-10-13 DIAGNOSIS — M25512 Pain in left shoulder: Secondary | ICD-10-CM

## 2019-10-13 DIAGNOSIS — Z1231 Encounter for screening mammogram for malignant neoplasm of breast: Secondary | ICD-10-CM

## 2019-10-13 DIAGNOSIS — F32A Depression, unspecified: Secondary | ICD-10-CM

## 2019-10-13 LAB — CBC
HCT: 46.1 % — ABNORMAL HIGH (ref 36.0–46.0)
Hemoglobin: 15.2 g/dL — ABNORMAL HIGH (ref 12.0–15.0)
MCHC: 32.9 g/dL (ref 30.0–36.0)
MCV: 92.5 fl (ref 78.0–100.0)
Platelets: 285 10*3/uL (ref 150.0–400.0)
RBC: 4.99 Mil/uL (ref 3.87–5.11)
RDW: 14 % (ref 11.5–15.5)
WBC: 5.1 10*3/uL (ref 4.0–10.5)

## 2019-10-13 LAB — COMPREHENSIVE METABOLIC PANEL
ALT: 11 U/L (ref 0–35)
AST: 12 U/L (ref 0–37)
Albumin: 4.3 g/dL (ref 3.5–5.2)
Alkaline Phosphatase: 45 U/L (ref 39–117)
BUN: 19 mg/dL (ref 6–23)
CO2: 29 mEq/L (ref 19–32)
Calcium: 10 mg/dL (ref 8.4–10.5)
Chloride: 99 mEq/L (ref 96–112)
Creatinine, Ser: 0.83 mg/dL (ref 0.40–1.20)
GFR: 71.02 mL/min (ref >60.00)
Glucose, Bld: 279 mg/dL — ABNORMAL HIGH (ref 70–99)
Potassium: 4.4 mEq/L (ref 3.5–5.1)
Sodium: 138 mEq/L (ref 135–145)
Total Bilirubin: 0.3 mg/dL (ref 0.2–1.2)
Total Protein: 6.9 g/dL (ref 6.0–8.3)

## 2019-10-13 LAB — HEMOGLOBIN A1C: Hgb A1c MFr Bld: 9 % — ABNORMAL HIGH (ref 4.6–6.5)

## 2019-10-13 LAB — MICROALBUMIN / CREATININE URINE RATIO
Creatinine,U: 34.6 mg/dL
Microalb Creat Ratio: 2 mg/g (ref 0.0–30.0)
Microalb, Ur: <0.7 mg/dL (ref 0.0–1.9)

## 2019-10-13 LAB — TSH: TSH: 1.26 u[IU]/mL (ref 0.35–4.50)

## 2019-10-13 MED ORDER — VENLAFAXINE HCL ER 150 MG PO CP24: 150.0000 mg | ORAL_CAPSULE | Freq: Every day | ORAL | 3 refills | Status: DC

## 2019-10-13 MED ORDER — LOVASTATIN 20 MG PO TABS: 20.0000 mg | ORAL_TABLET | Freq: Every day | ORAL | 3 refills | Status: DC

## 2019-10-13 MED ORDER — FLUCONAZOLE 150 MG PO TABS: 150.0000 mg | ORAL_TABLET | Freq: Once | ORAL | 0 refills | Status: DC

## 2019-10-13 MED ORDER — CANAGLIFLOZIN 100 MG PO TABS: ORAL_TABLET | ORAL | 3 refills | Status: DC

## 2019-10-14 LAB — HIV ANTIBODY (ROUTINE TESTING W REFLEX): HIV 1&2 Ab, 4th Generation: NONREACTIVE

## 2019-10-14 MED ORDER — CANAGLIFLOZIN 300 MG PO TABS: ORAL_TABLET | ORAL | 3 refills | Status: DC

## 2019-10-14 MED ORDER — FLUCONAZOLE 150 MG PO TABS: 150.0000 mg | ORAL_TABLET | Freq: Once | ORAL | 0 refills | Status: AC

## 2019-10-15 ENCOUNTER — Encounter: Payer: Self-pay | Admitting: Family Medicine

## 2019-10-15 LAB — CYTOLOGY - PAP
Comment: NEGATIVE
Diagnosis: NEGATIVE
High risk HPV: NEGATIVE

## 2019-11-12 ENCOUNTER — Encounter: Payer: Self-pay | Admitting: Family Medicine

## 2019-11-18 LAB — COLOGUARD: Cologuard: NEGATIVE

## 2019-11-19 ENCOUNTER — Encounter: Payer: Self-pay | Admitting: Family Medicine

## 2019-12-18 ENCOUNTER — Encounter: Payer: Self-pay | Admitting: Family Medicine

## 2019-12-18 DIAGNOSIS — E1140 Type 2 diabetes mellitus with diabetic neuropathy, unspecified: Secondary | ICD-10-CM

## 2019-12-19 ENCOUNTER — Other Ambulatory Visit (INDEPENDENT_AMBULATORY_CARE_PROVIDER_SITE_OTHER): Payer: 59

## 2019-12-19 ENCOUNTER — Other Ambulatory Visit: Payer: Self-pay

## 2019-12-19 DIAGNOSIS — E1140 Type 2 diabetes mellitus with diabetic neuropathy, unspecified: Secondary | ICD-10-CM

## 2019-12-19 NOTE — Telephone Encounter (Signed)
Measuring more than one antibody will increase the likelihood of a positive value, but it is also more costly. Two (ie, islet cell and GAD65) or three (insulin, GAD65, and IA-2 or GAD65, IA-2, and ZnT8) antibodies can be measured.

## 2019-12-19 NOTE — Addendum Note (Signed)
Addended by: Abbe Amsterdam C on: 12/19/2019 06:11 AM   Modules accepted: Orders

## 2019-12-25 LAB — GAD65, IA-2, AND INSULIN AUTOANTIBODY SERUM
Glutamic Acid Decarb Ab: >250 IU/mL — ABNORMAL HIGH (ref <5)
IA-2 Antibody: <5.4 U/mL (ref <5.4)
Insulin Antibodies, Human: <0.4 U/mL (ref <0.4)

## 2019-12-26 ENCOUNTER — Encounter: Payer: Self-pay | Admitting: Family Medicine

## 2019-12-26 NOTE — Addendum Note (Signed)
Addended by: Abbe Amsterdam C on: 12/26/2019 08:20 AM   Modules accepted: Orders

## 2020-02-04 ENCOUNTER — Other Ambulatory Visit: Payer: Self-pay

## 2020-02-06 ENCOUNTER — Ambulatory Visit: Payer: 59 | Admitting: Internal Medicine

## 2020-02-06 ENCOUNTER — Other Ambulatory Visit: Payer: Self-pay

## 2020-02-06 ENCOUNTER — Encounter: Payer: Self-pay | Admitting: Internal Medicine

## 2020-02-06 VITALS — BP 110/64 | HR 87 | Temp 98.1°F | Ht 65.0 in | Wt 151.6 lb

## 2020-02-06 DIAGNOSIS — E139 Other specified diabetes mellitus without complications: Secondary | ICD-10-CM

## 2020-02-06 DIAGNOSIS — E785 Hyperlipidemia, unspecified: Secondary | ICD-10-CM

## 2020-02-06 HISTORY — DX: Hyperlipidemia, unspecified: E78.5

## 2020-02-06 HISTORY — DX: Other specified diabetes mellitus without complications: E13.9

## 2020-02-06 LAB — POCT GLYCOSYLATED HEMOGLOBIN (HGB A1C): Hemoglobin A1C: 7.7 % — AB (ref 4.0–5.6)

## 2020-02-06 LAB — GLUCOSE, POCT (MANUAL RESULT ENTRY): POC Glucose: 191 mg/dl — AB (ref 70–99)

## 2020-02-06 MED ORDER — INSULIN PEN NEEDLE 32G X 4 MM MISC: 1.0000 | Freq: Two times a day (BID) | 6 refills | Status: DC

## 2020-02-06 MED ORDER — INSULIN LISPRO PROT & LISPRO (75-25 MIX) 100 UNIT/ML KWIKPEN: 10.0000 [IU] | PEN_INJECTOR | Freq: Two times a day (BID) | SUBCUTANEOUS | 11 refills | Status: DC

## 2020-02-06 MED ORDER — DEXCOM G6 SENSOR MISC: 1.0000 | 1 refills | Status: DC

## 2020-02-06 MED ORDER — DEXCOM G6 RECEIVER DEVI: 1.0000 | 0 refills | Status: DC

## 2020-02-06 MED ORDER — DEXCOM G6 TRANSMITTER MISC: 1.0000 | 1 refills | Status: DC

## 2020-02-06 NOTE — Progress Notes (Signed)
Name: Sierra Lyons  MRN/ DOB: [id number], January 14, 1961   Age/ Sex: 59 y.o., female    PCP: Copland, [name], MD   Reason for Endocrinology Evaluation: Latent Autoimmune Diabetes Mellitus     Date of Initial Endocrinology Visit: 02/06/2020     PATIENT IDENTIFIER: Sierra Lyons is a 59 y.o. female with a past medical history of Asthma. The patient presented for initial endocrinology clinic visit on 02/06/2020 for consultative assistance with her diabetes management.    HPI: Sierra Lyons was    Diagnosed with T2DM in 2017 at age 29 but by 11/2019 her diagnosis was changed to LADA based on elevated GAD-65 levels.  Prior Medications tried/Intolerance: Metformin, invokana  Currently checking blood sugars 1 x / week   Hypoglycemia episodes : no              Hemoglobin A1c has ranged from 6.2% in 2017, peaking at 13.0% in 2017. Patient required assistance for hypoglycemia:no   Patient has required hospitalization within the last 1 year from hyper or hypoglycemia: no  In terms of diet, the patient eats 3 meals a day, snacks occasionally due to nausea. Avoids sugar sweetened beverages   She has nausea every morning for the past year.     HOME DIABETES REGIMEN: Invokana 300 mg daily  Metformin 1000 mg BID    Statin: yes ACE-I/ARB: no Prior Diabetic Education: No   METER DOWNLOAD SUMMARY: Did not bring meter   DIABETIC COMPLICATIONS: Microvascular complications:    Denies: CKD, retinopathy   Last eye exam: Completed 10/2018  Macrovascular complications:    Denies: CAD, PVD, CVA   PAST HISTORY: Past Medical History:  Past Medical History:  Diagnosis Date  . Asthma   . Chronic rhinitis around 1986  . Diabetes mellitus without complication [facility])    Past Surgical History:  Past Surgical History:  Procedure Laterality Date  . CESAREAN SECTION  1993, 1997, 1999  . VEIN SURGERY  2012      Social History:  reports that she has never smoked. She has never  used smokeless tobacco. She reports that she does not drink alcohol or use drugs. Family History:  Family History  Problem Relation Age of Onset  . Diabetes Mother   . Hypertension Mother   . Allergic rhinitis Father   . Allergic rhinitis Sister   . Asthma Sister   . Urticaria Sister   . Diabetes Maternal Grandmother   . Hypertension Maternal Grandmother      HOME MEDICATIONS: Allergies as of 02/06/2020      Reactions   Aspirin Swelling   Swelling of the face       Medication List       Accurate as of Feb 06, 2020  3:27 PM. If you have any questions, ask your nurse or doctor.        Albuterol Sulfate 108 (90 Base) MCG/ACT Aepb Commonly known as: ProAir RespiClick Inhale 1-2 puffs into the lungs every 4 (four) hours as needed (for cough or wheeze).   canagliflozin 300 MG Tabs tablet Commonly known as: Invokana TAKE 1 TABLET(300 MG) BY MOUTH DAILY BEFORE BREAKFAST   Centrum Adults Tabs Take 1 tablet by mouth daily.   fluticasone 50 MCG/ACT nasal spray Commonly known as: FLONASE Place 1 spray into both nostrils daily.   ibuprofen 200 MG tablet Commonly known as: ADVIL Take 200 mg by mouth every 6 (six) hours as needed.   lovastatin 20 MG tablet Commonly known as: MEVACOR  Take 1 tablet (20 mg total) by mouth at bedtime.   metFORMIN 1000 MG tablet Commonly known as: GLUCOPHAGE Take 1 tablet (1,000 mg total) by mouth 2 (two) times daily with a meal.   montelukast 10 MG tablet Commonly known as: SINGULAIR TAKE 1 TABLET BY MOUTH AT  BEDTIME   Symbicort 160-4.5 MCG/ACT inhaler Generic drug: budesonide-formoterol INHALE 2 PUFFS INTO THE LUNGS TWICE DAILY   venlafaxine XR 150 MG 24 hr capsule Commonly known as: EFFEXOR-XR Take 1 capsule (150 mg total) by mouth daily with breakfast.        ALLERGIES: Allergies  Allergen Reactions  . Aspirin Swelling    Swelling of the face      REVIEW OF SYSTEMS: A comprehensive ROS was conducted with the patient  and is negative except as per HPI and below:  Review of Systems  Gastrointestinal: Positive for nausea and vomiting.  Genitourinary: Negative for frequency.  Neurological: Negative for tingling and tremors.  Endo/Heme/Allergies: Negative for polydipsia.      OBJECTIVE:   VITAL SIGNS: BP 110/64 (BP Location: Right Arm, Patient Position: Sitting, Cuff Size: Large)   Pulse 87   Temp 98.1 F (36.7 C)   Ht 5\' 5"  (1.651 m)   Wt 151 lb 9.6 oz (68.8 kg)   SpO2 98%   BMI 25.23 kg/m    PHYSICAL EXAM:  General: Pt appears well and is in NAD  HEENT:  Eyes: External eye exam normal without stare, lid lag or exophthalmos.  EOM intact.  Neck: General: Supple without adenopathy or carotid bruits. Thyroid: Thyroid size normal.  No goiter or nodules appreciated. No thyroid bruit.  Lungs: Clear with good BS bilat with no rales, rhonchi, or wheezes  Heart: RRR with normal S1 and S2 and no gallops; no murmurs; no rub  Abdomen: Normoactive bowel sounds, soft, nontender, without masses or organomegaly palpable  Extremities:  Lower extremities - No pretibial edema. No lesions.  Skin: Normal texture and temperature to palpation.   Neuro: MS is good with appropriate affect, pt is alert and Ox3    DM foot exam:    DATA REVIEWED:  Lab Results  Component Value Date   HGBA1C 9.0 (H) 10/13/2019   HGBA1C 8.2 (H) 10/07/2018   HGBA1C 7.4 (H) 04/25/2018   Lab Results  Component Value Date   MICROALBUR <0.7 10/13/2019   LDLCALC 138 (H) 10/07/2018   CREATININE 0.83 10/13/2019   Lab Results  Component Value Date   MICRALBCREAT 2.0 10/13/2019    Lab Results  Component Value Date   CHOL 229 (H) 10/07/2018   HDL 69.50 10/07/2018   LDLCALC 138 (H) 10/07/2018   LDLDIRECT 120.0 08/06/2017   TRIG 105.0 10/07/2018   CHOLHDL 3 10/07/2018      Results for ERMINIA, MCNEW (MRN [id number]) as of 02/06/2020 13:08  Ref. Range 12/19/2019 13:32  Glutamic Acid Decarb Ab Latest Ref Range: <5 IU/mL >250  (H)    ASSESSMENT / PLAN / RECOMMENDATIONS:   1) Latent Autoimmune  Diabetes Mellitus (LADA), Poorly controlled, Without complications - Most recent A1c of 7.7 %. Goal A1c < 7.0 %.    Plan: GENERAL:  We discussed the autoimmune nature of LADA and the natural progression. She is currently in the "honey mood" period this is why she has not required insulin nor had a DKA yet. We discussed there's no way to predict when she will be out of the "honey moon ' period and will be at risk for DKA. I have  recommended starting insulin therapy, we discussed options such as MDI regimen vs pump   Pt will look in to pump options to familiarize herself. In the meantime I suggest we try insulin Mix rather then MDI regimen, I do not want to overwhelm her with 4 injections a day at this time.   I am going to stop Invokana, as SGLT-2 inhibitors have increased risk of DKA in T1DM  I am also going to stop Metformin, due to C/O severe nausea in the morning to see if this part of it   She was educated on the use of insulin pen today  A prescription for Dexcom will be sent to her pharmacy to see if its covered   MEDICATIONS: -Stop Invokana  - Stop Metformin  - Start Humalog Mix 10 units with Breakfast and 10 units with Supper    EDUCATION / INSTRUCTIONS:  BG monitoring instructions: Patient is instructed to check her blood sugars 2 times a day, fasting and supper.  Call [facility] if: BG persistently < 70 or > 300. . I reviewed the Rule of 15 for the treatment of hypoglycemia in detail with the patient. Literature supplied.   2) Diabetic complications:   Eye: Does not have known diabetic retinopathy. Discussed the importance of having annual exams  Neuro/ Feet: Does not have known diabetic peripheral neuropathy.  Renal: Patient does not have known baseline CKD. She is not on an ACEI/ARB at present.   3) Dyslipidemia: Patient was recently started on lovastatin, tolerating well.  Discussed the cardiovascular benefits.    F/U in 2 months       Signed electronically by: Lyndle Herrlich, MD  [facility] Endocrinology  [facility] [address] [place] [place], [place] [address] Phone: [phone] FAX: [phone]   CC: [name], MD [address] [place] [address] Phone: [phone]  Fax: [phone]    Return to Endocrinology clinic as below: No future appointments.

## 2020-02-06 NOTE — Patient Instructions (Addendum)
-   Stop Invokana  - Stop Metformin  - Start Humalog Mix 10 units with Breakfast and 10 units with Supper      - Exercise: Prior to exercise check your sugar, if less then 150 mg/dL eat a protein bar, if over 150 mg/dL you do not have to eat anything .    Choose healthy, lower carb lower calorie snacks: toss salad, vegetables, cottage cheese, peanut butter, low fat cheese / string cheese, lower sodium deli meat, tuna salad or chicken salad     HOW TO TREAT LOW BLOOD SUGARS (Blood sugar LESS THAN 70 MG/DL)  Please follow the RULE OF 15 for the treatment of hypoglycemia treatment (when your (blood sugars are less than 70 mg/dL)    STEP 1: Take 15 grams of carbohydrates when your blood sugar is low, which includes:   3-4 GLUCOSE TABS  OR  3-4 OZ OF JUICE OR REGULAR SODA OR  ONE TUBE OF GLUCOSE GEL     STEP 2: RECHECK blood sugar in 15 MINUTES STEP 3: If your blood sugar is still low at the 15 minute recheck --> then, go back to STEP 1 and treat AGAIN with another 15 grams of carbohydrates.    Popular insulin pumps : T-slim ( Tandem) and Medtronic pump

## 2020-02-09 ENCOUNTER — Encounter: Payer: Self-pay | Admitting: Internal Medicine

## 2020-02-10 ENCOUNTER — Encounter: Payer: Self-pay | Admitting: Internal Medicine

## 2020-02-11 ENCOUNTER — Encounter: Payer: Self-pay | Admitting: Family Medicine

## 2020-02-11 MED ORDER — FREESTYLE LIBRE 14 DAY SENSOR MISC: 1.0000 | 11 refills | Status: DC

## 2020-02-11 MED ORDER — FLUCONAZOLE 150 MG PO TABS: 150.0000 mg | ORAL_TABLET | Freq: Once | ORAL | 0 refills | Status: AC

## 2020-02-11 MED ORDER — FREESTYLE LIBRE 14 DAY READER DEVI: 1.0000 | 0 refills | Status: DC

## 2020-03-01 ENCOUNTER — Encounter: Payer: Self-pay | Admitting: Internal Medicine

## 2020-03-04 ENCOUNTER — Encounter: Payer: Self-pay | Admitting: Internal Medicine

## 2020-03-04 ENCOUNTER — Other Ambulatory Visit: Payer: Self-pay | Admitting: Internal Medicine

## 2020-03-04 DIAGNOSIS — E139 Other specified diabetes mellitus without complications: Secondary | ICD-10-CM

## 2020-04-06 ENCOUNTER — Encounter: Payer: 59 | Attending: Internal Medicine | Admitting: Nutrition

## 2020-04-06 ENCOUNTER — Other Ambulatory Visit: Payer: Self-pay

## 2020-04-06 DIAGNOSIS — E139 Other specified diabetes mellitus without complications: Secondary | ICD-10-CM | POA: Insufficient documentation

## 2020-04-07 NOTE — Progress Notes (Signed)
Patient is here today to learn about her diabetes and diet.  She is very concerned about her weight gain, and wants to loose the 7 pounds she has gained since starting insulin.   I explained what happens when blood sugars go high, and the need for insulin coverage for meals and basal needs.  She reported good understanding of this.  She was shown how her insulin works, and what meals each dose covers.  She reported good understanding of this. She reports that she is having lows "sometimes in the early morning and treats them with "something sweet-chocolate peanut butter crackers. We discussed the proper treatment of low blood sugars, that are much lower in calories like glucose tablets, telling her that these will make her feel better fast, so she does not overeat.   Handouts on low blood sugar given and stressed the need to document these with finger stick and in log book. Also discussed what insulin dose may need to be reduced if blood sugars drop low during the night, morning, afternoon, and after supper.  She was told that if there is more than one low blood sugar at the same time in one week, that she can reduced her appropriate insulin dose by 1u, and then wait 3-4 days to see if this solves the problem.  She reported good understanding of this.  She was encourage to call the office if there was a questions about which insulin dose/ or how much to reduce.  Written instructions were given for this. We discussed how insulin pumps work, but she was more concerned about her current weight gain, so we discussed the need for balanced meals and how each food group raises blood sugar, and which food group has more calories.  She realized that she was eating more high fat items and she was given suggestions for lower fat options for eating at home and out.  We also discussed the need for exercise, for increasing metabolic rate, reducing blood sugar, and burning calories.  She was encouraged to do this for 30  minutes 5 days/wk

## 2020-04-09 NOTE — Patient Instructions (Signed)
Exercise for 30-40 min. 5 days/wk Test blood sugars before breakfast and supper Record low blood sugar symptoms with a blood sugar test and keep a record of when and how low they are. Call office if having more that on low blood sugar at the same time of day/wk.  Reduce AM dose of insulin by 1unit, if blood sugars drop low after breakfast and anytime before supper. Reduce PM dose of insulin by 1unit, if blood sugars drop low after supper, during the night, or before breakfast. Limit fats in cooking and flavoring, and choose lower fat food options.

## 2020-04-16 ENCOUNTER — Other Ambulatory Visit: Payer: Self-pay

## 2020-04-16 ENCOUNTER — Ambulatory Visit: Payer: 59 | Admitting: Internal Medicine

## 2020-04-16 VITALS — BP 106/68 | HR 68 | Ht 65.0 in | Wt 159.8 lb

## 2020-04-16 DIAGNOSIS — E139 Other specified diabetes mellitus without complications: Secondary | ICD-10-CM

## 2020-04-16 LAB — GLUCOSE, POCT (MANUAL RESULT ENTRY): POC Glucose: 343 mg/dl — AB (ref 70–99)

## 2020-04-16 LAB — POCT GLYCOSYLATED HEMOGLOBIN (HGB A1C): Hemoglobin A1C: 9.2 % — AB (ref 4.0–5.6)

## 2020-04-16 NOTE — Patient Instructions (Signed)
-  Humalog Mix 10 units with Breakfast and 8 units with Supper   - Check sugar before breakfast and dinner        HOW TO TREAT LOW BLOOD SUGARS (Blood sugar LESS THAN 70 MG/DL)  Please follow the RULE OF 15 for the treatment of hypoglycemia treatment (when your (blood sugars are less than 70 mg/dL)    STEP 1: Take 15 grams of carbohydrates when your blood sugar is low, which includes:   3-4 GLUCOSE TABS  OR  3-4 OZ OF JUICE OR REGULAR SODA OR  ONE TUBE OF GLUCOSE GEL     STEP 2: RECHECK blood sugar in 15 MINUTES STEP 3: If your blood sugar is still low at the 15 minute recheck --> then, go back to STEP 1 and treat AGAIN with another 15 grams of carbohydrates.    Popular insulin pumps : T-slim ( Tandem) and Medtronic pump

## 2020-04-16 NOTE — Progress Notes (Signed)
Name: Sierra Lyons  Age/ Sex: 59 y.o., female   MRN/ DOB: [id number], Oct 11, 1960     PCP: [name], MD   Reason for Endocrinology Evaluation: Latent Autoimmune Diabetes Mellitus  Initial Endocrine Consultative Visit: 02/06/2020    PATIENT IDENTIFIER: Sierra Lyons is a 59 y.o. female with a past medical history of Asthma and DM. The patient has followed with Endocrinology clinic since 02/06/2020 for consultative assistance with management of her diabetes.  DIABETIC HISTORY:  Sierra Lyons was diagnosed with T2DM in 2017 at age 74 but by 11/2019 her diagnosis was changed to LADA based on elevated GAD-65 levels. She was initially on metformin and Invokana but this was changed to insulin, metformin stopped due to severe nausea. Her hemoglobin A1c has ranged from 6.2% in 2017, peaking at 13.0% in 2017.   SUBJECTIVE:   During the last visit (02/06/2020): A1c 7.7 %. Stopped Invokana and Metfornmin, started Humalog mix   Today (04/16/2020): Sierra Lyons is here for a follow up on diabetes management.   She checks her blood sugars occasionally . The patient has had hypoglycemic episodes since the last clinic visit, when she eats healthy, which happens overnight, occurs on average 1x month.     HOME DIABETES REGIMEN:  Humalog Mix 10 Units BID - has been taking 8 units BID     Statin: yes ACE-I/ARB: no    METER DOWNLOAD SUMMARY: Did not bring   DIABETIC COMPLICATIONS: Microvascular complications:    Denies: CKD, retinopathy   Last eye exam: Completed 10/2018  Macrovascular complications:    Denies: CAD, PVD, CVA   HISTORY:  Past Medical History:  Past Medical History:  Diagnosis Date  . Asthma   . Chronic rhinitis around 1986  . Diabetes mellitus without complication [facility])    Past Surgical History:  Past Surgical History:  Procedure Laterality Date  . CESAREAN SECTION  1993, 1997, 1999  . VEIN SURGERY  2012    Social History:  reports that she has  never smoked. She has never used smokeless tobacco. She reports that she does not drink alcohol and does not use drugs. Family History:  Family History  Problem Relation Age of Onset  . Diabetes Mother   . Hypertension Mother   . Allergic rhinitis Father   . Allergic rhinitis Sister   . Asthma Sister   . Urticaria Sister   . Diabetes Maternal Grandmother   . Hypertension Maternal Grandmother      HOME MEDICATIONS: Allergies as of 04/16/2020      Reactions   Aspirin Swelling   Swelling of the face       Medication List       Accurate as of April 16, 2020  3:55 PM. If you have any questions, ask your nurse or doctor.        Albuterol Sulfate 108 (90 Base) MCG/ACT Aepb Commonly known as: ProAir RespiClick Inhale 1-2 puffs into the lungs every 4 (four) hours as needed (for cough or wheeze).   Centrum Adults Tabs Take 1 tablet by mouth daily.   fluticasone 50 MCG/ACT nasal spray Commonly known as: FLONASE Place 1 spray into both nostrils daily.   FreeStyle Libre 14 Day Reader Hardie Pulley 1 Device by Does not apply route as directed.   FreeStyle Libre 14 Day Sensor Misc 1 Device by Does not apply route as directed.   ibuprofen 200 MG tablet Commonly known as: ADVIL Take 200 mg by mouth every 6 (six) hours as needed.  Insulin Lispro Prot & Lispro (75-25) 100 UNIT/ML Kwikpen Commonly known as: HumaLOG Mix 75/25 KwikPen Inject 10 Units into the skin 2 (two) times daily with a meal.   Insulin Pen Needle 32G X 4 MM Misc 1 Device by Does not apply route 2 (two) times daily with a meal.   lovastatin 20 MG tablet Commonly known as: MEVACOR Take 1 tablet (20 mg total) by mouth at bedtime.   montelukast 10 MG tablet Commonly known as: SINGULAIR TAKE 1 TABLET BY MOUTH AT  BEDTIME   Symbicort 160-4.5 MCG/ACT inhaler Generic drug: budesonide-formoterol INHALE 2 PUFFS INTO THE LUNGS TWICE DAILY   venlafaxine XR 150 MG 24 hr capsule Commonly known as: EFFEXOR-XR Take 1  capsule (150 mg total) by mouth daily with breakfast.        OBJECTIVE:   Vital Signs: BP 106/68 (BP Location: Left Arm, Patient Position: Sitting, Cuff Size: Normal)   Pulse 68   Ht 5\' 5"  (1.651 m)   Wt 159 lb 12.8 oz (72.5 kg)   SpO2 94%   BMI 26.59 kg/m   Wt Readings from Last 3 Encounters:  04/16/20 159 lb 12.8 oz (72.5 kg)  04/07/20 150 lb 6.4 oz (68.2 kg)  02/06/20 151 lb 9.6 oz (68.8 kg)     Exam: General: Pt appears well and is in NAD  Lungs: Clear with good BS bilat with no rales, rhonchi, or wheezes  Heart: RRR  Abdomen: Normoactive bowel sounds, soft, nontender, without masses or organomegaly palpable  Extremities: No pretibial edema.   Neuro: MS is good with appropriate affect, pt is alert and Ox3    DM foot exam:  04/16/2020  The skin of the feet is intact without sores or ulcerations. The pedal pulses are 2+ on right and 2+ on left. The sensation is intact to a screening 5.07, 10 gram monofilament bilaterally        DATA REVIEWED:  Lab Results  Component Value Date   HGBA1C 9.2 (A) 04/16/2020   HGBA1C 7.7 (A) 02/06/2020   HGBA1C 9.0 (H) 10/13/2019   Lab Results  Component Value Date   MICROALBUR <0.7 10/13/2019   LDLCALC 138 (H) 10/07/2018   CREATININE 0.83 10/13/2019   Lab Results  Component Value Date   MICRALBCREAT 2.0 10/13/2019     Lab Results  Component Value Date   CHOL 229 (H) 10/07/2018   HDL 69.50 10/07/2018   LDLCALC 138 (H) 10/07/2018   LDLDIRECT 120.0 08/06/2017   TRIG 105.0 10/07/2018   CHOLHDL 3 10/07/2018       Results for NATELIE, OSTROSKY (MRN [name]) as of 02/06/2020 13:08  Ref. Range 12/19/2019 13:32  Glutamic Acid Decarb Ab Latest Ref Range: <5 IU/mL >250 (H)   In-office BG 343 mg/dL   ASSESSMENT / PLAN / RECOMMENDATIONS:   1) Latent Autoimmune  Diabetes Mellitus (LADA), Poorly controlled, Without complications - Most recent A1c of 9.2 %. Goal A1c < 7.0 %.     - Pt with worsening hyperglycemia. She  has not been checking glucose regularly and had self reduced insulin dose based on symptoms and also would take a sugar tablet based on symptoms rather blood glucose and her BG's have been trending up . - We emphasized the importance of glucose checks at home  - Bg in the office 343 mg/dL after taking 8 units of insulin mix with breakfast  - We discussed insulin pumps today but she would like to continue on this at this time - Intolerant to  Metformin due to sever nausea  - Insurance declined CGM coverage     MEDICATIONS: -Humalog Mix 10 units with Breakfast and 8 units with Supper    EDUCATION / INSTRUCTIONS:  BG monitoring instructions: Patient is instructed to check her blood sugars 2 times a day, before breakfast and supper.  Call [place] [facility] clinic if: BG persistently < 70  . I reviewed the Rule of 15 for the treatment of hypoglycemia in detail with the patient. Literature supplied.    2) Diabetic complications:   Eye: Does not have known diabetic retinopathy.   Neuro/ Feet: Does not have known diabetic peripheral neuropathy .   Renal: Patient does not have known baseline CKD. She   is not on an ACEI/ARB at present.      F/U in 3 months    Signed electronically by: Lyndle Herrlich, MD  [facility] Endocrinology  [facility] [address] [place] [place], [place] [address] Phone: [phone] FAX: [phone]   CC: [name], MD [address] [place] [address] Phone: [phone]  Fax: [phone]  Return to Endocrinology clinic as below: Future Appointments  Date Time Provider Department Center  07/19/2020  7:50 AM Malaney Mcbean, Konrad Dolores, MD [id number] None

## 2020-05-18 NOTE — Progress Notes (Addendum)
[facility] [address], [address] [place], [place] [address] [phone]  Date:  05/19/2020   Name:  Sierra Lyons   DOB:  May 05, 1961   MRN:  [id number]  PCP:  [name], MD    Chief Complaint: Abdominal Pain (left lower abdominal pain, unable to move much, off and on, no n/v) and Shoulder Pain (Left should pain, radiates to back)   History of Present Illness:  Sierra Lyons is a 59 y.o. very pleasant female patient who presents with the following:  Patient here today with concern of possible hernia Last seen by myself in January History of hyperlipidemia, asthma and allergies, LADA type diabetes  She is under the care of endocrinology, Dr. Thelma Barge recent visit about 1 month ago-adjustments made to treatment due to hyperglycemia Meribeth continues to struggle with her blood sugar, she is frustrated that she cannot get a continuous blood glucose monitor-insurance does not cover  Eye exam- she will schedule this soon  COVID-19 vaccine-  Done  Mammogram- scheduled for this week  Foot exam due Shingrix  Ran out of lovastatin a month or so ago  She started doing some yoga a few months ago for exercise She developed a severe RL abd/ hip pain - this occurred 2 weeks ago, sudden onset while she was doing her yoga practice.  She was doing opposed which works the abdominal.  She stopped the exercise, rested and felt somewhat better The pain will now wax and wane  No bulge noted No GI symptoms  She and her longtime boyfriend got married over the summer, they are doing very well.  Lab Results  Component Value Date   HGBA1C 9.2 (A) 04/16/2020     Patient Active Problem List   Diagnosis Date Noted  . Latent autoimmune diabetes in adults (LADA), managed as type 1 (HCC) 02/06/2020  . Dyslipidemia 02/06/2020  . Overweight 01/05/2016  . Moderate persistent asthma 09/07/2015  . Allergic rhinitis 08/10/2015     Past Medical History:  Diagnosis Date  . Asthma   . Chronic rhinitis around 1986  . Diabetes mellitus without complication [facility])     Past Surgical History:  Procedure Laterality Date  . CESAREAN SECTION  1993, 1997, 1999  . VEIN SURGERY  2012    Social History   Tobacco Use  . Smoking status: Never Smoker  . Smokeless tobacco: Never Used  Substance Use Topics  . Alcohol use: No  . Drug use: No    Family History  Problem Relation Age of Onset  . Diabetes Mother   . Hypertension Mother   . Allergic rhinitis Father   . Allergic rhinitis Sister   . Asthma Sister   . Urticaria Sister   . Diabetes Maternal Grandmother   . Hypertension Maternal Grandmother     Allergies  Allergen Reactions  . Aspirin Swelling    Swelling of the face     Medication list has been reviewed and updated.  Current Outpatient Medications on File Prior to Visit  Medication Sig Dispense Refill  . Albuterol Sulfate (PROAIR RESPICLICK) 108 (90 BASE) MCG/ACT AEPB Inhale 1-2 puffs into the lungs every 4 (four) hours as needed (for cough or wheeze). 1 each 1  . fluticasone (FLONASE) 50 MCG/ACT nasal spray Place 1 spray into both nostrils daily. 16 g 5  . ibuprofen (ADVIL,MOTRIN) 200 MG tablet Take 200 mg by mouth every 6 (six) hours as needed.    Marland Kitchen  Insulin Lispro Prot & Lispro (HUMALOG MIX 75/25 KWIKPEN) (75-25) 100 UNIT/ML Kwikpen Inject 10 Units into the skin 2 (two) times daily with a meal. 15 mL 11  . Insulin Pen Needle 32G X 4 MM MISC 1 Device by Does not apply route 2 (two) times daily with a meal. 100 each 6  . lovastatin (MEVACOR) 20 MG tablet Take 1 tablet (20 mg total) by mouth at bedtime. 90 tablet 3  . montelukast (SINGULAIR) 10 MG tablet TAKE 1 TABLET BY MOUTH AT  BEDTIME 90 tablet 0  . Multiple Vitamins-Minerals (CENTRUM ADULTS) TABS Take 1 tablet by mouth daily.    . SYMBICORT 160-4.5 MCG/ACT inhaler INHALE 2 PUFFS INTO THE LUNGS TWICE DAILY 10.2 g 0  . venlafaxine XR  (EFFEXOR-XR) 150 MG 24 hr capsule Take 1 capsule (150 mg total) by mouth daily with breakfast. 90 capsule 3   No current facility-administered medications on file prior to visit.    Review of Systems:  As per HPI- otherwise negative.   Physical Examination: Vitals:   05/19/20 1356  BP: 102/60  Pulse: 88  Resp: 16  Temp: 98.1 F (36.7 C)  SpO2: 98%   Vitals:   05/19/20 1356  Weight: 165 lb (74.8 kg)  Height: 5\' 5"  (1.651 m)   Body mass index is 27.46 kg/m. Ideal Body Weight: Weight in (lb) to have BMI = 25: 149.9  GEN: no acute distress.  Mild overweight, looks well HEENT: Atraumatic, Normocephalic.  Ears and Nose: No external deformity. CV: RRR, No M/G/R. No JVD. No thrill. No extra heart sounds. PULM: CTA B, no wheezes, crackles, rhonchi. No retractions. No resp. distress. No accessory muscle use. ABD: S, NT, ND, +BS. No rebound. No HSM.  Belly is benign At this time I am not able to reproduce any tenderness of the abdominal or hip flexor muscles.  No pain to resisted hip flexion, hip range of motion is normal and not painful No bulge or hernia is noted in the abdomen or inguinal area EXTR: No c/c/e PSYCH: Normally interactive. Conversant.    Assessment and Plan: Controlled type 2 diabetes mellitus with diabetic neuropathy, without long-term current use of insulin (HCC) - Plan: Comprehensive metabolic panel  Screening for deficiency anemia - Plan: CBC  Screening for thyroid disorder - Plan: TSH  Dyslipidemia - Plan: Lipid panel  Immunization due - Plan: Varicella-zoster vaccine IM (Shingrix)  Strain of abdominal muscle, initial encounter  Patient here today with a couple of concerns Follow-up routine labs today A first dose of shingles vaccine Discussed COVID-19 booster shots  Suspect she has a muscle tear of the left-sided hip flexors, likely occurred during yoga.  At this time I do not see any definite evidence of a hernia Encouraged her to try gentle  stretches for the area, gradually get back into her yoga routine.  If the pain returns, or she does notice any bulge she will let me  Will plan further follow- up pending labs.  This visit occurred during the SARS-CoV-2 public health emergency.  Safety protocols were in place, including screening questions prior to the visit, additional usage of staff PPE, and extensive cleaning of exam room while observing appropriate contact time as indicated for disinfecting solutions.    Signed , MD  Addendum 8/26, received her labs as below  Results for orders placed or performed in visit on 05/19/20  CBC  Result Value Ref Range   WBC 5.9 3.8 - 10.8 Thousand/uL   RBC 4.43  3.80 - 5.10 Million/uL   Hemoglobin 13.7 11.7 - 15.5 g/dL   HCT 62.9 35 - 45 %   MCV 92.8 80.0 - 100.0 fL   MCH 30.9 27.0 - 33.0 pg   MCHC 33.3 32.0 - 36.0 g/dL   RDW 52.8 41.3 - 24.4 %   Platelets 265 140 - 400 Thousand/uL   MPV 11.2 7.5 - 12.5 fL  Comprehensive metabolic panel  Result Value Ref Range   Glucose, Bld 140 (H) 65 - 99 mg/dL   BUN 24 7 - 25 mg/dL   Creat 0.10 2.72 - 5.36 mg/dL   BUN/Creatinine Ratio NOT APPLICABLE 6 - 22 (calc)   Sodium 140 135 - 146 mmol/L   Potassium 4.5 3.5 - 5.3 mmol/L   Chloride 102 98 - 110 mmol/L   CO2 30 20 - 32 mmol/L   Calcium 9.9 8.6 - 10.4 mg/dL   Total Protein 6.6 6.1 - 8.1 g/dL   Albumin 4.3 3.6 - 5.1 g/dL   Globulin 2.3 1.9 - 3.7 g/dL (calc)   AG Ratio 1.9 1.0 - 2.5 (calc)   Total Bilirubin 0.2 0.2 - 1.2 mg/dL   Alkaline phosphatase (APISO) 52 37 - 153 U/L   AST 14 10 - 35 U/L   ALT 12 6 - 29 U/L  Lipid panel  Result Value Ref Range   Cholesterol 217 (H) <200 mg/dL   HDL 81 > OR = 50 mg/dL   Triglycerides 644 <034 mg/dL   LDL Cholesterol (Calc) 114 (H) mg/dL (calc)   Total CHOL/HDL Ratio 2.7 <5.0 (calc)   Non-HDL Cholesterol (Calc) 136 (H) <130 mg/dL (calc)  TSH  Result Value Ref Range   TSH 0.71 0.40 - 4.50 mIU/L

## 2020-05-18 NOTE — Telephone Encounter (Signed)
error 

## 2020-05-19 ENCOUNTER — Encounter: Payer: Self-pay | Admitting: Family Medicine

## 2020-05-19 ENCOUNTER — Other Ambulatory Visit: Payer: Self-pay

## 2020-05-19 ENCOUNTER — Ambulatory Visit: Payer: 59 | Admitting: Family Medicine

## 2020-05-19 VITALS — BP 102/60 | HR 88 | Temp 98.1°F | Resp 16 | Ht 65.0 in | Wt 165.0 lb

## 2020-05-19 DIAGNOSIS — E785 Hyperlipidemia, unspecified: Secondary | ICD-10-CM

## 2020-05-19 DIAGNOSIS — S39011A Strain of muscle, fascia and tendon of abdomen, initial encounter: Secondary | ICD-10-CM

## 2020-05-19 DIAGNOSIS — E1140 Type 2 diabetes mellitus with diabetic neuropathy, unspecified: Secondary | ICD-10-CM

## 2020-05-19 DIAGNOSIS — Z23 Encounter for immunization: Secondary | ICD-10-CM

## 2020-05-19 DIAGNOSIS — Z130 Encounter for screening for diseases of the blood and blood-forming organs and certain disorders involving the immune mechanism: Secondary | ICD-10-CM | POA: Diagnosis not present

## 2020-05-19 DIAGNOSIS — Z1329 Encounter for screening for other suspected endocrine disorder: Secondary | ICD-10-CM | POA: Diagnosis not present

## 2020-05-19 LAB — COMPREHENSIVE METABOLIC PANEL
AG Ratio: 1.9 (calc) (ref 1.0–2.5)
ALT: 12 U/L (ref 6–29)
AST: 14 U/L (ref 10–35)
Albumin: 4.3 g/dL (ref 3.6–5.1)
Alkaline phosphatase (APISO): 52 U/L (ref 37–153)
BUN: 24 mg/dL (ref 7–25)
CO2: 30 mmol/L (ref 20–32)
Calcium: 9.9 mg/dL (ref 8.6–10.4)
Chloride: 102 mmol/L (ref 98–110)
Creat: 0.89 mg/dL (ref 0.50–1.05)
Globulin: 2.3 g/dL (calc) (ref 1.9–3.7)
Glucose, Bld: 140 mg/dL — ABNORMAL HIGH (ref 65–99)
Potassium: 4.5 mmol/L (ref 3.5–5.3)
Sodium: 140 mmol/L (ref 135–146)
Total Bilirubin: 0.2 mg/dL (ref 0.2–1.2)
Total Protein: 6.6 g/dL (ref 6.1–8.1)

## 2020-05-19 LAB — CBC
HCT: 41.1 % (ref 35.0–45.0)
Hemoglobin: 13.7 g/dL (ref 11.7–15.5)
MCH: 30.9 pg (ref 27.0–33.0)
MCHC: 33.3 g/dL (ref 32.0–36.0)
MCV: 92.8 fL (ref 80.0–100.0)
MPV: 11.2 fL (ref 7.5–12.5)
Platelets: 265 10*3/uL (ref 140–400)
RBC: 4.43 10*6/uL (ref 3.80–5.10)
RDW: 12.9 % (ref 11.0–15.0)
WBC: 5.9 10*3/uL (ref 3.8–10.8)

## 2020-05-19 LAB — LIPID PANEL
Cholesterol: 217 mg/dL — ABNORMAL HIGH (ref <200)
HDL: 81 mg/dL (ref >=50)
LDL Cholesterol (Calc): 114 mg/dL (calc) — ABNORMAL HIGH
Non-HDL Cholesterol (Calc): 136 mg/dL (calc) — ABNORMAL HIGH (ref <130)
Total CHOL/HDL Ratio: 2.7 (calc) (ref <5.0)
Triglycerides: 117 mg/dL (ref <150)

## 2020-05-19 LAB — TSH: TSH: 0.71 mIU/L (ref 0.40–4.50)

## 2020-05-19 NOTE — Patient Instructions (Signed)
Good to see you again today!  I will be in touch with your labs First shingrix today- 2nd dose as a nurse visit Please see me in about 6 months, take care

## 2020-05-20 ENCOUNTER — Encounter: Payer: Self-pay | Admitting: Family Medicine

## 2020-05-21 ENCOUNTER — Other Ambulatory Visit (HOSPITAL_BASED_OUTPATIENT_CLINIC_OR_DEPARTMENT_OTHER): Payer: Self-pay | Admitting: Family Medicine

## 2020-05-21 ENCOUNTER — Encounter (HOSPITAL_BASED_OUTPATIENT_CLINIC_OR_DEPARTMENT_OTHER): Payer: 59

## 2020-05-21 DIAGNOSIS — Z1231 Encounter for screening mammogram for malignant neoplasm of breast: Secondary | ICD-10-CM

## 2020-05-25 ENCOUNTER — Ambulatory Visit (HOSPITAL_BASED_OUTPATIENT_CLINIC_OR_DEPARTMENT_OTHER)
Admission: RE | Admit: 2020-05-25 | Discharge: 2020-05-25 | Disposition: A | Discharge status: Home / Self Care | Payer: 59 | Source: Ambulatory Visit | Attending: Family Medicine | Admitting: Family Medicine

## 2020-05-25 ENCOUNTER — Encounter (HOSPITAL_BASED_OUTPATIENT_CLINIC_OR_DEPARTMENT_OTHER): Payer: Self-pay

## 2020-05-25 ENCOUNTER — Other Ambulatory Visit: Payer: Self-pay

## 2020-05-25 DIAGNOSIS — Z1231 Encounter for screening mammogram for malignant neoplasm of breast: Secondary | ICD-10-CM | POA: Diagnosis present

## 2020-07-19 ENCOUNTER — Ambulatory Visit: Payer: 59 | Admitting: Internal Medicine

## 2020-07-19 ENCOUNTER — Other Ambulatory Visit: Payer: Self-pay

## 2020-07-19 ENCOUNTER — Encounter: Payer: Self-pay | Admitting: Internal Medicine

## 2020-07-19 VITALS — Ht 65.0 in | Wt 168.0 lb

## 2020-07-19 DIAGNOSIS — E1065 Type 1 diabetes mellitus with hyperglycemia: Secondary | ICD-10-CM | POA: Diagnosis not present

## 2020-07-19 DIAGNOSIS — E139 Other specified diabetes mellitus without complications: Secondary | ICD-10-CM | POA: Diagnosis not present

## 2020-07-19 LAB — POCT GLYCOSYLATED HEMOGLOBIN (HGB A1C): Hemoglobin A1C: 9.1 % — AB (ref 4.0–5.6)

## 2020-07-19 LAB — GLUCOSE, POCT (MANUAL RESULT ENTRY): POC Glucose: 316 mg/dl — AB (ref 70–99)

## 2020-07-19 MED ORDER — TRULICITY 0.75 MG/0.5ML ~~LOC~~ SOAJ: 0.7500 mg | SUBCUTANEOUS | 6 refills | Status: DC

## 2020-07-19 NOTE — Patient Instructions (Addendum)
-   Humalog Mix 10 units with Breakfast and 8 units with Supper  - Start Trulicity 0.75 mg weekly   - Check sugar before breakfast and dinner        HOW TO TREAT LOW BLOOD SUGARS (Blood sugar LESS THAN 70 MG/DL)  Please follow the RULE OF 15 for the treatment of hypoglycemia treatment (when your (blood sugars are less than 70 mg/dL)    STEP 1: Take 15 grams of carbohydrates when your blood sugar is low, which includes:   3-4 GLUCOSE TABS  OR  3-4 OZ OF JUICE OR REGULAR SODA OR  ONE TUBE OF GLUCOSE GEL     STEP 2: RECHECK blood sugar in 15 MINUTES STEP 3: If your blood sugar is still low at the 15 minute recheck --> then, go back to STEP 1 and treat AGAIN with another 15 grams of carbohydrates.    Popular insulin pumps : T-slim ( Tandem) and Medtronic pump

## 2020-07-19 NOTE — Progress Notes (Signed)
Name: Sierra Lyons  Age/ Sex: 59 y.o., female   MRN/ DOB: [id number], 02-04-1961     PCP: [name], MD   Reason for Endocrinology Evaluation: Latent Autoimmune Diabetes Mellitus  Initial Endocrine Consultative Visit: 02/06/2020    PATIENT IDENTIFIER: Sierra Lyons is a 59 y.o. female with a past medical history of Asthma and DM. The patient has followed with Endocrinology clinic since 02/06/2020 for consultative assistance with management of her diabetes.  DIABETIC HISTORY:  Sierra Lyons was diagnosed with T2DM in 2017 at age 28 but by 11/2019 her diagnosis was changed to LADA based on elevated GAD-65 levels. She was initially on metformin and Invokana but this was changed to insulin, metformin stopped due to severe nausea. Her hemoglobin A1c has ranged from 6.2% in 2017, peaking at 13.0% in 2017.   SUBJECTIVE:   During the last visit (04/16/2020): A1c 9.2 %. Stopped Invokana and Metfornmin, started Humalog mix   Today (07/19/2020): Sierra Lyons is here for a follow up on diabetes management.   She checks her sugars rarely . The patient endorses  hypoglycemic episodes since the last clinic visit, 3-4x a week , by noon time. This is based on symptoms    HOME DIABETES REGIMEN:  Humalog Mix 10 Units QAM and 8 units with Supper     Statin: yes ACE-I/ARB: no    METER DOWNLOAD SUMMARY: Did not bring   DIABETIC COMPLICATIONS: Microvascular complications:    Denies: CKD, retinopathy   Last eye exam: Completed 10/2018  Macrovascular complications:    Denies: CAD, PVD, CVA   HISTORY:  Past Medical History:  Past Medical History:  Diagnosis Date  . Asthma   . Chronic rhinitis around 1986  . Diabetes mellitus without complication [facility])    Past Surgical History:  Past Surgical History:  Procedure Laterality Date  . CESAREAN SECTION  1993, 1997, 1999  . VEIN SURGERY  2012    Social History:  reports that she has never smoked. She has never used  smokeless tobacco. She reports that she does not drink alcohol and does not use drugs. Family History:  Family History  Problem Relation Age of Onset  . Diabetes Mother   . Hypertension Mother   . Allergic rhinitis Father   . Allergic rhinitis Sister   . Asthma Sister   . Urticaria Sister   . Diabetes Maternal Grandmother   . Hypertension Maternal Grandmother      HOME MEDICATIONS: Allergies as of 07/19/2020      Reactions   Aspirin Swelling   Swelling of the face       Medication List       Accurate as of July 19, 2020  8:24 AM. If you have any questions, ask your nurse or doctor.        Albuterol Sulfate 108 (90 Base) MCG/ACT Aepb Commonly known as: ProAir RespiClick Inhale 1-2 puffs into the lungs every 4 (four) hours as needed (for cough or wheeze).   Centrum Adults Tabs Take 1 tablet by mouth daily.   fluticasone 50 MCG/ACT nasal spray Commonly known as: FLONASE Place 1 spray into both nostrils daily.   ibuprofen 200 MG tablet Commonly known as: ADVIL Take 200 mg by mouth every 6 (six) hours as needed.   Insulin Lispro Prot & Lispro (75-25) 100 UNIT/ML Kwikpen Commonly known as: HumaLOG Mix 75/25 KwikPen Inject 10 Units into the skin 2 (two) times daily with a meal.   Insulin Pen Needle 32G X 4 MM  Misc 1 Device by Does not apply route 2 (two) times daily with a meal.   lovastatin 20 MG tablet Commonly known as: MEVACOR Take 1 tablet (20 mg total) by mouth at bedtime.   montelukast 10 MG tablet Commonly known as: SINGULAIR TAKE 1 TABLET BY MOUTH AT  BEDTIME   Symbicort 160-4.5 MCG/ACT inhaler Generic drug: budesonide-formoterol INHALE 2 PUFFS INTO THE LUNGS TWICE DAILY   Trulicity 0.75 MG/0.5ML Sopn Generic drug: Dulaglutide Inject 0.75 mg into the skin once a week. Started by: Scarlette Shorts, MD   venlafaxine XR 150 MG 24 hr capsule Commonly known as: EFFEXOR-XR Take 1 capsule (150 mg total) by mouth daily with breakfast.         OBJECTIVE:   Vital Signs: Ht 5\' 5"  (1.651 m)   Wt 168 lb (76.2 kg)   BMI 27.96 kg/m   Wt Readings from Last 3 Encounters:  07/19/20 168 lb (76.2 kg)  05/19/20 165 lb (74.8 kg)  04/16/20 159 lb 12.8 oz (72.5 kg)     Exam: General: Pt appears well and is in NAD  Lungs: Clear with good BS bilat with no rales, rhonchi, or wheezes  Heart: RRR  Abdomen: Normoactive bowel sounds, soft, nontender, without masses or organomegaly palpable  Extremities: No pretibial edema.   Neuro: MS is good with appropriate affect, pt is alert and Ox3    DM foot exam:  04/16/2020  The skin of the feet is intact without sores or ulcerations. The pedal pulses are 2+ on right and 2+ on left. The sensation is intact to a screening 5.07, 10 gram monofilament bilaterally        DATA REVIEWED:  Lab Results  Component Value Date   HGBA1C 9.1 (A) 07/19/2020   HGBA1C 9.2 (A) 04/16/2020   HGBA1C 7.7 (A) 02/06/2020   Lab Results  Component Value Date   MICROALBUR <0.7 10/13/2019   LDLCALC 114 (H) 05/19/2020   CREATININE 0.89 05/19/2020   Lab Results  Component Value Date   MICRALBCREAT 2.0 10/13/2019     Lab Results  Component Value Date   CHOL 217 (H) 05/19/2020   HDL 81 05/19/2020   LDLCALC 114 (H) 05/19/2020   LDLDIRECT 120.0 08/06/2017   TRIG 117 05/19/2020   CHOLHDL 2.7 05/19/2020       Results for DONNAMARIA, SHANDS (MRN [name]) as of 02/06/2020 13:08  Ref. Range 12/19/2019 13:32  Glutamic Acid Decarb Ab Latest Ref Range: <5 IU/mL >250 (H)   In-office BG 318 mg/dL   ASSESSMENT / PLAN / RECOMMENDATIONS:   1) Latent Autoimmune  Diabetes Mellitus (LADA), Poorly controlled, Without complications - Most recent A1c of 9.1 %. Goal A1c < 7.0 %.     - Pt continues with hyperglycemia  - She unfortunately admits to dietary indiscretions and lack of glucose checks, insurance did not cover for CGM  - We again emphasized the importance of glucose checks at home and having this  data available to make proper management decisions.  - Bg in the office 318 mg/dL after taking 10 units of insulin mix with breakfast  - She had declined insulin pumps in the past and MDI regimen which will give her more flexibility.  - She agreed to starting a GLP-1 agonists , discussed GI side effects, she has no personal Hx of pancreatitis  - Intolerant to Metformin due to sever nausea  - We again discussed the risk of microscopic complications with persistent hyperglycemia     MEDICATIONS: -Continue Humalog  Mix 10 units with Breakfast and 8 units with Supper  - Start Trulicity 0.75 mg weekly    EDUCATION / INSTRUCTIONS:  BG monitoring instructions: Patient is instructed to check her blood sugars 2 times a day, before breakfast and supper.  Call [facility] if: BG persistently < 70  . I reviewed the Rule of 15 for the treatment of hypoglycemia in detail with the patient. Literature supplied.    2) Diabetic complications:   Eye: Does not have known diabetic retinopathy.   Neuro/ Feet: Does not have known diabetic peripheral neuropathy .   Renal: Patient does not have known baseline CKD. She   is not on an ACEI/ARB at present.      F/U in 3 months    Signed electronically by: Lyndle Herrlich, MD  [facility]  [facility] [address] [place] [place], [place] [address] Phone: [phone] FAX: [phone]   CC: [name], MD [address] [place] [address] Phone: [phone]  Fax: [phone]  Return to Endocrinology clinic as below: Future Appointments  Date Time Provider Department Center  11/01/2020  8:40 AM Copland, [name], MD [id number]

## 2020-07-20 DIAGNOSIS — E1065 Type 1 diabetes mellitus with hyperglycemia: Secondary | ICD-10-CM | POA: Insufficient documentation

## 2020-07-20 HISTORY — DX: Type 1 diabetes mellitus with hyperglycemia: E10.65

## 2020-09-06 ENCOUNTER — Encounter: Payer: Self-pay | Admitting: Allergy and Immunology

## 2020-09-06 ENCOUNTER — Other Ambulatory Visit: Payer: Self-pay

## 2020-09-06 ENCOUNTER — Ambulatory Visit: Payer: 59 | Admitting: Allergy and Immunology

## 2020-09-06 VITALS — BP 100/70 | HR 94 | Temp 97.9°F | Resp 16 | Ht 64.57 in | Wt 173.1 lb

## 2020-09-06 DIAGNOSIS — J3089 Other allergic rhinitis: Secondary | ICD-10-CM | POA: Diagnosis not present

## 2020-09-06 DIAGNOSIS — J4541 Moderate persistent asthma with (acute) exacerbation: Secondary | ICD-10-CM | POA: Diagnosis not present

## 2020-09-06 MED ORDER — MONTELUKAST SODIUM 10 MG PO TABS
10.0000 mg | ORAL_TABLET | Freq: Every day | ORAL | 1 refills | Status: DC
Start: 2020-09-06 — End: 2021-09-07

## 2020-09-06 MED ORDER — BREZTRI AEROSPHERE 160-9-4.8 MCG/ACT IN AERO: 2.0000 | INHALATION_SPRAY | Freq: Two times a day (BID) | RESPIRATORY_TRACT | 5 refills | Status: DC

## 2020-09-06 MED ORDER — PROAIR RESPICLICK 108 (90 BASE) MCG/ACT IN AEPB
1.0000 | INHALATION_SPRAY | RESPIRATORY_TRACT | 1 refills | Status: DC | PRN
Start: 2020-09-06 — End: 2020-09-07

## 2020-09-06 MED ORDER — FLUTICASONE PROPIONATE 50 MCG/ACT NA SUSP
1.0000 | Freq: Every day | NASAL | 5 refills | Status: DC | PRN
Start: 2020-09-06 — End: 2021-09-07

## 2020-09-06 NOTE — Patient Instructions (Addendum)
Moderate persistent asthma  Prednisone has been provided, 40 mg x3 days, 20 mg x1 day, 10 mg x1 day, then stop.  A prescription and samples have been provided for BrezTri, 160 mcg, 2 inhalations twice daily.  To maximize pulmonary deposition, a spacer has been provided along with instructions for its proper administration with an HFA inhaler.  Restart montelukast 10 mg daily.  A refill prescription has been provided.  Continue albuterol HFA, 1 to 2 inhalations every 4-6 hours if needed.  The patient has been asked to contact me if her symptoms persist or progress. Otherwise, she may return for follow up in 3 months.  Allergic rhinitis  Continue appropriate allergen avoidance measures.  Montelukast has been prescribed (as above).  Continue fluticasone nasal spray, one spray per nostril 1-2 times daily as needed.   Nasal saline spray (i.e., Simply Saline) or nasal saline lavage (i.e., NeilMed) is recommended as needed and prior to medicated nasal sprays.   Follow up in about 3 months (around 12/05/2020), or sooner if symptoms worsen or fail to improve.

## 2020-09-06 NOTE — Assessment & Plan Note (Signed)
   Prednisone has been provided, 40 mg x3 days, 20 mg x1 day, 10 mg x1 day, then stop.  A prescription and samples have been provided for BrezTri, 160 mcg, 2 inhalations twice daily.  To maximize pulmonary deposition, a spacer has been provided along with instructions for its proper administration with an HFA inhaler.  Restart montelukast 10 mg daily.  A refill prescription has been provided.  Continue albuterol HFA, 1 to 2 inhalations every 4-6 hours if needed.  The patient has been asked to contact me if her symptoms persist or progress. Otherwise, she may return for follow up in 3 months.

## 2020-09-06 NOTE — Progress Notes (Signed)
Follow-up Note  RE: Sierra Lyons MRN: [id number] DOB: [birth year] Date of Office Visit: 09/06/2020  Primary care provider: [name], MD Referring provider: [name], MD  History of present illness: Sierra Lyons is a 59 y.o. female with persistent asthma and allergic rhinitis presenting today for a sick visit. She was last seen in this clinic in August 2019.  She reports that over the past 2 months she has experienced more frequent episodes of coughing, wheezing, chest tightness, and dyspnea.  The symptoms have particularly progressed over the past 2 weeks.  She has been experiencing symptoms daily and has been awakened from sleep 4 nights during this past week due to lower respiratory symptoms.  She is currently taking Symbicort 160-4.5 micro grams, 2 inhalations twice daily.  She admits that she is not using a spacer device because it broke.  In addition, she had discontinue the montelukast several months ago because she had been doing well at that time.  Her nasal allergy symptoms are stable with fluticasone nasal spray as needed.  Assessment and plan: Moderate persistent asthma  Prednisone has been provided, 40 mg x3 days, 20 mg x1 day, 10 mg x1 day, then stop.  A prescription and samples have been provided for BrezTri, 160 mcg, 2 inhalations twice daily.  To maximize pulmonary deposition, a spacer has been provided along with instructions for its proper administration with an HFA inhaler.  Restart montelukast 10 mg daily.  A refill prescription has been provided.  Continue albuterol HFA, 1 to 2 inhalations every 4-6 hours if needed.  The patient has been asked to contact me if her symptoms persist or progress. Otherwise, she may return for follow up in 3 months.  Allergic rhinitis  Continue appropriate allergen avoidance measures.  Montelukast has been prescribed (as above).  Continue fluticasone nasal spray, one spray per nostril 1-2 times daily as  needed.   Nasal saline spray (i.e., Simply Saline) or nasal saline lavage (i.e., NeilMed) is recommended as needed and prior to medicated nasal sprays.   Meds ordered this encounter  Medications  . Budeson-Glycopyrrol-Formoterol (BREZTRI AEROSPHERE) 160-9-4.8 MCG/ACT AERO    Sig: Inhale 2 puffs into the lungs 2 (two) times daily. Use with spacer. Rinse, gargle and spit out after use.    Dispense:  10.7 g    Refill:  5  . montelukast (SINGULAIR) 10 MG tablet    Sig: Take 1 tablet (10 mg total) by mouth at bedtime.    Dispense:  90 tablet    Refill:  1  . Albuterol Sulfate (PROAIR RESPICLICK) 108 (90 Base) MCG/ACT AEPB    Sig: Inhale 1-2 puffs into the lungs every 4 (four) hours as needed (for cough or wheeze).    Dispense:  1 each    Refill:  1  . fluticasone (FLONASE) 50 MCG/ACT nasal spray    Sig: Place 1 spray into both nostrils daily as needed.    Dispense:  16 g    Refill:  5    Diagnostics: Spirometry reveals an FVC of 2.44 L and an FEV1 of 1.84 L (67% predicted) with significant postbronchodilator improvement.  Please see scanned spirometry results for details.    Physical examination: Blood pressure 100/70, pulse 94, temperature 97.9 F (36.6 C), temperature source Tympanic, resp. rate 16, height 5' 4.57" (1.64 m), weight 173 lb 1 oz (78.5 kg), SpO2 98 %.  General: Alert, interactive, in no acute distress. HEENT: TMs pearly gray, turbinates mildly edematous without discharge,  post-pharynx unremarkable. Neck: Supple without lymphadenopathy. Lungs: Clear to auscultation without wheezing, rhonchi or rales. CV: Normal S1, S2 without murmurs. Skin: Warm and dry, without lesions or rashes.  The following portions of the patient's history were reviewed and updated as appropriate: allergies, current medications, past family history, past medical history, past social history, past surgical history and problem list.  Current Outpatient Medications  Medication Sig Dispense  Refill  . Dulaglutide (TRULICITY) 0.75 MG/0.5ML SOPN Inject 0.75 mg into the skin once a week. 2 mL 6  . ibuprofen (ADVIL,MOTRIN) 200 MG tablet Take 200 mg by mouth every 6 (six) hours as needed.    . Insulin Lispro Prot & Lispro (HUMALOG MIX 75/25 KWIKPEN) (75-25) 100 UNIT/ML Kwikpen Inject 10 Units into the skin 2 (two) times daily with a meal. 15 mL 11  . Insulin Pen Needle 32G X 4 MM MISC 1 Device by Does not apply route 2 (two) times daily with a meal. 100 each 6  . Albuterol Sulfate (PROAIR RESPICLICK) 108 (90 Base) MCG/ACT AEPB Inhale 1-2 puffs into the lungs every 4 (four) hours as needed (for cough or wheeze). 1 each 1  . Budeson-Glycopyrrol-Formoterol (BREZTRI AEROSPHERE) 160-9-4.8 MCG/ACT AERO Inhale 2 puffs into the lungs 2 (two) times daily. Use with spacer. Rinse, gargle and spit out after use. 10.7 g 5  . fluticasone (FLONASE) 50 MCG/ACT nasal spray Place 1 spray into both nostrils daily as needed. 16 g 5  . lovastatin (MEVACOR) 20 MG tablet Take 1 tablet (20 mg total) by mouth at bedtime. (Patient not taking: Reported on 09/06/2020) 90 tablet 3  . montelukast (SINGULAIR) 10 MG tablet Take 1 tablet (10 mg total) by mouth at bedtime. 90 tablet 1  . Multiple Vitamins-Minerals (CENTRUM ADULTS) TABS Take 1 tablet by mouth daily. (Patient not taking: Reported on 09/06/2020)    . SYMBICORT 160-4.5 MCG/ACT inhaler INHALE 2 PUFFS INTO THE LUNGS TWICE DAILY (Patient not taking: Reported on 09/06/2020) 10.2 g 0  . venlafaxine XR (EFFEXOR-XR) 150 MG 24 hr capsule Take 1 capsule (150 mg total) by mouth daily with breakfast. (Patient not taking: Reported on 09/06/2020) 90 capsule 3   No current facility-administered medications for this visit.    Allergies  Allergen Reactions  . Aspirin Swelling    Swelling of the face     I appreciate the opportunity to take part in [facility] care. Please do not hesitate to contact me with questions.  Sincerely,   R. Jorene Guest, MD

## 2020-09-06 NOTE — Assessment & Plan Note (Signed)
   Continue appropriate allergen avoidance measures.  Montelukast has been prescribed (as above).  Continue fluticasone nasal spray, one spray per nostril 1-2 times daily as needed.   Nasal saline spray (i.e., Simply Saline) or nasal saline lavage (i.e., NeilMed) is recommended as needed and prior to medicated nasal sprays.

## 2020-09-07 ENCOUNTER — Other Ambulatory Visit: Payer: Self-pay

## 2020-09-07 MED ORDER — PROAIR RESPICLICK 108 (90 BASE) MCG/ACT IN AEPB: 1.0000 | INHALATION_SPRAY | RESPIRATORY_TRACT | 1 refills | Status: DC | PRN

## 2020-09-08 ENCOUNTER — Other Ambulatory Visit: Payer: Self-pay

## 2020-09-08 MED ORDER — PROAIR RESPICLICK 108 (90 BASE) MCG/ACT IN AEPB
1.0000 | INHALATION_SPRAY | RESPIRATORY_TRACT | 1 refills | Status: DC | PRN
Start: 2020-09-08 — End: 2021-08-11

## 2020-09-16 ENCOUNTER — Other Ambulatory Visit: Payer: Self-pay

## 2020-09-16 MED ORDER — ALBUTEROL SULFATE HFA 108 (90 BASE) MCG/ACT IN AERS: 2.0000 | INHALATION_SPRAY | RESPIRATORY_TRACT | 3 refills | Status: DC | PRN

## 2020-10-12 ENCOUNTER — Other Ambulatory Visit: Payer: Self-pay | Admitting: Family Medicine

## 2020-10-12 DIAGNOSIS — F32A Depression, unspecified: Secondary | ICD-10-CM

## 2020-10-12 DIAGNOSIS — F419 Anxiety disorder, unspecified: Secondary | ICD-10-CM

## 2020-10-30 NOTE — Patient Instructions (Signed)
It was great to see you again today, I will be in touch with your labs as soon as possible   Health Maintenance, Female Adopting a healthy lifestyle and getting preventive care are important in promoting health and wellness. Ask your health care provider about:  The right schedule for you to have regular tests and exams.  Things you can do on your own to prevent diseases and keep yourself healthy. What should I know about diet, weight, and exercise? Eat a healthy diet  Eat a diet that includes plenty of vegetables, fruits, low-fat dairy products, and lean protein.  Do not eat a lot of foods that are high in solid fats, added sugars, or sodium.   Maintain a healthy weight Body mass index (BMI) is used to identify weight problems. It estimates body fat based on height and weight. Your health care provider can help determine your BMI and help you achieve or maintain a healthy weight. Get regular exercise Get regular exercise. This is one of the most important things you can do for your health. Most adults should:  Exercise for at least 150 minutes each week. The exercise should increase your heart rate and make you sweat (moderate-intensity exercise).  Do strengthening exercises at least twice a week. This is in addition to the moderate-intensity exercise.  Spend less time sitting. Even light physical activity can be beneficial. Watch cholesterol and blood lipids Have your blood tested for lipids and cholesterol at 58 years of age, then have this test every 5 years. Have your cholesterol levels checked more often if:  Your lipid or cholesterol levels are high.  You are older than 58 years of age.  You are at high risk for heart disease. What should I know about cancer screening? Depending on your health history and family history, you may need to have cancer screening at various ages. This may include screening for:  Breast cancer.  Cervical cancer.  Colorectal cancer.  Skin  cancer.  Lung cancer. What should I know about heart disease, diabetes, and high blood pressure? Blood pressure and heart disease  High blood pressure causes heart disease and increases the risk of stroke. This is more likely to develop in people who have high blood pressure readings, are of African descent, or are overweight.  Have your blood pressure checked: ? Every 3-5 years if you are 18-39 years of age. ? Every year if you are 40 years old or older. Diabetes Have regular diabetes screenings. This checks your fasting blood sugar level. Have the screening done:  Once every three years after age 40 if you are at a normal weight and have a low risk for diabetes.  More often and at a younger age if you are overweight or have a high risk for diabetes. What should I know about preventing infection? Hepatitis B If you have a higher risk for hepatitis B, you should be screened for this virus. Talk with your health care provider to find out if you are at risk for hepatitis B infection. Hepatitis C Testing is recommended for:  Everyone born from 1945 through 1965.  Anyone with known risk factors for hepatitis C. Sexually transmitted infections (STIs)  Get screened for STIs, including gonorrhea and chlamydia, if: ? You are sexually active and are younger than 58 years of age. ? You are older than 58 years of age and your health care provider tells you that you are at risk for this type of infection. ? Your sexual activity   has changed since you were last screened, and you are at increased risk for chlamydia or gonorrhea. Ask your health care provider if you are at risk.  Ask your health care provider about whether you are at high risk for HIV. Your health care provider may recommend a prescription medicine to help prevent HIV infection. If you choose to take medicine to prevent HIV, you should first get tested for HIV. You should then be tested every 3 months for as long as you are taking  the medicine. Pregnancy  If you are about to stop having your period (premenopausal) and you may become pregnant, seek counseling before you get pregnant.  Take 400 to 800 micrograms (mcg) of folic acid every day if you become pregnant.  Ask for birth control (contraception) if you want to prevent pregnancy. Osteoporosis and menopause Osteoporosis is a disease in which the bones lose minerals and strength with aging. This can result in bone fractures. If you are 65 years old or older, or if you are at risk for osteoporosis and fractures, ask your health care provider if you should:  Be screened for bone loss.  Take a calcium or vitamin D supplement to lower your risk of fractures.  Be given hormone replacement therapy (HRT) to treat symptoms of menopause. Follow these instructions at home: Lifestyle  Do not use any products that contain nicotine or tobacco, such as cigarettes, e-cigarettes, and chewing tobacco. If you need help quitting, ask your health care provider.  Do not use street drugs.  Do not share needles.  Ask your health care provider for help if you need support or information about quitting drugs. Alcohol use  Do not drink alcohol if: ? Your health care provider tells you not to drink. ? You are pregnant, may be pregnant, or are planning to become pregnant.  If you drink alcohol: ? Limit how much you use to 0-1 drink a day. ? Limit intake if you are breastfeeding.  Be aware of how much alcohol is in your drink. In the [place] one drink equals one 12 oz bottle of beer (355 mL), one 5 oz glass of wine (148 mL), or one 1 oz glass of hard liquor (44 mL). General instructions  Schedule regular health, dental, and eye exams.  Stay current with your vaccines.  Tell your health care provider if: ? You often feel depressed. ? You have ever been abused or do not feel safe at home. Summary  Adopting a healthy lifestyle and getting preventive care are important in  promoting health and wellness.  Follow your health care provider's instructions about healthy diet, exercising, and getting tested or screened for diseases.  Follow your health care provider's instructions on monitoring your cholesterol and blood pressure. This information is not intended to replace advice given to you by your health care provider. Make sure you discuss any questions you have with your health care provider. Document Revised: 09/04/2018 Document Reviewed: 09/04/2018 Elsevier Patient Education  2021 Elsevier Inc.  

## 2020-10-30 NOTE — Progress Notes (Deleted)
[facility] [address] [place], [place] [address] [phone]  Date:  11/01/2020   Name:  Sierra Lyons   DOB:  1961/03/09   MRN:  [id number]  PCP:  [name], MD    Chief Complaint: No chief complaint on file.   History of Present Illness:  Sierra Lyons is a 60 y.o. very pleasant female patient who presents with the following:  Patient here today for physical exam. History of hyperlipidemia, asthma and allergies, LADA type diabetes Last seen by myself in August  Her diabetes is managed by endocrinology, most recent visit with Dr. Lonzo Cloud in October At that time they continued her Humalog and start Trulicity  She also sees allergist, Dr. Nunzio Cobbs for her asthma and allergic rhinitis-most recent visit in December  Eye exam Foot exam Flu vaccine COVID booster Update urine microalbumin Can give second dose of Shingrix Full labs done in August, A1c in [place] update A1c today  Trulicity Humalog 75/25 Lovastatin 20 Effexor Symbicort Breztri combined inhaler  Patient Active Problem List   Diagnosis Date Noted  . Type 1 diabetes mellitus with hyperglycemia (HCC) 07/20/2020  . Latent autoimmune diabetes in adults (LADA), managed as type 1 (HCC) 02/06/2020  . Dyslipidemia 02/06/2020  . Overweight 01/05/2016  . Moderate persistent asthma 09/07/2015  . Allergic rhinitis 08/10/2015    Past Medical History:  Diagnosis Date  . Asthma   . Chronic rhinitis around 1986  . Diabetes mellitus without complication [facility])     Past Surgical History:  Procedure Laterality Date  . CESAREAN SECTION  1993, 1997, 1999  . VEIN SURGERY  2012    Social History   Tobacco Use  . Smoking status: Never Smoker  . Smokeless tobacco: Never Used  Vaping Use  . Vaping Use: Never used  Substance Use Topics  . Alcohol use: No  . Drug use: No    Family History  Problem Relation Age of Onset  .  Diabetes Mother   . Hypertension Mother   . Allergic rhinitis Father   . Allergic rhinitis Sister   . Asthma Sister   . Urticaria Sister   . Diabetes Maternal Grandmother   . Hypertension Maternal Grandmother     Allergies  Allergen Reactions  . Aspirin Swelling    Swelling of the face     Medication list has been reviewed and updated.  Current Outpatient Medications on File Prior to Visit  Medication Sig Dispense Refill  . albuterol (PROAIR HFA) 108 (90 Base) MCG/ACT inhaler Inhale 2 puffs into the lungs every 4 (four) hours as needed for wheezing or shortness of breath. 1 each 3  . Albuterol Sulfate (PROAIR RESPICLICK) 108 (90 Base) MCG/ACT AEPB Inhale 1-2 puffs into the lungs every 4 (four) hours as needed (for cough or wheeze). 1 each 1  . Budeson-Glycopyrrol-Formoterol (BREZTRI AEROSPHERE) 160-9-4.8 MCG/ACT AERO Inhale 2 puffs into the lungs 2 (two) times daily. Use with spacer. Rinse, gargle and spit out after use. 10.7 g 5  . Dulaglutide (TRULICITY) 0.75 MG/0.5ML SOPN Inject 0.75 mg into the skin once a week. 2 mL 6  . fluticasone (FLONASE) 50 MCG/ACT nasal spray Place 1 spray into both nostrils daily as needed. 16 g 5  . ibuprofen (ADVIL,MOTRIN) 200 MG tablet Take 200 mg by mouth every 6 (six) hours as needed.    . Insulin Lispro Prot & Lispro (HUMALOG MIX 75/25 KWIKPEN) (75-25) 100 UNIT/ML Kwikpen Inject 10  Units into the skin 2 (two) times daily with a meal. 15 mL 11  . Insulin Pen Needle 32G X 4 MM MISC 1 Device by Does not apply route 2 (two) times daily with a meal. 100 each 6  . lovastatin (MEVACOR) 20 MG tablet Take 1 tablet (20 mg total) by mouth at bedtime. (Patient not taking: Reported on 09/06/2020) 90 tablet 3  . montelukast (SINGULAIR) 10 MG tablet Take 1 tablet (10 mg total) by mouth at bedtime. 90 tablet 1  . Multiple Vitamins-Minerals (CENTRUM ADULTS) TABS Take 1 tablet by mouth daily. (Patient not taking: Reported on 09/06/2020)    . SYMBICORT 160-4.5 MCG/ACT  inhaler INHALE 2 PUFFS INTO THE LUNGS TWICE DAILY (Patient not taking: Reported on 09/06/2020) 10.2 g 0  . venlafaxine XR (EFFEXOR-XR) 150 MG 24 hr capsule TAKE 1 CAPSULE(150 MG) BY MOUTH DAILY WITH BREAKFAST 90 capsule 3   No current facility-administered medications on file prior to visit.    Review of Systems:  As per HPI- otherwise negative.   Physical Examination: There were no vitals filed for this visit. There were no vitals filed for this visit. There is no height or weight on file to calculate BMI. Ideal Body Weight:    GEN: no acute distress. HEENT: Atraumatic, Normocephalic.  Ears and Nose: No external deformity. CV: RRR, No M/G/R. No JVD. No thrill. No extra heart sounds. PULM: CTA B, no wheezes, crackles, rhonchi. No retractions. No resp. distress. No accessory muscle use. ABD: S, NT, ND, +BS. No rebound. No HSM. EXTR: No c/c/e PSYCH: Normally interactive. Conversant.    Assessment and Plan: *** This visit occurred during the SARS-CoV-2 public health emergency.  Safety protocols were in place, including screening questions prior to the visit, additional usage of staff PPE, and extensive cleaning of exam room while observing appropriate contact time as indicated for disinfecting solutions.    Signed Abbe Amsterdam, MD

## 2020-11-01 ENCOUNTER — Ambulatory Visit (INDEPENDENT_AMBULATORY_CARE_PROVIDER_SITE_OTHER): Payer: 59 | Admitting: Family Medicine

## 2020-11-01 ENCOUNTER — Telehealth: Payer: Self-pay

## 2020-11-01 DIAGNOSIS — Z538 Procedure and treatment not carried out for other reasons: Secondary | ICD-10-CM

## 2020-11-01 NOTE — Telephone Encounter (Signed)
Caller states she wants to cancel [phone] appt, declined triage, requests callback.  Telephone: [phone]

## 2020-11-02 NOTE — Progress Notes (Addendum)
[facility] [address] [place], [place] [address] [phone]  Date:  11/04/2020   Name:  Sierra Lyons   DOB:  06/13/1961   MRN:  [id number]  PCP:  [name], MD    Chief Complaint: Annual Exam   History of Present Illness:  Sierra Lyons is a 60 y.o. very pleasant female patient who presents with the following:  Pt here today for a CPE- History of hyperlipidemia, asthma and allergies, LADA type diabetes Last seen by myself in August Her DM is managed by [facility], asthma by Dr Nunzio Cobbs  Eye exam- this was done December  Foot exam- do today  Flu vaccine done  covid booster done   Urine micro now due Most recent labs done in August  Can give 2nd dose shingrix - give today   She notes that she has gained some weight since she went on insulin- she also took a new job and is not as active.  She admits to not getting a lot of exercise right now   Wt Readings from Last 3 Encounters:  11/04/20 177 lb (80.3 kg)  09/06/20 173 lb 1 oz (78.5 kg)  07/19/20 168 lb (76.2 kg)    Lab Results  Component Value Date   HGBA1C 9.1 (A) 07/19/2020   Lovastatin effexor trulicity humalog 75/25    Patient Active Problem List   Diagnosis Date Noted   Type 1 diabetes mellitus with hyperglycemia (HCC) 07/20/2020   Latent autoimmune diabetes in adults (LADA), managed as type 1 (HCC) 02/06/2020   Dyslipidemia 02/06/2020   Overweight 01/05/2016   Moderate persistent asthma 09/07/2015   Allergic rhinitis 08/10/2015    Past Medical History:  Diagnosis Date   Asthma    Chronic rhinitis around 1986   Diabetes mellitus without complication (HCC)     Past Surgical History:  Procedure Laterality Date   CESAREAN SECTION  1993, 1997, 1999   VEIN SURGERY  2012    Social History   Tobacco Use   Smoking status: Never Smoker   Smokeless tobacco: Never Used  Vaping Use   Vaping Use: Never  used  Substance Use Topics   Alcohol use: No   Drug use: No    Family History  Problem Relation Age of Onset   Diabetes Mother    Hypertension Mother    Allergic rhinitis Father    Allergic rhinitis Sister    Asthma Sister    Urticaria Sister    Diabetes Maternal Grandmother    Hypertension Maternal Grandmother     Allergies  Allergen Reactions   Aspirin Swelling    Swelling of the face     Medication list has been reviewed and updated.  Current Outpatient Medications on File Prior to Visit  Medication Sig Dispense Refill   albuterol (PROAIR HFA) 108 (90 Base) MCG/ACT inhaler Inhale 2 puffs into the lungs every 4 (four) hours as needed for wheezing or shortness of breath. 1 each 3   Albuterol Sulfate (PROAIR RESPICLICK) 108 (90 Base) MCG/ACT AEPB Inhale 1-2 puffs into the lungs every 4 (four) hours as needed (for cough or wheeze). 1 each 1   Budeson-Glycopyrrol-Formoterol (BREZTRI AEROSPHERE) 160-9-4.8 MCG/ACT AERO Inhale 2 puffs into the lungs 2 (two) times daily. Use with spacer. Rinse, gargle and spit out after use. 10.7 g 5   Dulaglutide (TRULICITY) 0.75 MG/0.5ML SOPN Inject 0.75 mg into the skin once a week. 2 mL 6  fluticasone (FLONASE) 50 MCG/ACT nasal spray Place 1 spray into both nostrils daily as needed. 16 g 5   ibuprofen (ADVIL,MOTRIN) 200 MG tablet Take 200 mg by mouth every 6 (six) hours as needed.     Insulin Lispro Prot & Lispro (HUMALOG MIX 75/25 KWIKPEN) (75-25) 100 UNIT/ML Kwikpen Inject 10 Units into the skin 2 (two) times daily with a meal. 15 mL 11   Insulin Pen Needle 32G X 4 MM MISC 1 Device by Does not apply route 2 (two) times daily with a meal. 100 each 6   montelukast (SINGULAIR) 10 MG tablet Take 1 tablet (10 mg total) by mouth at bedtime. 90 tablet 1   Multiple Vitamins-Minerals (CENTRUM ADULTS) TABS Take 1 tablet by mouth daily.     SYMBICORT 160-4.5 MCG/ACT inhaler INHALE 2 PUFFS INTO THE LUNGS TWICE DAILY 10.2 g 0    venlafaxine XR (EFFEXOR-XR) 150 MG 24 hr capsule TAKE 1 CAPSULE(150 MG) BY MOUTH DAILY WITH BREAKFAST 90 capsule 3   No current facility-administered medications on file prior to visit.    Review of Systems:  As per HPI- otherwise negative.   Physical Examination: Vitals:   11/04/20 1036  BP: 124/76  Pulse: 76  Resp: 16  SpO2: 97%   Vitals:   11/04/20 1036  Weight: 177 lb (80.3 kg)  Height: 5\' 4"  (1.626 m)   Body mass index is 30.38 kg/m. Ideal Body Weight: Weight in (lb) to have BMI = 25: 145.3  GEN: no acute distress.  Overweight, looks well  HEENT: Atraumatic, Normocephalic.   Bilateral TM wnl,PEERL,EOMI.   Ears and Nose: No external deformity. CV: RRR, No M/G/R. No JVD. No thrill. No extra heart sounds. PULM: CTA B, no wheezes, crackles, rhonchi. No retractions. No resp. distress. No accessory muscle use. ABD: S, NT, ND, +BS. No rebound. No HSM. EXTR: No c/c/e PSYCH: Normally interactive. Conversant.  Foot exam normal   Assessment and Plan: Physical exam  Controlled type 2 diabetes mellitus with diabetic neuropathy, without long-term current use of insulin (HCC) - Plan: Hemoglobin A1c, Microalbumin / creatinine urine ratio, glucose blood (CONTOUR NEXT TEST) test strip, lovastatin (MEVACOR) 20 MG tablet  Dyslipidemia - Plan: lovastatin (MEVACOR) 20 MG tablet  Needs flu shot - Plan: Varicella-zoster vaccine IM (Shingrix)  CPE today- overall doing well but is concerned about weight gain.  We discussed exercise and she plans to work on this 2nd dose shingrix today Discussed health maint  This visit occurred during the SARS-CoV-2 public health emergency.  Safety protocols were in place, including screening questions prior to the visit, additional usage of staff PPE, and extensive cleaning of exam room while observing appropriate contact time as indicated for disinfecting solutions.    Signed , MD  Received her labs as below- message to  pt  Results for orders placed or performed in visit on 11/04/20  Hemoglobin A1c  Result Value Ref Range   Hgb A1c MFr Bld 7.8 (H) 4.6 - 6.5 %  Microalbumin / creatinine urine ratio  Result Value Ref Range   Microalb, Ur <0.7 0.0 - 1.9 mg/dL   Creatinine,U 01/02/21 mg/dL   Microalb Creat Ratio 3.6 0.0 - 30.0 mg/g

## 2020-11-02 NOTE — Patient Instructions (Addendum)
It was great to see you again today, I will be in touch with your labs asap  You got your 2nd dose of shingrix today   Health Maintenance, Female Adopting a healthy lifestyle and getting preventive care are important in promoting health and wellness. Ask your health care provider about:  The right schedule for you to have regular tests and exams.  Things you can do on your own to prevent diseases and keep yourself healthy. What should I know about diet, weight, and exercise? Eat a healthy diet  Eat a diet that includes plenty of vegetables, fruits, low-fat dairy products, and lean protein.  Do not eat a lot of foods that are high in solid fats, added sugars, or sodium.   Maintain a healthy weight Body mass index (BMI) is used to identify weight problems. It estimates body fat based on height and weight. Your health care provider can help determine your BMI and help you achieve or maintain a healthy weight. Get regular exercise Get regular exercise. This is one of the most important things you can do for your health. Most adults should:  Exercise for at least 150 minutes each week. The exercise should increase your heart rate and make you sweat (moderate-intensity exercise).  Do strengthening exercises at least twice a week. This is in addition to the moderate-intensity exercise.  Spend less time sitting. Even light physical activity can be beneficial. Watch cholesterol and blood lipids Have your blood tested for lipids and cholesterol at 58 years of age, then have this test every 5 years. Have your cholesterol levels checked more often if:  Your lipid or cholesterol levels are high.  You are older than 58 years of age.  You are at high risk for heart disease. What should I know about cancer screening? Depending on your health history and family history, you may need to have cancer screening at various ages. This may include screening for:  Breast cancer.  Cervical  cancer.  Colorectal cancer.  Skin cancer.  Lung cancer. What should I know about heart disease, diabetes, and high blood pressure? Blood pressure and heart disease  High blood pressure causes heart disease and increases the risk of stroke. This is more likely to develop in people who have high blood pressure readings, are of African descent, or are overweight.  Have your blood pressure checked: ? Every 3-5 years if you are 50-75 years of age. ? Every year if you are 52 years old or older. Diabetes Have regular diabetes screenings. This checks your fasting blood sugar level. Have the screening done:  Once every three years after age 10 if you are at a normal weight and have a low risk for diabetes.  More often and at a younger age if you are overweight or have a high risk for diabetes. What should I know about preventing infection? Hepatitis B If you have a higher risk for hepatitis B, you should be screened for this virus. Talk with your health care provider to find out if you are at risk for hepatitis B infection. Hepatitis C Testing is recommended for:  Everyone born from [birth year] through 1965.  Anyone with known risk factors for hepatitis C. Sexually transmitted infections (STIs)  Get screened for STIs, including gonorrhea and chlamydia, if: ? You are sexually active and are younger than 58 years of age. ? You are older than 58 years of age and your health care provider tells you that you are at risk for this type  of infection. ? Your sexual activity has changed since you were last screened, and you are at increased risk for chlamydia or gonorrhea. Ask your health care provider if you are at risk.  Ask your health care provider about whether you are at high risk for HIV. Your health care provider may recommend a prescription medicine to help prevent HIV infection. If you choose to take medicine to prevent HIV, you should first get tested for HIV. You should then be tested every 3  months for as long as you are taking the medicine. Pregnancy  If you are about to stop having your period (premenopausal) and you may become pregnant, seek counseling before you get pregnant.  Take 400 to 800 micrograms (mcg) of folic acid every day if you become pregnant.  Ask for birth control (contraception) if you want to prevent pregnancy. Osteoporosis and menopause Osteoporosis is a disease in which the bones lose minerals and strength with aging. This can result in bone fractures. If you are 86 years old or older, or if you are at risk for osteoporosis and fractures, ask your health care provider if you should:  Be screened for bone loss.  Take a calcium or vitamin D supplement to lower your risk of fractures.  Be given hormone replacement therapy (HRT) to treat symptoms of menopause. Follow these instructions at home: Lifestyle  Do not use any products that contain nicotine or tobacco, such as cigarettes, e-cigarettes, and chewing tobacco. If you need help quitting, ask your health care provider.  Do not use street drugs.  Do not share needles.  Ask your health care provider for help if you need support or information about quitting drugs. Alcohol use  Do not drink alcohol if: ? Your health care provider tells you not to drink. ? You are pregnant, may be pregnant, or are planning to become pregnant.  If you drink alcohol: ? Limit how much you use to 0-1 drink a day. ? Limit intake if you are breastfeeding.  Be aware of how much alcohol is in your drink. In the [place] one drink equals one 12 oz bottle of beer (355 mL), one 5 oz glass of wine (148 mL), or one 1 oz glass of hard liquor (44 mL). General instructions  Schedule regular health, dental, and eye exams.  Stay current with your vaccines.  Tell your health care provider if: ? You often feel depressed. ? You have ever been abused or do not feel safe at home. Summary  Adopting a healthy lifestyle and getting  preventive care are important in promoting health and wellness.  Follow your health care provider's instructions about healthy diet, exercising, and getting tested or screened for diseases.  Follow your health care provider's instructions on monitoring your cholesterol and blood pressure. This information is not intended to replace advice given to you by your health care provider. Make sure you discuss any questions you have with your health care provider. Document Revised: 09/04/2018 Document Reviewed: 09/04/2018 Elsevier Patient Education  2021 [employer].

## 2020-11-02 NOTE — Telephone Encounter (Signed)
Appt rescheduled 11/04/20.

## 2020-11-04 ENCOUNTER — Encounter: Payer: Self-pay | Admitting: Family Medicine

## 2020-11-04 ENCOUNTER — Ambulatory Visit (INDEPENDENT_AMBULATORY_CARE_PROVIDER_SITE_OTHER): Payer: 59 | Admitting: Family Medicine

## 2020-11-04 ENCOUNTER — Other Ambulatory Visit: Payer: Self-pay

## 2020-11-04 VITALS — BP 124/76 | HR 76 | Resp 16 | Ht 64.0 in | Wt 177.0 lb

## 2020-11-04 DIAGNOSIS — Z0000 Encounter for general adult medical examination without abnormal findings: Secondary | ICD-10-CM

## 2020-11-04 DIAGNOSIS — E1140 Type 2 diabetes mellitus with diabetic neuropathy, unspecified: Secondary | ICD-10-CM | POA: Diagnosis not present

## 2020-11-04 DIAGNOSIS — E785 Hyperlipidemia, unspecified: Secondary | ICD-10-CM | POA: Diagnosis not present

## 2020-11-04 DIAGNOSIS — Z23 Encounter for immunization: Secondary | ICD-10-CM | POA: Diagnosis not present

## 2020-11-04 LAB — MICROALBUMIN / CREATININE URINE RATIO
Creatinine,U: 19.5 mg/dL
Microalb Creat Ratio: 3.6 mg/g (ref 0.0–30.0)
Microalb, Ur: <0.7 mg/dL (ref 0.0–1.9)

## 2020-11-04 LAB — HEMOGLOBIN A1C: Hgb A1c MFr Bld: 7.8 % — ABNORMAL HIGH (ref 4.6–6.5)

## 2020-11-04 MED ORDER — CONTOUR NEXT TEST VI STRP: ORAL_STRIP | 12 refills | Status: DC

## 2020-11-04 MED ORDER — LOVASTATIN 20 MG PO TABS: 20.0000 mg | ORAL_TABLET | Freq: Every day | ORAL | 3 refills | Status: DC

## 2020-11-18 ENCOUNTER — Other Ambulatory Visit: Payer: Self-pay

## 2020-11-22 ENCOUNTER — Encounter: Payer: Self-pay | Admitting: Internal Medicine

## 2020-11-22 ENCOUNTER — Other Ambulatory Visit: Payer: Self-pay

## 2020-11-22 ENCOUNTER — Ambulatory Visit: Payer: 59 | Admitting: Internal Medicine

## 2020-11-22 VITALS — BP 116/72 | HR 78 | Ht 64.0 in | Wt 182.2 lb

## 2020-11-22 DIAGNOSIS — E1065 Type 1 diabetes mellitus with hyperglycemia: Secondary | ICD-10-CM

## 2020-11-22 DIAGNOSIS — E139 Other specified diabetes mellitus without complications: Secondary | ICD-10-CM | POA: Diagnosis not present

## 2020-11-22 DIAGNOSIS — E1140 Type 2 diabetes mellitus with diabetic neuropathy, unspecified: Secondary | ICD-10-CM

## 2020-11-22 MED ORDER — CONTOUR NEXT TEST VI STRP: 1.0000 | ORAL_STRIP | Freq: Two times a day (BID) | 12 refills | Status: DC

## 2020-11-22 MED ORDER — TRULICITY 1.5 MG/0.5ML ~~LOC~~ SOAJ: 1.5000 mg | SUBCUTANEOUS | 3 refills | Status: DC

## 2020-11-22 NOTE — Patient Instructions (Addendum)
-   keep up the Good Work ! - Humalog Mix 8 units with Breakfast and 8 units with Supper  - Increase Trulicity to 1.5mg  weekly        HOW TO TREAT LOW BLOOD SUGARS (Blood sugar LESS THAN 70 MG/DL)  Please follow the RULE OF 15 for the treatment of hypoglycemia treatment (when your (blood sugars are less than 70 mg/dL)    STEP 1: Take 15 grams of carbohydrates when your blood sugar is low, which includes:   3-4 GLUCOSE TABS  OR  3-4 OZ OF JUICE OR REGULAR SODA OR  ONE TUBE OF GLUCOSE GEL     STEP 2: RECHECK blood sugar in 15 MINUTES STEP 3: If your blood sugar is still low at the 15 minute recheck --> then, go back to STEP 1 and treat AGAIN with another 15 grams of carbohydrates.

## 2020-11-22 NOTE — Progress Notes (Signed)
Name: Sierra Lyons  Age/ Sex: 60 y.o., female   MRN/ DOB: [id number], [birth year]     PCP: [name], MD   Reason for Endocrinology Evaluation: Latent Autoimmune Diabetes Mellitus  Initial Endocrine Consultative Visit: 02/06/2020    PATIENT IDENTIFIER: Sierra Lyons is a 60 y.o. female with a past medical history of Asthma and DM. The patient has followed with Endocrinology clinic since 02/06/2020 for consultative assistance with management of her diabetes.  DIABETIC HISTORY:  Sierra Lyons was diagnosed with T2DM in 2017 at age 16 but by 11/2019 her diagnosis was changed to LADA based on elevated GAD-65 levels. She was initially on metformin and Invokana but this was changed to insulin, metformin stopped due to severe nausea. Her hemoglobin A1c has ranged from 6.2% in 2017, peaking at 13.0% in 2017.  trulcity started 06/2020 SUBJECTIVE:   During the last visit (07/19/2020): A1c 9.1 %.Continued  Humalog mix and started Trulicity   Today (11/22/2020): Sierra Lyons is here for a follow up on diabetes management.   She checks her sugars occasionally . The patient has not has any  hypoglycemic episodes since the last clinic visit.   Denies nausea or diarrhea     HOME DIABETES REGIMEN:  Humalog Mix 10 Units QAM and 8 units with Supper  Trulicity  0.75 mg weekly    Statin: yes ACE-I/ARB: no    METER DOWNLOAD SUMMARY: 1/30-2/278/2022 Average Number Tests/Day = 0.4 Overall Mean FS Glucose = 167 Standard Deviation = 50  BG Ranges: Low = 96 High = 281    Hypoglycemic Events/30 Days: BG < 50 = 0 Episodes of symptomatic severe hypoglycemia = 0    DIABETIC COMPLICATIONS: Microvascular complications:    Denies: CKD, retinopathy   Last eye exam: Completed 10/2018  Macrovascular complications:    Denies: CAD, PVD, CVA   HISTORY:  Past Medical History:  Past Medical History:  Diagnosis Date  . Asthma   . Chronic rhinitis around 1986  . Diabetes  mellitus without complication [facility])    Past Surgical History:  Past Surgical History:  Procedure Laterality Date  . CESAREAN SECTION  1993, 1997, 1999  . VEIN SURGERY  2012    Social History:  reports that she has never smoked. She has never used smokeless tobacco. She reports that she does not drink alcohol and does not use drugs. Family History:  Family History  Problem Relation Age of Onset  . Diabetes Mother   . Hypertension Mother   . Allergic rhinitis Father   . Allergic rhinitis Sister   . Asthma Sister   . Urticaria Sister   . Diabetes Maternal Grandmother   . Hypertension Maternal Grandmother      HOME MEDICATIONS: Allergies as of 11/22/2020      Reactions   Aspirin Swelling   Swelling of the face       Medication List       Accurate as of November 22, 2020  8:01 AM. If you have any questions, ask your nurse or doctor.        STOP taking these medications   Trulicity 0.75 MG/0.5ML Sopn Generic drug: Dulaglutide Replaced by: Trulicity 1.5 MG/0.5ML Sopn Stopped by: Scarlette Shorts, MD     TAKE these medications   Breztri Aerosphere 160-9-4.8 MCG/ACT Aero Generic drug: Budeson-Glycopyrrol-Formoterol Inhale 2 puffs into the lungs 2 (two) times daily. Use with spacer. Rinse, gargle and spit out after use.   Centrum Adults Tabs Take 1 tablet by mouth daily.  Contour Next Test test strip Generic drug: glucose blood 1 each by Other route 2 (two) times daily. Use as instructed What changed:   how much to take  how to take this  when to take this Changed by: Scarlette Shorts, MD   fluticasone 50 MCG/ACT nasal spray Commonly known as: FLONASE Place 1 spray into both nostrils daily as needed.   ibuprofen 200 MG tablet Commonly known as: ADVIL Take 200 mg by mouth every 6 (six) hours as needed.   Insulin Lispro Prot & Lispro (75-25) 100 UNIT/ML Kwikpen Commonly known as: HumaLOG Mix 75/25 KwikPen Inject 10 Units into the skin 2 (two)  times daily with a meal.   Insulin Pen Needle 32G X 4 MM Misc 1 Device by Does not apply route 2 (two) times daily with a meal.   lovastatin 20 MG tablet Commonly known as: MEVACOR Take 1 tablet (20 mg total) by mouth at bedtime.   montelukast 10 MG tablet Commonly known as: SINGULAIR Take 1 tablet (10 mg total) by mouth at bedtime.   ProAir RespiClick 108 (90 Base) MCG/ACT Aepb Generic drug: Albuterol Sulfate Inhale 1-2 puffs into the lungs every 4 (four) hours as needed (for cough or wheeze).   albuterol 108 (90 Base) MCG/ACT inhaler Commonly known as: ProAir HFA Inhale 2 puffs into the lungs every 4 (four) hours as needed for wheezing or shortness of breath.   Symbicort 160-4.5 MCG/ACT inhaler Generic drug: budesonide-formoterol INHALE 2 PUFFS INTO THE LUNGS TWICE DAILY   Trulicity 1.5 MG/0.5ML Sopn Generic drug: Dulaglutide Inject 1.5 mg into the skin once a week. Replaces: Trulicity 0.75 MG/0.5ML Sopn Started by: Scarlette Shorts, MD   venlafaxine XR 150 MG 24 hr capsule Commonly known as: EFFEXOR-XR TAKE 1 CAPSULE(150 MG) BY MOUTH DAILY WITH BREAKFAST        OBJECTIVE:   Vital Signs: BP 116/72   Pulse 78   Ht 5\' 4"  (1.626 m)   Wt 182 lb 4 oz (82.7 kg)   SpO2 98%   BMI 31.28 kg/m   Wt Readings from Last 3 Encounters:  11/22/20 182 lb 4 oz (82.7 kg)  11/04/20 177 lb (80.3 kg)  09/06/20 173 lb 1 oz (78.5 kg)     Exam: General: Pt appears well and is in NAD  Lungs: Clear with good BS bilat with no rales, rhonchi, or wheezes  Heart: RRR  Abdomen: Normoactive bowel sounds, soft, nontender, without masses or organomegaly palpable  Extremities: No pretibial edema.   Neuro: MS is good with appropriate affect, pt is alert and Ox3    DM foot exam:  04/16/2020  The skin of the feet is intact without sores or ulcerations. The pedal pulses are 2+ on right and 2+ on left. The sensation is intact to a screening 5.07, 10 gram monofilament bilaterally         DATA REVIEWED:  Lab Results  Component Value Date   HGBA1C 7.8 (H) 11/04/2020   HGBA1C 9.1 (A) 07/19/2020   HGBA1C 9.2 (A) 04/16/2020   Lab Results  Component Value Date   MICROALBUR <0.7 11/04/2020   LDLCALC 114 (H) 05/19/2020   CREATININE 0.89 05/19/2020   Lab Results  Component Value Date   MICRALBCREAT 3.6 11/04/2020     Lab Results  Component Value Date   CHOL 217 (H) 05/19/2020   HDL 81 05/19/2020   LDLCALC 114 (H) 05/19/2020   LDLDIRECT 120.0 08/06/2017   TRIG 117 05/19/2020   CHOLHDL 2.7 05/19/2020  Results for Sierra Lyons, Sierra Lyons (MRN [id number]) as of 02/06/2020 13:08  Ref. Range 12/19/2019 13:32  Glutamic Acid Decarb Ab Latest Ref Range: <5 IU/mL >250 (H)    ASSESSMENT / PLAN / RECOMMENDATIONS:   1) Latent Autoimmune  Diabetes Mellitus (LADA), Sub-Optimally controlled  , Without complications - Most recent A1c of 7.8 %. Goal A1c < 7.0 %.    - A1c down from 9.2 % , praised pt on improved glycemic control  - Tolerating trilicity without side effects, will increase as below  - Will adjust Insulin accordingly  - Intolerant to Metformin due to severe nausea     MEDICATIONS: - Decrease Humalog Mix to 8 units with Breakfast and continue 8 units with Supper  - Increase Trulicity 1.5 mg weekly    EDUCATION / INSTRUCTIONS:  BG monitoring instructions: Patient is instructed to check her blood sugars 2 times a day, before breakfast and supper.  Call [place] [facility] if: BG persistently < 70  . I reviewed the Rule of 15 for the treatment of hypoglycemia in detail with the patient. Literature supplied.    2) Diabetic complications:   Eye: Does not have known diabetic retinopathy.   Neuro/ Feet: Does not have known diabetic peripheral neuropathy .   Renal: Patient does not have known baseline CKD. She   is not on an ACEI/ARB at present.      F/U in 4 months    Signed electronically by: Lyndle Herrlich, MD  [facility]  Endocrinology  [facility] [address] [place] [place], [place] [address] Phone: [phone] FAX: [phone]   CC: [name], MD [address] [place] [address] Phone: [phone]  Fax: [phone]  Return to Endocrinology clinic as below: Future Appointments  Date Time Provider Department Center  03/30/2021  7:30 AM Obinna Ehresman, Konrad Dolores, MD [facility] None

## 2021-02-11 ENCOUNTER — Other Ambulatory Visit: Payer: Self-pay | Admitting: Internal Medicine

## 2021-03-07 ENCOUNTER — Other Ambulatory Visit: Payer: Self-pay | Admitting: Internal Medicine

## 2021-03-07 MED ORDER — DEXCOM G6 SENSOR MISC: 1.0000 | 3 refills | Status: DC

## 2021-03-07 MED ORDER — DEXCOM G6 TRANSMITTER MISC: 1.0000 | 3 refills | Status: DC

## 2021-03-13 ENCOUNTER — Other Ambulatory Visit: Payer: Self-pay | Admitting: Internal Medicine

## 2021-03-30 ENCOUNTER — Other Ambulatory Visit: Payer: Self-pay

## 2021-03-30 ENCOUNTER — Encounter: Payer: Self-pay | Admitting: Internal Medicine

## 2021-03-30 ENCOUNTER — Ambulatory Visit: Payer: 59 | Admitting: Internal Medicine

## 2021-03-30 VITALS — BP 100/70 | HR 74 | Wt 181.6 lb

## 2021-03-30 DIAGNOSIS — E1065 Type 1 diabetes mellitus with hyperglycemia: Secondary | ICD-10-CM

## 2021-03-30 DIAGNOSIS — E785 Hyperlipidemia, unspecified: Secondary | ICD-10-CM | POA: Diagnosis not present

## 2021-03-30 LAB — LIPID PANEL
Cholesterol: 218 mg/dL — ABNORMAL HIGH (ref 0–200)
HDL: 77.6 mg/dL (ref >39.00)
LDL Cholesterol: 119 mg/dL — ABNORMAL HIGH (ref 0–99)
NonHDL: 140.21
Total CHOL/HDL Ratio: 3
Triglycerides: 106 mg/dL (ref 0.0–149.0)
VLDL: 21.2 mg/dL (ref 0.0–40.0)

## 2021-03-30 LAB — BASIC METABOLIC PANEL
BUN: 17 mg/dL (ref 6–23)
CO2: 29 mEq/L (ref 19–32)
Calcium: 9.3 mg/dL (ref 8.4–10.5)
Chloride: 100 mEq/L (ref 96–112)
Creatinine, Ser: 0.75 mg/dL (ref 0.40–1.20)
GFR: 88.12 mL/min (ref >60.00)
Glucose, Bld: 282 mg/dL — ABNORMAL HIGH (ref 70–99)
Potassium: 4.4 mEq/L (ref 3.5–5.1)
Sodium: 135 mEq/L (ref 135–145)

## 2021-03-30 LAB — HEMOGLOBIN A1C: Hgb A1c MFr Bld: 8.3 % — ABNORMAL HIGH (ref 4.6–6.5)

## 2021-03-30 MED ORDER — ATORVASTATIN CALCIUM 20 MG PO TABS: 20.0000 mg | ORAL_TABLET | Freq: Every day | ORAL | 3 refills | Status: DC

## 2021-03-30 NOTE — Patient Instructions (Addendum)
-   Humalog Mix 8 units with Breakfast and 8 units with Supper  - Continue Trulicity 1.5mg  weekly     Check out Omnipod pump or the T-Slim/Tandem pump   HOW TO TREAT LOW BLOOD SUGARS (Blood sugar LESS THAN 70 MG/DL) Please follow the RULE OF 15 for the treatment of hypoglycemia treatment (when your (blood sugars are less than 70 mg/dL)   STEP 1: Take 15 grams of carbohydrates when your blood sugar is low, which includes:  3-4 GLUCOSE TABS  OR 3-4 OZ OF JUICE OR REGULAR SODA OR ONE TUBE OF GLUCOSE GEL    STEP 2: RECHECK blood sugar in 15 MINUTES STEP 3: If your blood sugar is still low at the 15 minute recheck --> then, go back to STEP 1 and treat AGAIN with another 15 grams of carbohydrates.

## 2021-03-30 NOTE — Progress Notes (Signed)
Name: Sierra Lyons  Age/ Sex: 60 y.o., female   MRN/ DOB: [id number], 1961/05/03     PCP: [name], MD   Reason for Endocrinology Evaluation: Latent Autoimmune Diabetes Mellitus  Initial Endocrine Consultative Visit: 02/06/2020    PATIENT IDENTIFIER: Sierra Lyons is a 60 y.o. female with a past medical history of Asthma and DM. The patient has followed with Endocrinology clinic since 02/06/2020 for consultative assistance with management of her diabetes.  DIABETIC HISTORY:  Sierra Lyons was diagnosed with T2DM in 2017 at age 20 but by 11/2019 her diagnosis was changed to LADA based on elevated GAD-65 levels. She was initially on metformin and Invokana but this was changed to insulin, metformin stopped due to severe nausea. Her hemoglobin A1c has ranged from 6.2% in 2017, peaking at 13.0% in 2017.  trulcity started 06/2020 SUBJECTIVE:   During the last visit (11/22/2018): A1c 7.8 %. Decrease Humalog mix and increased Trulicity   Today (03/30/2021): Sierra Lyons is here for a follow up on diabetes management.   She checks her sugars occasionally . The patient had hypoglycemic episodes since the last clinic visit, but   Denies nausea or diarrhea     HOME DIABETES REGIMEN:  Humalog Mix 8 Units QAM and 8 units with Supper  Trulicity  1.5 mg weekly    Statin: yes ACE-I/ARB: no    METER DOWNLOAD SUMMARY:  14 day average 196   53-  344 mg/dL     DIABETIC COMPLICATIONS: Microvascular complications:    Denies: CKD, retinopathy  Last eye exam: Completed 10/2018   Macrovascular complications:    Denies: CAD, PVD, CVA   HISTORY:  Past Medical History:  Past Medical History:  Diagnosis Date  . Asthma   . Chronic rhinitis around 1986  . Diabetes mellitus without complication [facility])    Past Surgical History:  Past Surgical History:  Procedure Laterality Date  . CESAREAN SECTION  1993, 1997, 1999  . VEIN SURGERY  2012   Social History:  reports that she  has never smoked. She has never used smokeless tobacco. She reports that she does not drink alcohol and does not use drugs. Family History:  Family History  Problem Relation Age of Onset  . Diabetes Mother   . Hypertension Mother   . Allergic rhinitis Father   . Allergic rhinitis Sister   . Asthma Sister   . Urticaria Sister   . Diabetes Maternal Grandmother   . Hypertension Maternal Grandmother      HOME MEDICATIONS: Allergies as of 03/30/2021       Reactions   Aspirin Swelling   Swelling of the face         Medication List        Accurate as of March 30, 2021  1:44 PM. If you have any questions, ask your nurse or doctor.          BD Pen Needle Nano 2nd Gen 32G X 4 MM Misc Generic drug: Insulin Pen Needle USE TWICE DAILY   Breztri Aerosphere 160-9-4.8 MCG/ACT Aero Generic drug: Budeson-Glycopyrrol-Formoterol Inhale 2 puffs into the lungs 2 (two) times daily. Use with spacer. Rinse, gargle and spit out after use.   Centrum Adults Tabs Take 1 tablet by mouth daily.   Contour Next Test test strip Generic drug: glucose blood 1 each by Other route 2 (two) times daily. Use as instructed   Dexcom G6 Sensor Misc 1 Device by Does not apply route as directed.   Dexcom G6  Transmitter Misc 1 Device by Does not apply route as directed.   fluticasone 50 MCG/ACT nasal spray Commonly known as: FLONASE Place 1 spray into both nostrils daily as needed.   HumaLOG Mix 75/25 KwikPen (75-25) 100 UNIT/ML Kwikpen Generic drug: Insulin Lispro Prot & Lispro INJECT 10 UNITS INTO THE SKIN TWICE DAILY WITH A MEAL   ibuprofen 200 MG tablet Commonly known as: ADVIL Take 200 mg by mouth every 6 (six) hours as needed.   lovastatin 20 MG tablet Commonly known as: MEVACOR Take 1 tablet (20 mg total) by mouth at bedtime.   montelukast 10 MG tablet Commonly known as: SINGULAIR Take 1 tablet (10 mg total) by mouth at bedtime.   ProAir RespiClick 108 (90 Base) MCG/ACT Aepb Generic  drug: Albuterol Sulfate Inhale 1-2 puffs into the lungs every 4 (four) hours as needed (for cough or wheeze).   albuterol 108 (90 Base) MCG/ACT inhaler Commonly known as: ProAir HFA Inhale 2 puffs into the lungs every 4 (four) hours as needed for wheezing or shortness of breath.   Symbicort 160-4.5 MCG/ACT inhaler Generic drug: budesonide-formoterol INHALE 2 PUFFS INTO THE LUNGS TWICE DAILY   Trulicity 1.5 MG/0.5ML Sopn Generic drug: Dulaglutide Inject 1.5 mg into the skin once a week.   venlafaxine XR 150 MG 24 hr capsule Commonly known as: EFFEXOR-XR TAKE 1 CAPSULE(150 MG) BY MOUTH DAILY WITH BREAKFAST         OBJECTIVE:   Vital Signs: BP 100/70   Pulse 74   Wt 181 lb 9.6 oz (82.4 kg)   SpO2 99%   BMI 31.17 kg/m   Wt Readings from Last 3 Encounters:  03/30/21 181 lb 9.6 oz (82.4 kg)  11/22/20 182 lb 4 oz (82.7 kg)  11/04/20 177 lb (80.3 kg)     Exam: General: Pt appears well and is in NAD  Lungs: Clear with good BS bilat with no rales, rhonchi, or wheezes  Heart: RRR  Abdomen: Normoactive bowel sounds, soft, nontender, without masses or organomegaly palpable  Extremities: No pretibial edema.   Neuro: MS is good with appropriate affect, pt is alert and Ox3    DM foot exam:  03/30/2021  The skin of the feet is intact without sores or ulcerations. The pedal pulses are 2+ on right and 2+ on left. The sensation is intact to a screening 5.07, 10 gram monofilament bilaterally        DATA REVIEWED:  Lab Results  Component Value Date   HGBA1C 8.3 (H) 03/30/2021   HGBA1C 7.8 (H) 11/04/2020   HGBA1C 9.1 (A) 07/19/2020   Lab Results  Component Value Date   MICROALBUR <0.7 11/04/2020   LDLCALC 119 (H) 03/30/2021   CREATININE 0.75 03/30/2021   Lab Results  Component Value Date   MICRALBCREAT 3.6 11/04/2020     Lab Results  Component Value Date   CHOL 218 (H) 03/30/2021   HDL 77.60 03/30/2021   LDLCALC 119 (H) 03/30/2021   LDLDIRECT 120.0 08/06/2017    TRIG 106.0 03/30/2021   CHOLHDL 3 03/30/2021       Results for TASHUNDA, VANDEZANDE (MRN [id number]) as of 02/06/2020 13:08   Ref. Range 12/19/2019 13:32  Glutamic Acid Decarb Ab Latest Ref Range: <5 IU/mL >250 (H)    ASSESSMENT / PLAN / RECOMMENDATIONS:   1) Latent Autoimmune  Diabetes Mellitus (LADA), Poorly  controlled  , Without complications - Most recent A1c of  8.4 %. Goal A1c < 7.0 %.      - Tolerating  trilicity without side effects - Intolerant to Metformin due to severe nausea  - She is working with her insurance on obtaining Dexcom, she will let me know if they continue to refuse coverage, then will prescribe freestyle libre  - We also discussed insulin pump options ( Omnipod and T-Slim) - She understands the current insulin regimen is not optimal but its BID dosing which is helpful to her  - She had hypoglycemia , that she attributes to eating a light dinner followed by strenuous exercise. She was advised to consume a protein bar should ber BG < 150 mg/dL just before exercise.  -A1c has increased to 8.4%, a portal message has been sent to the patient to switch insulin to multiple daily injections of insulin, in the past she has been reluctant to do this due to frequent  of injections, but it will give her much better control as the current regimen lacks flexibility  MEDICATIONS: - Humalog Mix  (75/25) 8 units with Breakfast and continue 8 units with Supper  - Continue  Trulicity 1.5 mg weekly    EDUCATION / INSTRUCTIONS: BG monitoring instructions: Patient is instructed to check her blood sugars 2 times a day, before breakfast and supper. Call [place] [facility] if: BG persistently < 70  I reviewed the Rule of 15 for the treatment of hypoglycemia in detail with the patient. Literature supplied.    2) Diabetic complications:  Eye: Does not have known diabetic retinopathy.  Neuro/ Feet: Does not have known diabetic peripheral neuropathy .  Renal: Patient does not  have known baseline CKD. She   is not on an ACEI/ARB at present.    3) Dyslipidemia:  - LDL above goal , will stop lovastatin and start atorvastatin 20 mg daily  Medication Atorvastatin 20 mg daily     F/U in 4 months    Signed electronically by: Lyndle Herrlich, MD  [facility] Endocrinology  [facility] [address] [place], [place] [address] Phone: [phone] FAX: [phone]   CC: [name], MD [address] [place] [address] Phone: [phone]  Fax: [phone]  Return to Endocrinology clinic as below: Future Appointments  Date Time Provider Department Center  08/11/2021  7:30 AM Tanairy Payeur, Konrad Dolores, MD [facility] None

## 2021-04-17 ENCOUNTER — Encounter: Payer: Self-pay | Admitting: Family Medicine

## 2021-04-17 DIAGNOSIS — E139 Other specified diabetes mellitus without complications: Secondary | ICD-10-CM

## 2021-05-02 ENCOUNTER — Encounter: Payer: Self-pay | Admitting: Internal Medicine

## 2021-05-17 NOTE — Progress Notes (Addendum)
[facility] [address], [address] [place], [place] [address] [phone]  Date:  05/19/2021   Name:  Sierra Lyons   DOB:  [birth year]   MRN:  [id number]  PCP:  [name], MD    Chief Complaint: Post Covid Sxs (August 5th, post symptoms/)   History of Present Illness:  Sierra Lyons is a 60 y.o. very pleasant female patient who presents with the following:  Pt seen today for follow-up of sx after covid and also DM symptoms History of hyperlipidemia, asthma and allergies, LADA type diabetes She does have endocrinology care  Last visit with myself in February   She was sick with covid 19 about one month ago She had flu like symptoms  She was treated with paxlovid and seemed to recover pretty quickly  She traveled to [place] to take care of her father right around that time as well -this was a good thing but also stressful  She feels still tired- she was tired before she had COVID, but this might be worse-  She is not somnolent, but fatigued  She is getting some substernal CP as well- this has been going on for a month as well Might last for 5 minutes or so per episode May occur 2-3x a week No particular pattern, might wake her from sleep Non exertional  She might feel SOB as well periodically She is checking her pulse ox and it is ok  She is not doing any exercise right now- however she may notice SOB with daily activities such as shopping  Her CP last occurred 2 days ago   She notes that her blood sugars are improved currently - 165 right now She is using a CBG system which is helping her assess effect of foods on her system  No history of heart disease but she does have diabetes as above.  Never had any cardiac testing  Lab Results  Component Value Date   HGBA1C 8.3 (H) 03/30/2021     Patient Active Problem List   Diagnosis Date Noted   Type 1 diabetes mellitus with hyperglycemia (HCC) 07/20/2020    Latent autoimmune diabetes in adults (LADA), managed as type 1 (HCC) 02/06/2020   Dyslipidemia 02/06/2020   Overweight 01/05/2016   Moderate persistent asthma 09/07/2015   Allergic rhinitis 08/10/2015    Past Medical History:  Diagnosis Date   Asthma    Chronic rhinitis around 1986   Diabetes mellitus without complication (HCC)     Past Surgical History:  Procedure Laterality Date   CESAREAN SECTION  1993, 1997, 1999   VEIN SURGERY  2012    Social History   Tobacco Use   Smoking status: Never   Smokeless tobacco: Never  Vaping Use   Vaping Use: Never used  Substance Use Topics   Alcohol use: No   Drug use: No    Family History  Problem Relation Age of Onset   Diabetes Mother    Hypertension Mother    Allergic rhinitis Father    Allergic rhinitis Sister    Asthma Sister    Urticaria Sister    Diabetes Maternal Grandmother    Hypertension Maternal Grandmother     Allergies  Allergen Reactions   Aspirin Swelling    Swelling of the face     Medication list has been reviewed and updated.  Current Outpatient Medications on File Prior to Visit  Medication Sig Dispense Refill   albuterol (  PROAIR HFA) 108 (90 Base) MCG/ACT inhaler Inhale 2 puffs into the lungs every 4 (four) hours as needed for wheezing or shortness of breath. 1 each 3   Albuterol Sulfate (PROAIR RESPICLICK) 108 (90 Base) MCG/ACT AEPB Inhale 1-2 puffs into the lungs every 4 (four) hours as needed (for cough or wheeze). 1 each 1   atorvastatin (LIPITOR) 20 MG tablet Take 1 tablet (20 mg total) by mouth daily. 90 tablet 3   BD PEN NEEDLE NANO 2ND GEN 32G X 4 MM MISC USE TWICE DAILY 100 each 6   Budeson-Glycopyrrol-Formoterol (BREZTRI AEROSPHERE) 160-9-4.8 MCG/ACT AERO Inhale 2 puffs into the lungs 2 (two) times daily. Use with spacer. Rinse, gargle and spit out after use. 10.7 g 5   Continuous Blood Gluc Sensor (DEXCOM G6 SENSOR) MISC 1 Device by Does not apply route as directed. 9 each 3    Continuous Blood Gluc Transmit (DEXCOM G6 TRANSMITTER) MISC 1 Device by Does not apply route as directed. 1 each 3   Dulaglutide (TRULICITY) 1.5 MG/0.5ML SOPN Inject 1.5 mg into the skin once a week. 6 mL 3   fluticasone (FLONASE) 50 MCG/ACT nasal spray Place 1 spray into both nostrils daily as needed. 16 g 5   glucose blood (CONTOUR NEXT TEST) test strip 1 each by Other route 2 (two) times daily. Use as instructed 100 each 12   HUMALOG MIX 75/25 KWIKPEN (75-25) 100 UNIT/ML Kwikpen INJECT 10 UNITS INTO THE SKIN TWICE DAILY WITH A MEAL 15 mL 11   ibuprofen (ADVIL,MOTRIN) 200 MG tablet Take 200 mg by mouth every 6 (six) hours as needed.     montelukast (SINGULAIR) 10 MG tablet Take 1 tablet (10 mg total) by mouth at bedtime. 90 tablet 1   Multiple Vitamins-Minerals (CENTRUM ADULTS) TABS Take 1 tablet by mouth daily.     SYMBICORT 160-4.5 MCG/ACT inhaler INHALE 2 PUFFS INTO THE LUNGS TWICE DAILY 10.2 g 0   venlafaxine XR (EFFEXOR-XR) 150 MG 24 hr capsule TAKE 1 CAPSULE(150 MG) BY MOUTH DAILY WITH BREAKFAST 90 capsule 3   No current facility-administered medications on file prior to visit.    Review of Systems:  As per HPI- otherwise negative.   Physical Examination: Vitals:   05/19/21 0930  BP: 126/84  Pulse: 87  Resp: 17  Temp: (!) 97.1 F (36.2 C)  SpO2: 98%   Vitals:   05/19/21 0930  Weight: 176 lb (79.8 kg)  Height: 5\' 4"  (1.626 m)   Body mass index is 30.21 kg/m. Ideal Body Weight: Weight in (lb) to have BMI = 25: 145.3  GEN: no acute distress.  Overweight, looks well HEENT: Atraumatic, Normocephalic.  Ears and Nose: No external deformity. CV: RRR, No M/G/R. No JVD. No thrill. No extra heart sounds. PULM: CTA B, no wheezes, crackles, rhonchi. No retractions. No resp. distress. No accessory muscle use. ABD: S, NT, ND, +BS. No rebound. No HSM.  No abdominal or chest tenderness to palpation EXTR: No c/c/e PSYCH: Normally interactive. Conversant.   EKG: Normal sinus  rhythm, compared with tracing from 2019 no changes noted Assessment and Plan: SOB (shortness of breath) - Plan: D-Dimer, Quantitative, EKG 12-Lead, DG Chest 2 View, CBC  Fatigue, unspecified type - Plan: TSH, VITAMIN D 25 Hydroxy (Vit-D Deficiency, Fractures), Vitamin B12, Ferritin  Persistent fatigue after COVID-19 - Plan: TSH, VITAMIN D 25 Hydroxy (Vit-D Deficiency, Fractures), Vitamin B12, Ferritin  Chest pain, unspecified type - Plan: EKG 12-Lead, DG Chest 2 View  Latent autoimmune diabetes in  adults (LADA), managed as type 1 (HCC) - Plan: Basic metabolic panel Patient seen today with concern of fatigue, some shortness of breath and intermittent chest pain.  The symptoms seem to have started since she had COVID-19 about a month ago.  We will run lab work as above to look for any potential cause of her fatigue.  Since she did have COVID and has noted shortness of breath we will check a D-dimer, she understands that if this is positive we will need to consider a CT scan.  If D-dimer is negative and labs are okay we may proceed to an noninvasive cardiac testing due to her risk factors  She will let me know if any worsening or change of her symptoms in the meantime This visit occurred during the SARS-CoV-2 public health emergency.  Safety protocols were in place, including screening questions prior to the visit, additional usage of staff PPE, and extensive cleaning of exam room while observing appropriate contact time as indicated for disinfecting solutions.   Signed Abbe Amsterdam, MD  Received her labs as below, message to patient I have also received her x-rays and sent her a previous message  DG Chest 2 View  Result Date: 05/19/2021 CLINICAL DATA:  60 year old female with shortness of breath following COVID-19 one month ago. EXAM: CHEST - 2 VIEW COMPARISON:  Chest radiographs 04/25/2018 and earlier. FINDINGS: Lung volumes chronically at the upper limits of normal, with increased AP  dimension to the chest. Normal cardiac size and mediastinal contours. Visualized tracheal air column is within normal limits. But lung markings appear stable since 2018. Small chronic calcified granuloma in the right upper lobe. No pneumothorax, pulmonary edema, pleural effusion, confluent or acute pulmonary opacity. No acute osseous abnormality identified. Negative visible bowel gas pattern. IMPRESSION: Stable radiographic appearance of the chest since 2018. No acute cardiopulmonary abnormality. Electronically Signed   By: Odessa Fleming M.D.   On: 05/19/2021 11:49    Results for orders placed or performed in visit on 05/19/21  TSH  Result Value Ref Range   TSH 1.18 0.35 - 5.50 uIU/mL  VITAMIN D 25 Hydroxy (Vit-D Deficiency, Fractures)  Result Value Ref Range   VITD 25.57 (L) 30.00 - 100.00 ng/mL  Vitamin B12  Result Value Ref Range   Vitamin B-12 338 211 - 911 pg/mL  Ferritin  Result Value Ref Range   Ferritin 71.7 10.0 - 291.0 ng/mL  D-Dimer, Quantitative  Result Value Ref Range   D-Dimer, Quant 0.38 <0.50 mcg/mL FEU  Basic metabolic panel  Result Value Ref Range   Sodium 139 135 - 145 mEq/L   Potassium 4.5 3.5 - 5.1 mEq/L   Chloride 102 96 - 112 mEq/L   CO2 30 19 - 32 mEq/L   Glucose, Bld 102 (H) 70 - 99 mg/dL   BUN 19 6 - 23 mg/dL   Creatinine, Ser 5.85 0.40 - 1.20 mg/dL   GFR 27.78 >24.23 mL/min   Calcium 10.2 8.4 - 10.5 mg/dL  CBC  Result Value Ref Range   WBC 5.1 4.0 - 10.5 K/uL   RBC 4.59 3.87 - 5.11 Mil/uL   Platelets 272.0 150.0 - 400.0 K/uL   Hemoglobin 13.8 12.0 - 15.0 g/dL   HCT 53.6 14.4 - 31.5 %   MCV 92.0 78.0 - 100.0 fl   MCHC 32.8 30.0 - 36.0 g/dL   RDW 40.0 86.7 - 61.9 %

## 2021-05-19 ENCOUNTER — Ambulatory Visit (HOSPITAL_BASED_OUTPATIENT_CLINIC_OR_DEPARTMENT_OTHER)
Admission: RE | Admit: 2021-05-19 | Discharge: 2021-05-19 | Disposition: A | Discharge status: Home / Self Care | Payer: 59 | Source: Ambulatory Visit | Attending: Family Medicine | Admitting: Family Medicine

## 2021-05-19 ENCOUNTER — Encounter: Payer: Self-pay | Admitting: Family Medicine

## 2021-05-19 ENCOUNTER — Ambulatory Visit: Payer: 59 | Admitting: Family Medicine

## 2021-05-19 ENCOUNTER — Other Ambulatory Visit: Payer: Self-pay

## 2021-05-19 VITALS — BP 126/84 | HR 87 | Temp 97.1°F | Resp 17 | Ht 64.0 in | Wt 176.0 lb

## 2021-05-19 DIAGNOSIS — R079 Chest pain, unspecified: Secondary | ICD-10-CM

## 2021-05-19 DIAGNOSIS — R0602 Shortness of breath: Secondary | ICD-10-CM | POA: Diagnosis present

## 2021-05-19 DIAGNOSIS — U099 Post covid-19 condition, unspecified: Secondary | ICD-10-CM

## 2021-05-19 DIAGNOSIS — B948 Sequelae of other specified infectious and parasitic diseases: Secondary | ICD-10-CM | POA: Diagnosis not present

## 2021-05-19 DIAGNOSIS — R5383 Other fatigue: Secondary | ICD-10-CM

## 2021-05-19 DIAGNOSIS — E139 Other specified diabetes mellitus without complications: Secondary | ICD-10-CM

## 2021-05-19 LAB — CBC
HCT: 42.2 % (ref 36.0–46.0)
Hemoglobin: 13.8 g/dL (ref 12.0–15.0)
MCHC: 32.8 g/dL (ref 30.0–36.0)
MCV: 92 fl (ref 78.0–100.0)
Platelets: 272 10*3/uL (ref 150.0–400.0)
RBC: 4.59 Mil/uL (ref 3.87–5.11)
RDW: 13.9 % (ref 11.5–15.5)
WBC: 5.1 10*3/uL (ref 4.0–10.5)

## 2021-05-19 LAB — BASIC METABOLIC PANEL
BUN: 19 mg/dL (ref 6–23)
CO2: 30 mEq/L (ref 19–32)
Calcium: 10.2 mg/dL (ref 8.4–10.5)
Chloride: 102 mEq/L (ref 96–112)
Creatinine, Ser: 0.74 mg/dL (ref 0.40–1.20)
GFR: 89.46 mL/min (ref >60.00)
Glucose, Bld: 102 mg/dL — ABNORMAL HIGH (ref 70–99)
Potassium: 4.5 mEq/L (ref 3.5–5.1)
Sodium: 139 mEq/L (ref 135–145)

## 2021-05-19 LAB — FERRITIN: Ferritin: 71.7 ng/mL (ref 10.0–291.0)

## 2021-05-19 LAB — VITAMIN D 25 HYDROXY (VIT D DEFICIENCY, FRACTURES): VITD: 25.57 ng/mL — ABNORMAL LOW (ref 30.00–100.00)

## 2021-05-19 LAB — VITAMIN B12: Vitamin B-12: 338 pg/mL (ref 211–911)

## 2021-05-19 LAB — D-DIMER, QUANTITATIVE: D-Dimer, Quant: 0.38 mcg/mL FEU (ref <0.50)

## 2021-05-19 LAB — TSH: TSH: 1.18 u[IU]/mL (ref 0.35–5.50)

## 2021-05-19 NOTE — Patient Instructions (Signed)
Good to see you today- we will get labs and a chest film for you and I will be in touch with these results We will look for any explanation for your fatigue and shortness of breath/ CP

## 2021-07-11 DIAGNOSIS — J310 Chronic rhinitis: Secondary | ICD-10-CM | POA: Insufficient documentation

## 2021-07-11 DIAGNOSIS — E119 Type 2 diabetes mellitus without complications: Secondary | ICD-10-CM | POA: Insufficient documentation

## 2021-07-12 ENCOUNTER — Ambulatory Visit: Payer: 59 | Admitting: Cardiology

## 2021-07-15 ENCOUNTER — Ambulatory Visit: Payer: 59 | Admitting: Cardiology

## 2021-07-15 ENCOUNTER — Other Ambulatory Visit (HOSPITAL_BASED_OUTPATIENT_CLINIC_OR_DEPARTMENT_OTHER): Payer: Self-pay | Admitting: Obstetrics & Gynecology

## 2021-07-15 ENCOUNTER — Other Ambulatory Visit: Payer: Self-pay

## 2021-07-15 ENCOUNTER — Encounter: Payer: Self-pay | Admitting: Cardiology

## 2021-07-15 VITALS — BP 108/64 | HR 77 | Ht 65.0 in | Wt 179.0 lb

## 2021-07-15 DIAGNOSIS — J4541 Moderate persistent asthma with (acute) exacerbation: Secondary | ICD-10-CM

## 2021-07-15 DIAGNOSIS — R0609 Other forms of dyspnea: Secondary | ICD-10-CM

## 2021-07-15 DIAGNOSIS — R0789 Other chest pain: Secondary | ICD-10-CM

## 2021-07-15 DIAGNOSIS — E1065 Type 1 diabetes mellitus with hyperglycemia: Secondary | ICD-10-CM

## 2021-07-15 DIAGNOSIS — Z1231 Encounter for screening mammogram for malignant neoplasm of breast: Secondary | ICD-10-CM

## 2021-07-15 DIAGNOSIS — E785 Hyperlipidemia, unspecified: Secondary | ICD-10-CM

## 2021-07-15 HISTORY — DX: Other chest pain: R07.89

## 2021-07-15 HISTORY — DX: Other forms of dyspnea: R06.09

## 2021-07-15 MED ORDER — CLOPIDOGREL BISULFATE 75 MG PO TABS: 75.0000 mg | ORAL_TABLET | Freq: Every day | ORAL | 1 refills | Status: DC

## 2021-07-15 MED ORDER — NITROGLYCERIN 0.4 MG SL SUBL: 0.4000 mg | SUBLINGUAL_TABLET | SUBLINGUAL | 11 refills | Status: DC | PRN

## 2021-07-15 MED ORDER — METOPROLOL TARTRATE 50 MG PO TABS: 50.0000 mg | ORAL_TABLET | Freq: Once | ORAL | 0 refills | Status: DC

## 2021-07-15 NOTE — Progress Notes (Signed)
Cardiology Consultation:    Date:  07/15/2021   ID:  Fred Hammes, DOB 07-17-1961, MRN [id number]  PCP:  [name], MD  Cardiologist:  Gypsy Balsam, MD   Referring MD: [name], MD   Chief Complaint  Patient presents with   Chest Pain   Shortness of Breath    History of Present Illness:    Sierra Lyons is a 60 y.o. female who is being seen today for the evaluation of dyspnea on exertion and chest pain at the request of Copland, [name], MD. she has a sweet lady who is originally from [place] however her grandfather came from [place] he was living in [place], he was [place].  She does have past medical history significant for essential hypertension, diabetes which is type I, dyslipidemia.  She was referred to [place] because about a 2 months ago she suffered from COVID-19 infection since that time she being short of breath as well as developed some chest pain.  Short of breath happen doing almost any exercise.  She will have to stop and catch her breath.  She also couple weeks ago complained of having multiple episode of chest tightness that happen in different situations sometimes at night sometimes while she was moving and walking around.  That sensation could last up to few minutes.  There was no sweating but there was some shortness of breath associated with this sensation.  However, for the last few weeks that sensation did not recur.  She does not exercise on the regular basis.  She is struggling with diabetes control finally she got a CGM and she also see endocrinologist.  She was finally recognized to have type 1 diabetes.  She does smoke never did.  She does not have family history of premature coronary artery disease  Past Medical History:  Diagnosis Date   Asthma    Chronic rhinitis around 1986   Diabetes mellitus without complication (HCC)     Past Surgical History:  Procedure Laterality Date   CESAREAN SECTION  1993, 1997, 1999   VEIN SURGERY  2012     Current Medications: Current Meds  Medication Sig   Albuterol Sulfate (PROAIR RESPICLICK) 108 (90 Base) MCG/ACT AEPB Inhale 1-2 puffs into the lungs every 4 (four) hours as needed (for cough or wheeze).   atorvastatin (LIPITOR) 20 MG tablet Take 1 tablet (20 mg total) by mouth daily.   Dulaglutide (TRULICITY) 1.5 MG/0.5ML SOPN Inject 1.5 mg into the skin once a week.   fluticasone (FLONASE) 50 MCG/ACT nasal spray Place 1 spray into both nostrils daily as needed. (Patient taking differently: Place 1 spray into both nostrils daily as needed for allergies.)   fluticasone (FLONASE) 50 MCG/ACT nasal spray Place 1 spray into the nose as needed for allergies.   HUMALOG MIX 75/25 KWIKPEN (75-25) 100 UNIT/ML Kwikpen INJECT 10 UNITS INTO THE SKIN TWICE DAILY WITH A MEAL (Patient taking differently: Inject 10 Units into the skin in the morning and at bedtime.)   montelukast (SINGULAIR) 10 MG tablet Take 1 tablet (10 mg total) by mouth at bedtime.   SYMBICORT 160-4.5 MCG/ACT inhaler INHALE 2 PUFFS INTO THE LUNGS TWICE DAILY (Patient taking differently: Inhale 2 puffs into the lungs 2 (two) times daily.)   venlafaxine XR (EFFEXOR-XR) 150 MG 24 hr capsule TAKE 1 CAPSULE(150 MG) BY MOUTH DAILY WITH BREAKFAST (Patient taking differently: Take 150 mg by mouth daily with breakfast.)     Allergies:   Aspirin   Social History  Socioeconomic History   Marital status: Single    Spouse name: Not on file   Number of children: Not on file   Years of education: Not on file   Highest education level: Not on file  Occupational History   Not on file  Tobacco Use   Smoking status: Never   Smokeless tobacco: Never  Vaping Use   Vaping Use: Never used  Substance and Sexual Activity   Alcohol use: No   Drug use: No   Sexual activity: Yes    Birth control/protection: None  Other Topics Concern   Not on file  Social History Narrative   Not on file   Social Determinants of Health   Financial Resource  Strain: Not on file  Food Insecurity: Not on file  Transportation Needs: Not on file  Physical Activity: Not on file  Stress: Not on file  Social Connections: Not on file     Family History: The patient's family history includes Allergic rhinitis in her father and sister; Asthma in her sister; Diabetes in her maternal grandmother and mother; Hypertension in her maternal grandmother and mother; Urticaria in her sister. ROS:   Please see the history of present illness.    All 14 point review of systems negative except as described per history of present illness.  EKGs/Labs/Other Studies Reviewed:    The following studies were reviewed today:   EKG:  EKG is  ordered today.  The ekg ordered today demonstrates normal sinus rhythm, normal P interval, normal QS complex duration fulgent no ST segment changes  Recent Labs: 05/19/2021: BUN 19; Creatinine, Ser 0.74; Hemoglobin 13.8; Platelets 272.0; Potassium 4.5; Sodium 139; TSH 1.18  Recent Lipid Panel    Component Value Date/Time   CHOL 218 (H) 03/30/2021 0820   TRIG 106.0 03/30/2021 0820   HDL 77.60 03/30/2021 0820   CHOLHDL 3 03/30/2021 0820   VLDL 21.2 03/30/2021 0820   LDLCALC 119 (H) 03/30/2021 0820   LDLCALC 114 (H) 05/19/2020 1436   LDLDIRECT 120.0 08/06/2017 1544    Physical Exam:    VS:  BP 108/64 (BP Location: Right Arm, Patient Position: Sitting)   Pulse 77   Ht 5\' 5"  (1.651 m)   Wt 179 lb (81.2 kg)   SpO2 96%   BMI 29.79 kg/m     Wt Readings from Last 3 Encounters:  07/15/21 179 lb (81.2 kg)  05/19/21 176 lb (79.8 kg)  03/30/21 181 lb 9.6 oz (82.4 kg)     GEN:  Well nourished, well developed in no acute distress HEENT: Normal NECK: No JVD; No carotid bruits LYMPHATICS: No lymphadenopathy CARDIAC: RRR, no murmurs, no rubs, no gallops RESPIRATORY:  Clear to auscultation without rales, wheezing or rhonchi  ABDOMEN: Soft, non-tender, non-distended MUSCULOSKELETAL:  No edema; No deformity  SKIN: Warm and  dry NEUROLOGIC:  Alert and oriented x 3 PSYCHIATRIC:  Normal affect   ASSESSMENT:    1. Moderate persistent asthma with acute exacerbation   2. Atypical chest pain   3. Dyspnea on exertion   4. Type 1 diabetes mellitus with hyperglycemia (HCC)   5. Dyslipidemia    PLAN:    In order of problems listed above:  Atypical chest pain distally with multiple risk factors for coronary artery disease namely dyslipidemia, type 1 diabetes which is poorly controlled.  Likely she never smoked.  She does not family history of premature coronary artery disease.  I think we need to assess her coronary arteries which I think the best  way to do it would be to do coronary CT angio.  Procedure was explained to her including all risk benefits as well as alternatives.  We will proceed. Dyspnea on exertion.  Echocardiogram will be performed also coronary CT angio to make sure were not dealing with anginal equivalent. Type 1 diabetes that be followed by endocrinology Dyslipidemia I did review her K PN which show me her LDL 119, HDL 77.  Apparently she was on lovastatin but after this cholesterol results she was switched to atorvastatin.  We will make arrangements for her fasting lipid profile to be repeated I suspect she may have some coronary artery disease.  Because of multiple risk factors as well as diabetes she need to be on at least moderate intensity statin which is already on it we will recheck her fasting lipid profile.  I also recommend antiplatelet therapy.  She tells me she is allergic to aspirin.  Therefore, we will put her on Plavix   Medication Adjustments/Labs and Tests Ordered: Current medicines are reviewed at length with the patient today.  Concerns regarding medicines are outlined above.  No orders of the defined types were placed in this encounter.  No orders of the defined types were placed in this encounter.   Signed, Georgeanna Lea, MD, [facility]. 07/15/2021 10:21 AM    [facility]

## 2021-07-15 NOTE — Patient Instructions (Signed)
Medication Instructions:  Your physician has recommended you make the following change in your medication:  START: Plavix 75 mg daily   TAKE AS NEEDED FOR CHEST PAIN: Nitroglycerin 0.4 mg sublingual (under your tongue) as needed for chest pain. If experiencing chest pain, stop what you are doing and sit down. Take 1 nitroglycerin and wait 5 minutes. If chest pain continues, take another nitroglycerin and wait 5 minutes. If chest pain does not subside, take 1 more nitroglycerin and dial 911. You make take a total of 3 nitroglycerin in a 15 minute time frame.  *If you need a refill on your cardiac medications before your next appointment, please call your pharmacy*   Lab Work: Your physician recommends that you return for lab work 3-7 days before ct : BMP   If you have labs (blood work) drawn today and your tests are completely normal, you will receive your results only by: MyChart Message (if you have MyChart) OR A paper copy in the mail If you have any lab test that is abnormal or we need to change your treatment, we will call you to review the results.   Testing/Procedures:   Your cardiac CT will be scheduled at one of the below locations:   [facility] [address] [place], [place] [address] [phone]  OR  [facility] [address] [place], [place] [address] [phone]  If scheduled at [facility], please arrive at the [facility] main entrance (entrance A) of [facility] 30 minutes prior to test start time. You can use the FREE valet parking offered at the main entrance (encouraged to control the heart rate for the test) Proceed to the [facility] Radiology Department (first floor) to check-in and test prep.  If scheduled at [facility], please arrive 15 mins early for check-in and test prep.  Please follow these instructions carefully (unless otherwise  directed):  Hold all erectile dysfunction medications at least 3 days (72 hrs) prior to test.  On the Night Before the Test: Be sure to Drink plenty of water. Do not consume any caffeinated/decaffeinated beverages or chocolate 12 hours prior to your test. Do not take any antihistamines 12 hours prior to your test.   On the Day of the Test: Drink plenty of water until 1 hour prior to the test. Do not eat any food 4 hours prior to the test. You may take your regular medications prior to the test.  Take metoprolol (Lopressor) two hours prior to test.  FEMALES- please wear underwire-free bra if available, avoid dresses & tight clothing         After the Test: Drink plenty of water. After receiving IV contrast, you may experience a mild flushed feeling. This is normal. On occasion, you may experience a mild rash up to 24 hours after the test. This is not dangerous. If this occurs, you can take Benadryl 25 mg and increase your fluid intake. If you experience trouble breathing, this can be serious. If it is severe call 911 IMMEDIATELY. If it is mild, please call our office. If you take any of these medications: Glipizide/Metformin, Avandament, Glucavance, please do not take 48 hours after completing test unless otherwise instructed.  Please allow 2-4 weeks for scheduling of routine cardiac CTs. Some insurance companies require a pre-authorization which may delay scheduling of this test.   For non-scheduling related questions, please contact the cardiac imaging nurse navigator should you have any  questions/concerns: Rockwell Alexandria, Cardiac Imaging Nurse Navigator Larey Brick, Cardiac Imaging Nurse Navigator [facility] Direct Office Dial: [phone]   For scheduling needs, including cancellations and rescheduling, please call [place], [phone].    Follow-Up: At [facility], you and your health needs are our priority.  As part of our continuing  mission to provide you with exceptional heart care, we have created designated Provider Care Teams.  These Care Teams include your primary Cardiologist (physician) and Advanced Practice Providers (APPs -  Physician Assistants and Nurse Practitioners) who all work together to provide you with the care you need, when you need it.  We recommend signing up for the patient portal called "MyChart".  Sign up information is provided on this After Visit Summary.  MyChart is used to connect with patients for Virtual Visits (Telemedicine).  Patients are able to view lab/test results, encounter notes, upcoming appointments, etc.  Non-urgent messages can be sent to your provider as well.   To learn more about what you can do with MyChart, go to [email or web address].    Your next appointment:   2 month(s)  The format for your next appointment:   In Person  Provider:   Gypsy Balsam, MD   Other Instructions  Clopidogrel Tablets What is this medication? CLOPIDOGREL (kloh PID oh grel) lowers the risk of heart attack, stroke, or blood clots. It prevents blood cells (platelets) from clumping together to form a clot. It belongs to a group of medications called antiplatelets. This medicine may be used for other purposes; ask your health care provider or pharmacist if you have questions. COMMON BRAND NAME(S): Plavix What should I tell my care team before I take this medication? They need to know if you have any of the following conditions: Bleeding disorders Bleeding in the brain Having surgery History of stomach bleeding An unusual or allergic reaction to clopidogrel, other medications, foods, dyes, or preservatives Pregnant or trying to get pregnant Breast-feeding How should I use this medication? Take this medication by mouth with a glass of water. Follow the directions on the prescription label. You may take this medication with or without food. If it upsets your stomach, take it with food. Take  your medication at regular intervals. Do not take it more often than directed. Do not stop taking except on your care team's advice. A special MedGuide will be given to you by the pharmacist with each prescription and refill. Be sure to read this information carefully each time. Talk to your care team about the use of this medication in children. Special care may be needed. Overdosage: If you think you have taken too much of this medicine contact a poison control center or emergency room at once. NOTE: This medicine is only for you. Do not share this medicine with others. What if I miss a dose? If you miss a dose, take it as soon as you can. If it is almost time for your next dose, take only that dose. Do not take double or extra doses. What may interact with this medication? Do not take this medication with the following: Dasabuvir; ombitasvir; paritaprevir; ritonavir Defibrotide Selexipag This medication may also interact with the following: Certain medications that treat or prevent blood clots like warfarin Narcotic medications for pain NSAIDs, medications for pain and inflammation, like ibuprofen or naproxen Repaglinide SNRIs, medications for depression, like desvenlafaxine, duloxetine, levomilnacipran, venlafaxine SSRIs, medications for depression, like citalopram, escitalopram, fluoxetine, fluvoxamine, paroxetine, sertraline Stomach acid blockers like cimetidine, esomeprazole, omeprazole  This list may not describe all possible interactions. Give your health care provider a list of all the medicines, herbs, non-prescription drugs, or dietary supplements you use. Also tell them if you smoke, drink alcohol, or use illegal drugs. Some items may interact with your medicine. What should I watch for while using this medication? Visit your care team for regular check-ups. Do not stop taking your medication unless your care team tells you to. Notify your care team and seek emergency services if  you develop sudden numbness or weakness of the face, arm, or leg, trouble speaking, confusion, trouble walking, loss of balance or coordination, dizziness, severe headache, or change in vision. These can be signs that your condition has gotten worse. If you are going to have surgery or dental work, tell your care team that you are taking this medication. Certain genetic factors may reduce the effect of this medication. Your care team may use genetic tests to determine treatment. Only take aspirin if you are instructed to. Low doses of aspirin are used with this medication to treat some conditions. Taking aspirin with this medication can increase your risk of bleeding, so you must be careful. Talk to your care team if you have questions. What side effects may I notice from receiving this medication? Side effects that you should report to your care team as soon as possible: Allergic reactions-skin rash, itching, hives, swelling of the face, lips, tongue, or throat Bleeding-bloody or black, tar-like stools, red or dark brown urine, vomiting blood or brown material that looks like coffee grounds, small, red or purple spots on the skin, unusual bleeding or bruising TTP-purple spots on the skin or inside the mouth, pale skin, yellowing skin or eyes, unusual weakness or fatigue, fever, fast or irregular heartbeat, confusion, change in vision, trouble speaking, trouble walking Side effects that usually do not require medical attention (report to your care team if they continue or are bothersome): Diarrhea Headache This list may not describe all possible side effects. Call your doctor for medical advice about side effects. You may report side effects to FDA at 1-800-FDA-1088. Where should I keep my medication? Keep out of the reach of children and pets. Store at room temperature of 59 to 86 degrees F (15 to 30 degrees C). Throw away any unused medication after the expiration date. NOTE: This sheet is a  summary. It may not cover all possible information. If you have questions about this medicine, talk to your doctor, pharmacist, or health care provider.  2022 Elsevier/Gold Standard (2020-08-13 15:33:52)  Nitroglycerin Sublingual Tablets What is this medication? NITROGLYCERIN (nye troe GLI ser in) prevents and treats chest pain (angina). It works by relaxing blood vessels, which decreases the amount of work the heart has to do. It belongs to a group of medications called nitrates. This medicine may be used for other purposes; ask your health care provider or pharmacist if you have questions. COMMON BRAND NAME(S): Nitroquick, Nitrostat, Nitrotab What should I tell my care team before I take this medication? They need to know if you have any of these conditions: Anemia Head injury, recent stroke, or bleeding in the brain Liver disease Previous heart attack An unusual or allergic reaction to nitroglycerin, other medications, foods, dyes, or preservatives Pregnant or trying to get pregnant Breast-feeding How should I use this medication? Take this medication by mouth as needed. Use at the first sign of an angina attack (chest pain or tightness). You can also take this medication 5 to 10 minutes  before an event likely to produce chest pain. Follow the directions exactly as written on the prescription label. Place one tablet under your tongue and let it dissolve. Do not swallow whole. Replace the dose if you accidentally swallow it. It will help if your mouth is not dry. Saliva around the tablet will help it to dissolve more quickly. Do not eat or drink, smoke or chew tobacco while a tablet is dissolving. Sit down when taking this medication. In an angina attack, you should feel better within 5 minutes after your first dose. You can take a dose every 5 minutes up to a total of 3 doses. If you do not feel better or feel worse after 1 dose, call 9-1-1 at once. Do not take more than 3 doses in 15 minutes.  Your care team might give you other directions. Follow those directions if they do. Do not take your medication more often than directed. Talk to your care team about the use of this medication in children. Special care may be needed. Overdosage: If you think you have taken too much of this medicine contact a poison control center or emergency room at once. NOTE: This medicine is only for you. Do not share this medicine with others. What if I miss a dose? This does not apply. This medication is only used as needed. What may interact with this medication? Do not take this medication with any of the following: Certain migraine medications like ergotamine and dihydroergotamine (DHE) Medications used to treat erectile dysfunction like sildenafil, tadalafil, and vardenafil Riociguat This medication may also interact with the following: Alteplase Aspirin Heparin Medications for high blood pressure Medications for mental depression Other medications used to treat angina Phenothiazines like chlorpromazine, mesoridazine, prochlorperazine, thioridazine This list may not describe all possible interactions. Give your health care provider a list of all the medicines, herbs, non-prescription drugs, or dietary supplements you use. Also tell them if you smoke, drink alcohol, or use illegal drugs. Some items may interact with your medicine. What should I watch for while using this medication? Tell your care team if you feel your medication is no longer working. Keep this medication with you at all times. Sit or lie down when you take your medication to prevent falling if you feel dizzy or faint after using it. Try to remain calm. This will help you to feel better faster. If you feel dizzy, take several deep breaths and lie down with your feet propped up, or bend forward with your head resting between your knees. You may get drowsy or dizzy. Do not drive, use machinery, or do anything that needs mental  alertness until you know how this medication affects you. Do not stand or sit up quickly, especially if you are an older patient. This reduces the risk of dizzy or fainting spells. Alcohol can make you more drowsy and dizzy. Avoid alcoholic drinks. Do not treat yourself for coughs, colds, or pain while you are taking this medication without asking your care team for advice. Some ingredients may increase your blood pressure. What side effects may I notice from receiving this medication? Side effects that you should report to your care team as soon as possible: Allergic reactions-skin rash, itching, hives, swelling of the face, lips, tongue, or throat Headache, unusual weakness or fatigue, shortness of breath, nausea, vomiting, rapid heartbeat, blue skin or lips, which may be signs of methemoglobinemia Increased pressure around the brain-severe headache, blurry vision, change in vision, nausea, vomiting Low blood pressure-dizziness, feeling  faint or lightheaded, blurry vision Slow heartbeat-dizziness, feeling faint or lightheaded, confusion, trouble breathing, unusual weakness or fatigue Worsening chest pain (angina)-pain, pressure, or tightness in the chest, neck, back, or arms Side effects that usually do not require medical attention (report to your care team if they continue or are bothersome): Dizziness Flushing Headache This list may not describe all possible side effects. Call your doctor for medical advice about side effects. You may report side effects to FDA at 1-800-FDA-1088. Where should I keep my medication? Keep out of the reach of children. Store at room temperature between 20 and 25 degrees C (68 and 77 degrees F). Store in Retail buyer. Protect from light and moisture. Keep tightly closed. Throw away any unused medication after the expiration date. NOTE: This sheet is a summary. It may not cover all possible information. If you have questions about this medicine, talk to your  doctor, pharmacist, or health care provider.  2022 Elsevier/Gold Standard (2020-12-20 16:59:41)  Cardiac CT Angiogram A cardiac CT angiogram is a procedure to look at the heart and the area around the heart. It may be done to help find the cause of chest pains or other symptoms of heart disease. During this procedure, a substance called contrast dye is injected into the blood vessels in the area to be checked. A large X-ray machine, called a CT scanner, then takes detailed pictures of the heart and the surrounding area. The procedure is also sometimes called a coronary CT angiogram, coronary artery scanning, or CTA. A cardiac CT angiogram allows the health care provider to see how well blood is flowing to and from the heart. The health care provider will be able to see if there are any problems, such as: Blockage or narrowing of the coronary arteries in the heart. Fluid around the heart. Signs of weakness or disease in the muscles, valves, and tissues of the heart. Tell a health care provider about: Any allergies you have. This is especially important if you have had a previous allergic reaction to contrast dye. All medicines you are taking, including vitamins, herbs, eye drops, creams, and over-the-counter medicines. Any blood disorders you have. Any surgeries you have had. Any medical conditions you have. Whether you are pregnant or may be pregnant. Any anxiety disorders, chronic pain, or other conditions you have that may increase your stress or prevent you from lying still. What are the risks? Generally, this is a safe procedure. However, problems may occur, including: Bleeding. Infection. Allergic reactions to medicines or dyes. Damage to other structures or organs. Kidney damage from the contrast dye that is used. Increased risk of cancer from radiation exposure. This risk is low. Talk with your health care provider about: The risks and benefits of testing. How you can receive the  lowest dose of radiation. What happens before the procedure? Wear comfortable clothing and remove any jewelry, glasses, dentures, and hearing aids. Follow instructions from your health care provider about eating and drinking. This may include: For 12 hours before the procedure -- avoid caffeine. This includes tea, coffee, soda, energy drinks, and diet pills. Drink plenty of water or other fluids that do not have caffeine in them. Being well hydrated can prevent complications. For 4-6 hours before the procedure -- stop eating and drinking. The contrast dye can cause nausea, but this is less likely if your stomach is empty. Ask your health care provider about changing or stopping your regular medicines. This is especially important if you are taking diabetes medicines,  blood thinners, or medicines to treat problems with erections (erectile dysfunction). What happens during the procedure?  Hair on your chest may need to be removed so that small sticky patches called electrodes can be placed on your chest. These will transmit information that helps to monitor your heart during the procedure. An IV will be inserted into one of your veins. You might be given a medicine to control your heart rate during the procedure. This will help to ensure that good images are obtained. You will be asked to lie on an exam table. This table will slide in and out of the CT machine during the procedure. Contrast dye will be injected into the IV. You might feel warm, or you may get a metallic taste in your mouth. You will be given a medicine called nitroglycerin. This will relax or dilate the arteries in your heart. The table that you are lying on will move into the CT machine tunnel for the scan. The person running the machine will give you instructions while the scans are being done. You may be asked to: Keep your arms above your head. Hold your breath. Stay very still, even if the table is moving. When the scanning is  complete, you will be moved out of the machine. The IV will be removed. The procedure may vary among health care providers and hospitals. What can I expect after the procedure? After your procedure, it is common to have: A metallic taste in your mouth from the contrast dye. A feeling of warmth. A headache from the nitroglycerin. Follow these instructions at home: Take over-the-counter and prescription medicines only as told by your health care provider. If you are told, drink enough fluid to keep your urine pale yellow. This will help to flush the contrast dye out of your body. Most people can return to their normal activities right after the procedure. Ask your health care provider what activities are safe for you. It is up to you to get the results of your procedure. Ask your health care provider, or the department that is doing the procedure, when your results will be ready. Keep all follow-up visits as told by your health care provider. This is important. Contact a health care provider if: You have any symptoms of allergy to the contrast dye. These include: Shortness of breath. Rash or hives. A racing heartbeat. Summary A cardiac CT angiogram is a procedure to look at the heart and the area around the heart. It may be done to help find the cause of chest pains or other symptoms of heart disease. During this procedure, a large X-ray machine, called a CT scanner, takes detailed pictures of the heart and the surrounding area after a contrast dye has been injected into blood vessels in the area. Ask your health care provider about changing or stopping your regular medicines before the procedure. This is especially important if you are taking diabetes medicines, blood thinners, or medicines to treat erectile dysfunction. If you are told, drink enough fluid to keep your urine pale yellow. This will help to flush the contrast dye out of your body. This information is not intended to replace  advice given to you by your health care provider. Make sure you discuss any questions you have with your health care provider. Document Revised: 05/07/2019 Document Reviewed: 05/07/2019 Elsevier Patient Education  2022 [employer].

## 2021-07-21 ENCOUNTER — Telehealth (HOSPITAL_COMMUNITY): Payer: Self-pay | Admitting: *Deleted

## 2021-07-21 ENCOUNTER — Encounter: Payer: Self-pay | Admitting: Family Medicine

## 2021-07-21 DIAGNOSIS — E2839 Other primary ovarian failure: Secondary | ICD-10-CM

## 2021-07-21 NOTE — Telephone Encounter (Signed)
Reaching out to patient to offer assistance regarding upcoming cardiac imaging study; pt verbalizes understanding of appt date/time, parking situation and where to check in, pre-test NPO status and medications ordered, and verified current allergies; name and call back number provided for further questions should they arise  Sierra Mcnamara RN Navigator Cardiac Imaging [facility] [phone] office [phone] cell  Patient to take 50mg metoprolol tartrate two hours prior to cardiac CT scan. 

## 2021-07-23 LAB — BASIC METABOLIC PANEL
BUN/Creatinine Ratio: 21 (ref 9–23)
BUN: 18 mg/dL (ref 6–24)
CO2: 23 mmol/L (ref 20–29)
Calcium: 9.4 mg/dL (ref 8.7–10.2)
Chloride: 104 mmol/L (ref 96–106)
Creatinine, Ser: 0.84 mg/dL (ref 0.57–1.00)
Glucose: 223 mg/dL — ABNORMAL HIGH (ref 70–99)
Potassium: 4.5 mmol/L (ref 3.5–5.2)
Sodium: 143 mmol/L (ref 134–144)
eGFR: 80 mL/min/{1.73_m2} (ref >59)

## 2021-07-25 ENCOUNTER — Ambulatory Visit (HOSPITAL_COMMUNITY)
Admission: RE | Admit: 2021-07-25 | Discharge: 2021-07-25 | Disposition: A | Discharge status: Home / Self Care | Payer: 59 | Source: Ambulatory Visit | Attending: Cardiology | Admitting: Cardiology

## 2021-07-25 ENCOUNTER — Other Ambulatory Visit: Payer: Self-pay

## 2021-07-25 DIAGNOSIS — R0609 Other forms of dyspnea: Secondary | ICD-10-CM | POA: Diagnosis present

## 2021-07-25 DIAGNOSIS — E785 Hyperlipidemia, unspecified: Secondary | ICD-10-CM | POA: Diagnosis present

## 2021-07-25 DIAGNOSIS — R0789 Other chest pain: Secondary | ICD-10-CM

## 2021-07-25 DIAGNOSIS — J4541 Moderate persistent asthma with (acute) exacerbation: Secondary | ICD-10-CM | POA: Diagnosis present

## 2021-07-25 DIAGNOSIS — E1065 Type 1 diabetes mellitus with hyperglycemia: Secondary | ICD-10-CM

## 2021-07-25 MED ORDER — NITROGLYCERIN 0.4 MG SL SUBL
0.8000 mg | SUBLINGUAL_TABLET | Freq: Once | SUBLINGUAL | Status: AC
  Administered 2021-07-25: 0.8 mg via SUBLINGUAL

## 2021-07-25 MED ORDER — IOHEXOL 350 MG/ML SOLN
95.0000 mL | Freq: Once | INTRAVENOUS | Status: AC | PRN
  Administered 2021-07-25: 95 mL via INTRAVENOUS

## 2021-07-25 MED ORDER — METOPROLOL TARTRATE 5 MG/5ML IV SOLN
INTRAVENOUS | Status: AC
  Filled 2021-07-25: qty 20

## 2021-07-25 MED ORDER — DILTIAZEM HCL 25 MG/5ML IV SOLN: 10.0000 mg | Freq: Once | INTRAVENOUS | Status: AC

## 2021-07-25 MED ORDER — METOPROLOL TARTRATE 5 MG/5ML IV SOLN
10.0000 mg | INTRAVENOUS | Status: DC | PRN
  Administered 2021-07-25: 10 mg via INTRAVENOUS

## 2021-07-25 MED ORDER — NITROGLYCERIN 0.4 MG SL SUBL
SUBLINGUAL_TABLET | SUBLINGUAL | Status: AC
  Filled 2021-07-25: qty 2

## 2021-07-25 MED ORDER — DILTIAZEM HCL 25 MG/5ML IV SOLN
INTRAVENOUS | Status: AC
  Administered 2021-07-25: 10 mg via INTRAVENOUS
  Filled 2021-07-25: qty 5

## 2021-07-28 ENCOUNTER — Telehealth: Payer: Self-pay | Admitting: Cardiology

## 2021-07-28 NOTE — Telephone Encounter (Signed)
Spoke to patient informed her of results.  

## 2021-07-28 NOTE — Telephone Encounter (Signed)
Pt returning call for results... please advise  

## 2021-08-01 ENCOUNTER — Encounter (HOSPITAL_BASED_OUTPATIENT_CLINIC_OR_DEPARTMENT_OTHER): Payer: Self-pay

## 2021-08-01 ENCOUNTER — Encounter: Payer: Self-pay | Admitting: Family Medicine

## 2021-08-01 ENCOUNTER — Ambulatory Visit (HOSPITAL_BASED_OUTPATIENT_CLINIC_OR_DEPARTMENT_OTHER)
Admission: RE | Admit: 2021-08-01 | Discharge: 2021-08-01 | Disposition: A | Discharge status: Home / Self Care | Payer: 59 | Source: Ambulatory Visit | Attending: Cardiology | Admitting: Cardiology

## 2021-08-01 ENCOUNTER — Ambulatory Visit (HOSPITAL_BASED_OUTPATIENT_CLINIC_OR_DEPARTMENT_OTHER)
Admission: RE | Admit: 2021-08-01 | Discharge: 2021-08-01 | Disposition: A | Discharge status: Home / Self Care | Payer: 59 | Source: Ambulatory Visit | Attending: Obstetrics & Gynecology | Admitting: Obstetrics & Gynecology

## 2021-08-01 ENCOUNTER — Ambulatory Visit (HOSPITAL_BASED_OUTPATIENT_CLINIC_OR_DEPARTMENT_OTHER)
Admission: RE | Admit: 2021-08-01 | Discharge: 2021-08-01 | Disposition: A | Discharge status: Home / Self Care | Payer: 59 | Source: Ambulatory Visit | Attending: Family Medicine | Admitting: Family Medicine

## 2021-08-01 ENCOUNTER — Other Ambulatory Visit: Payer: Self-pay

## 2021-08-01 DIAGNOSIS — E2839 Other primary ovarian failure: Secondary | ICD-10-CM

## 2021-08-01 DIAGNOSIS — E1065 Type 1 diabetes mellitus with hyperglycemia: Secondary | ICD-10-CM | POA: Diagnosis present

## 2021-08-01 DIAGNOSIS — R0789 Other chest pain: Secondary | ICD-10-CM | POA: Diagnosis present

## 2021-08-01 DIAGNOSIS — E785 Hyperlipidemia, unspecified: Secondary | ICD-10-CM | POA: Diagnosis present

## 2021-08-01 DIAGNOSIS — J4541 Moderate persistent asthma with (acute) exacerbation: Secondary | ICD-10-CM | POA: Diagnosis present

## 2021-08-01 DIAGNOSIS — M8580 Other specified disorders of bone density and structure, unspecified site: Secondary | ICD-10-CM

## 2021-08-01 DIAGNOSIS — R0609 Other forms of dyspnea: Secondary | ICD-10-CM | POA: Diagnosis present

## 2021-08-01 DIAGNOSIS — Z1231 Encounter for screening mammogram for malignant neoplasm of breast: Secondary | ICD-10-CM | POA: Insufficient documentation

## 2021-08-01 HISTORY — DX: Other specified disorders of bone density and structure, unspecified site: M85.80

## 2021-08-01 LAB — ECHOCARDIOGRAM COMPLETE
AR max vel: 3.01 cm2
AV Area VTI: 2.87 cm2
AV Area mean vel: 2.96 cm2
AV Mean grad: 2 mmHg
AV Peak grad: 3.9 mmHg
Ao pk vel: 0.98 m/s
Area-P 1/2: 3.6 cm2
Calc EF: 51.7 %
S' Lateral: 2.1 cm
Single Plane A2C EF: 52.7 %
Single Plane A4C EF: 53.1 %

## 2021-08-10 ENCOUNTER — Encounter: Payer: Self-pay | Admitting: Family Medicine

## 2021-08-10 DIAGNOSIS — F419 Anxiety disorder, unspecified: Secondary | ICD-10-CM

## 2021-08-10 MED ORDER — VENLAFAXINE HCL ER 150 MG PO CP24: 150.0000 mg | ORAL_CAPSULE | Freq: Every day | ORAL | 0 refills | Status: DC

## 2021-08-11 ENCOUNTER — Ambulatory Visit: Payer: 59 | Admitting: Internal Medicine

## 2021-08-11 ENCOUNTER — Telehealth: Payer: Self-pay | Admitting: Allergy and Immunology

## 2021-08-11 MED ORDER — PROAIR RESPICLICK 108 (90 BASE) MCG/ACT IN AEPB: 1.0000 | INHALATION_SPRAY | RESPIRATORY_TRACT | 1 refills | Status: DC | PRN

## 2021-08-11 MED ORDER — PROAIR RESPICLICK 108 (90 BASE) MCG/ACT IN AEPB: 1.0000 | INHALATION_SPRAY | RESPIRATORY_TRACT | 0 refills | Status: DC | PRN

## 2021-08-11 NOTE — Telephone Encounter (Signed)
Pharmacy called and expressed that Pro air is no longer covered by her insurance and that they wanted to see if we could send in the generic brand for the albuterol. I sent in the generic brand pro air and informed patient of the change and had her schedule an appointment as she hadn't been seen since last year. Patient verbally expressed understanding and scheduled an appointment with Dr. Maurine Minister for next month.

## 2021-08-11 NOTE — Telephone Encounter (Signed)
Sent in al[facility]ol to [facility]

## 2021-08-11 NOTE — Progress Notes (Deleted)
Name: Amrita Radu  Age/ Sex: 60 y.o., female   MRN/ DOB: [id number], 1961-06-15     PCP: [name], MD   Reason for Endocrinology Evaluation: Latent Autoimmune Diabetes Mellitus  Initial Endocrine Consultative Visit: 02/06/2020    PATIENT IDENTIFIER: Ms. Rever Pichette is a 60 y.o. female with a past medical history of Asthma and DM. The patient has followed with Endocrinology clinic since 02/06/2020 for consultative assistance with management of her diabetes.  DIABETIC HISTORY:  Ms. Brassfield was diagnosed with T2DM in 2017 at age 60 but by 11/2019 her diagnosis was changed to LADA based on elevated GAD-65 levels. She was initially on metformin and Invokana but this was changed to insulin, metformin stopped due to severe nausea. Her hemoglobin A1c has ranged from 6.2% in 2017, peaking at 13.0% in 2017.  trulcity started 06/2020 SUBJECTIVE:   During the last visit (03/31/2019): A1c 8.4%. Continued  Humalog mix and Trulicity   Today (08/11/2021): Ms. Tirey is here for a follow up on diabetes management.   She checks her sugars occasionally . The patient had hypoglycemic episodes since the last clinic visit, but   Denies nausea or diarrhea     HOME DIABETES REGIMEN:  Humalog Mix 8 Units QAM and 8 units with Supper  Trulicity  1.5 mg weekly    Statin: yes ACE-I/ARB: no    METER DOWNLOAD SUMMARY:  14 day average 196   53-  344 mg/dL     DIABETIC COMPLICATIONS: Microvascular complications:    Denies: CKD, retinopathy  Last eye exam: Completed 10/2018   Macrovascular complications:    Denies: CAD, PVD, CVA   HISTORY:  Past Medical History:  Past Medical History:  Diagnosis Date   Asthma    Chronic rhinitis around 1986   Diabetes mellitus without complication (HCC)    Past Surgical History:  Past Surgical History:  Procedure Laterality Date   CESAREAN SECTION  1993, 1997, 1999   VEIN SURGERY  2012   Social History:  reports that she has never  smoked. She has never used smokeless tobacco. She reports that she does not drink alcohol and does not use drugs. Family History:  Family History  Problem Relation Age of Onset   Diabetes Mother    Hypertension Mother    Allergic rhinitis Father    Allergic rhinitis Sister    Asthma Sister    Urticaria Sister    Diabetes Maternal Grandmother    Hypertension Maternal Grandmother      HOME MEDICATIONS: Allergies as of 08/11/2021       Reactions   Aspirin Swelling   Swelling of the face         Medication List        Accurate as of August 11, 2021  7:04 AM. If you have any questions, ask your nurse or doctor.          atorvastatin 20 MG tablet Commonly known as: LIPITOR Take 1 tablet (20 mg total) by mouth daily.   clopidogrel 75 MG tablet Commonly known as: PLAVIX Take 1 tablet (75 mg total) by mouth daily.   fluticasone 50 MCG/ACT nasal spray Commonly known as: FLONASE Place 1 spray into both nostrils daily as needed. What changed: reasons to take this   fluticasone 50 MCG/ACT nasal spray Commonly known as: FLONASE Place 1 spray into the nose as needed for allergies. What changed: Another medication with the same name was changed. Make sure you understand how and when to take each.  HumaLOG Mix 75/25 KwikPen (75-25) 100 UNIT/ML Kwikpen Generic drug: Insulin Lispro Prot & Lispro INJECT 10 UNITS INTO THE SKIN TWICE DAILY WITH A MEAL What changed: See the new instructions.   metoprolol tartrate 50 MG tablet Commonly known as: LOPRESSOR Take 1 tablet (50 mg total) by mouth once for 1 dose. 2 hours before ct   montelukast 10 MG tablet Commonly known as: SINGULAIR Take 1 tablet (10 mg total) by mouth at bedtime.   nitroGLYCERIN 0.4 MG SL tablet Commonly known as: NITROSTAT Place 1 tablet (0.4 mg total) under the tongue every 5 (five) minutes as needed.   ProAir RespiClick 108 (90 Base) MCG/ACT Aepb Generic drug: Albuterol Sulfate Inhale 1-2 puffs  into the lungs every 4 (four) hours as needed (for cough or wheeze).   Symbicort 160-4.5 MCG/ACT inhaler Generic drug: budesonide-formoterol INHALE 2 PUFFS INTO THE LUNGS TWICE DAILY   Trulicity 1.5 MG/0.5ML Sopn Generic drug: Dulaglutide Inject 1.5 mg into the skin once a week.   venlafaxine XR 150 MG 24 hr capsule Commonly known as: EFFEXOR-XR Take 1 capsule (150 mg total) by mouth daily with breakfast.         OBJECTIVE:   Vital Signs: There were no vitals taken for this visit.  Wt Readings from Last 3 Encounters:  07/15/21 179 lb (81.2 kg)  05/19/21 176 lb (79.8 kg)  03/30/21 181 lb 9.6 oz (82.4 kg)     Exam: General: Pt appears well and is in NAD  Lungs: Clear with good BS bilat with no rales, rhonchi, or wheezes  Heart: RRR  Abdomen: Normoactive bowel sounds, soft, nontender, without masses or organomegaly palpable  Extremities: No pretibial edema.   Neuro: MS is good with appropriate affect, pt is alert and Ox3    DM foot exam:  03/30/2021  The skin of the feet is intact without sores or ulcerations. The pedal pulses are 2+ on right and 2+ on left. The sensation is intact to a screening 5.07, 10 gram monofilament bilaterally        DATA REVIEWED:  Lab Results  Component Value Date   HGBA1C 8.3 (H) 03/30/2021   HGBA1C 7.8 (H) 11/04/2020   HGBA1C 9.1 (A) 07/19/2020   Lab Results  Component Value Date   MICROALBUR <0.7 11/04/2020   LDLCALC 119 (H) 03/30/2021   CREATININE 0.84 07/22/2021   Lab Results  Component Value Date   MICRALBCREAT 3.6 11/04/2020     Lab Results  Component Value Date   CHOL 218 (H) 03/30/2021   HDL 77.60 03/30/2021   LDLCALC 119 (H) 03/30/2021   LDLDIRECT 120.0 08/06/2017   TRIG 106.0 03/30/2021   CHOLHDL 3 03/30/2021       Results for DEMICA, ZOOK (MRN [id number]) as of 02/06/2020 13:08   Ref. Range 12/19/2019 13:32  Glutamic Acid Decarb Ab Latest Ref Range: <5 IU/mL >250 (H)    ASSESSMENT / PLAN /  RECOMMENDATIONS:   1) Latent Autoimmune  Diabetes Mellitus (LADA), Poorly  controlled  , Without complications - Most recent A1c of  8.4 %. Goal A1c < 7.0 %.      - Tolerating trilicity without side effects - Intolerant to Metformin due to severe nausea  - She is working with her insurance on obtaining Dexcom, she will let me know if they continue to refuse coverage, then will prescribe freestyle libre  - We also discussed insulin pump options ( Omnipod and T-Slim) - She understands the current insulin regimen is not optimal but its  BID dosing which is helpful to her  - She had hypoglycemia , that she attributes to eating a light dinner followed by strenuous exercise. She was advised to consume a protein bar should ber BG < 150 mg/dL just before exercise.  -A1c has increased to 8.4%, a portal message has been sent to the patient to switch insulin to multiple daily injections of insulin, in the past she has been reluctant to do this due to frequent  of injections, but it will give her much better control as the current regimen lacks flexibility  MEDICATIONS: - Humalog Mix  (75/25) 8 units with Breakfast and continue 8 units with Supper  - Continue  Trulicity 1.5 mg weekly    EDUCATION / INSTRUCTIONS: BG monitoring instructions: Patient is instructed to check her blood sugars 2 times a day, before breakfast and supper. Call  Endocrinology clinic if: BG persistently < 70  I reviewed the Rule of 15 for the treatment of hypoglycemia in detail with the patient. Literature supplied.    2) Diabetic complications:  Eye: Does not have known diabetic retinopathy.  Neuro/ Feet: Does not have known diabetic peripheral neuropathy .  Renal: Patient does not have known baseline CKD. She   is not on an ACEI/ARB at present.    3) Dyslipidemia:  - LDL above goal , will stop lovastatin and start atorvastatin 20 mg daily  Medication Atorvastatin 20 mg daily     F/U in 4 months     Signed electronically by: Lyndle Herrlich, MD  [facility] Endocrinology  [facility] [address] [place] [place], [place] [address] Phone: [phone] FAX: [phone]   CC: [name], MD [address] [place] [address] Phone: [phone]  Fax: [phone]  Return to Endocrinology clinic as below: Future Appointments  Date Time Provider Department Center  08/11/2021  7:30 AM Delisha Peaden, Konrad Dolores, MD [facility] None  09/14/2021  9:40 AM Georgeanna Lea, MD [facility] None

## 2021-08-11 NOTE — Telephone Encounter (Signed)
Junell called in and states that she had to move to [facility] for a while to take care of her husband.  Saja states she had an asthma attack and she needs PRO-AIR Respiclick sent to [facility].

## 2021-08-11 NOTE — Addendum Note (Signed)
Addended by: Robet Leu A on: 08/11/2021 10:21 AM   Modules accepted: Orders

## 2021-08-16 IMAGING — MG DIGITAL SCREENING BILAT W/ TOMO W/ CAD
6 of 12 series · 6 of 36 positions shown · non-contrast
Comparison: Previous exam(s).

ACR Breast Density Category a: The breast tissue is almost entirely
fatty.

CLINICAL DATA: Screening.

EXAM:
DIGITAL SCREENING BILATERAL MAMMOGRAM WITH TOMO AND CAD

[L CC synth-2D]
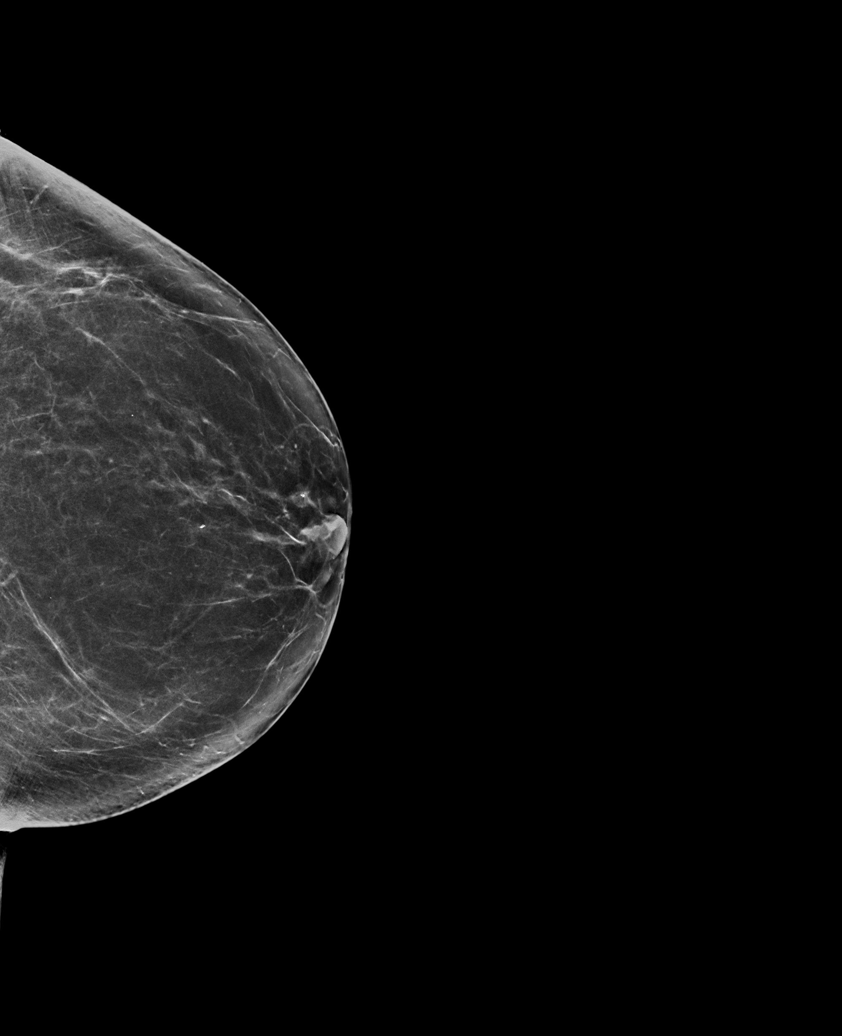

[R MLO synth-2D (1 of 2)]
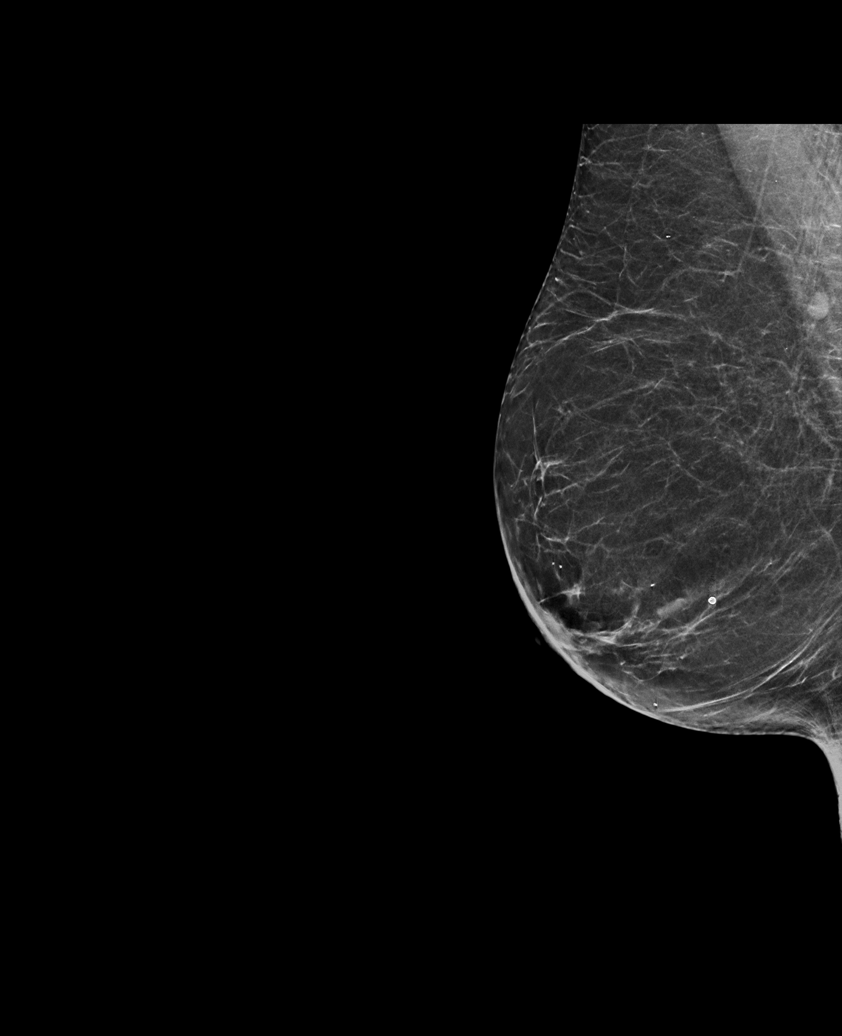

[R XCCL synth-2D]
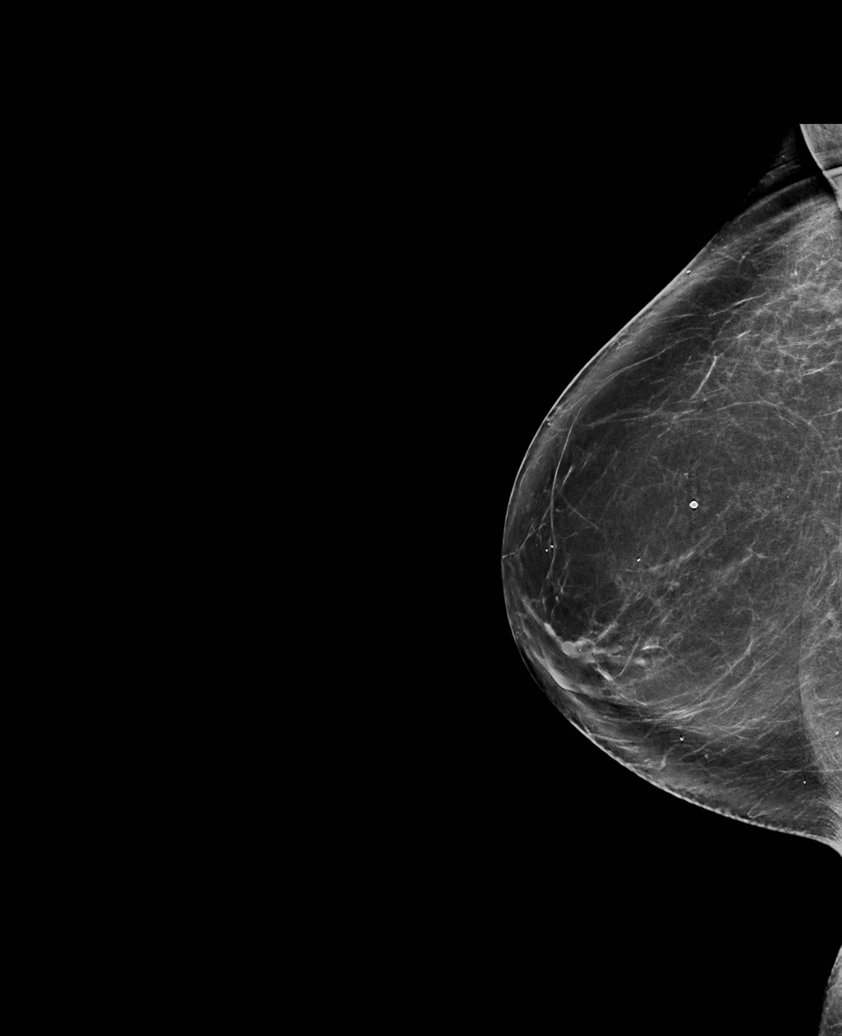

[R MLO synth-2D (2 of 2)]
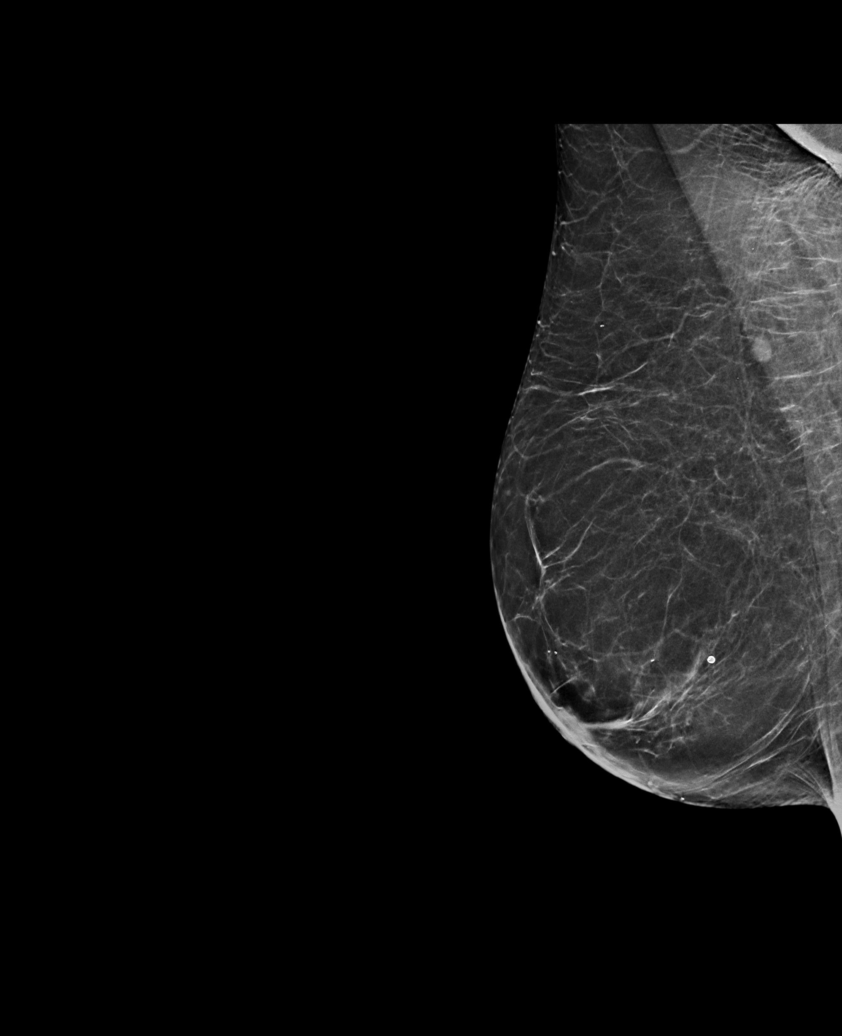

[L MLO synth-2D]
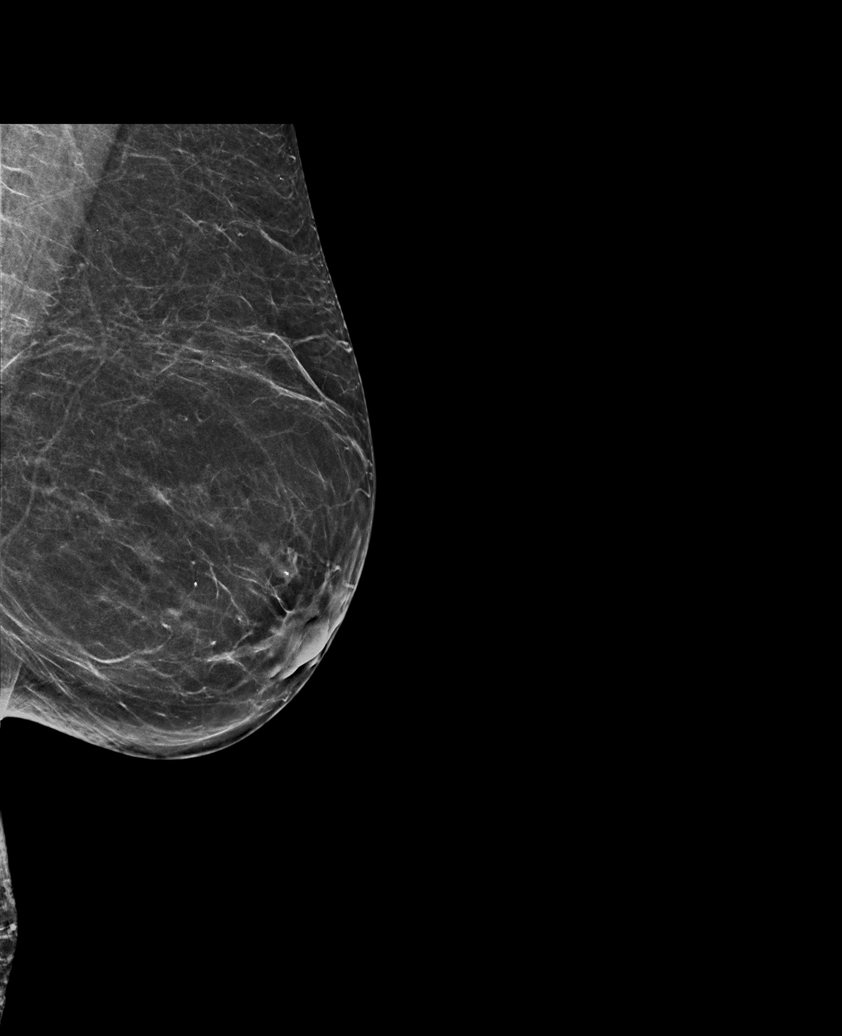

[R CC synth-2D]
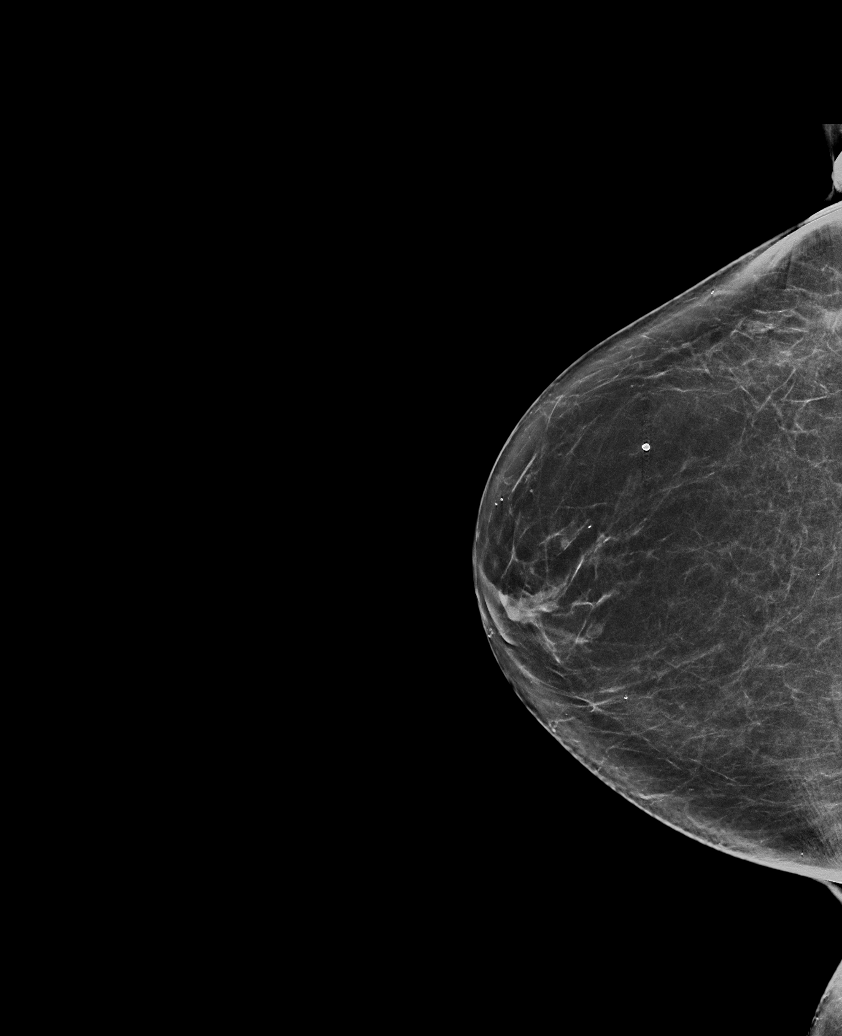

[6 of 36 positions shown; findings below may reference images not displayed]

FINDINGS: There are no findings suspicious for malignancy. Images were
processed with CAD.
IMPRESSION: No mammographic evidence of malignancy. A result letter of this
screening mammogram will be mailed directly to the patient.

RECOMMENDATION:
Screening mammogram in one year. (Code:[id number]-[id number])

BI-RADS CATEGORY  1: Negative.

## 2021-08-31 DIAGNOSIS — E785 Hyperlipidemia, unspecified: Secondary | ICD-10-CM | POA: Insufficient documentation

## 2021-08-31 DIAGNOSIS — E559 Vitamin D deficiency, unspecified: Secondary | ICD-10-CM

## 2021-08-31 HISTORY — DX: Hyperlipidemia, unspecified: E78.5

## 2021-08-31 HISTORY — DX: Vitamin D deficiency, unspecified: E55.9

## 2021-09-06 NOTE — Progress Notes (Signed)
FOLLOW UP Date of Service/Encounter:  09/07/21   Subjective:  Sierra Lyons (DOB: 27-Mar-1961) is a 60 y.o. female who [facility] in re-evaluation of the following: moderate persistent asthma and allergic rhinitis.   History obtained from: chart review and patient.  For Review, LV was on 09/06/20  with Dr. Nunzio Cobbs seen for moderate persistent asthma (with exacerbation requiring prednisone, singulair, on Symbicort 160 BID, switched to [place]) and allergic rhinitis (flonase PRN).    Today presents for follow-up. Change of weather and fall/winter usually are her most bothersome time of year. When the fall started, her husband ended up getting hospitalized at [facility].  Her asthma started acting up and she was without Breztri.  She has not been using a controller inhaler at all since last winter.  She has now restarted and is using 2 puffs twice a day which she started during her flare back in October.  She is now well controlled again.  Very infrequent use of albuterol.  Typically goes months without needing.  She did have to use it during her flare in October, but otherwise has not needed.  No systemic steroids this year. Otherwise, her allergic rhinitis has been controlled as well.  Allergies as of 09/07/2021       Reactions   Aspirin Swelling   Swelling of the face         Medication List        Accurate as of September 07, 2021 12:37 PM. If you have any questions, ask your nurse or doctor.          atorvastatin 20 MG tablet Commonly known as: LIPITOR Take 1 tablet (20 mg total) by mouth daily.   clopidogrel 75 MG tablet Commonly known as: PLAVIX Take 1 tablet (75 mg total) by mouth daily.   fluticasone 50 MCG/ACT nasal spray Commonly known as: FLONASE Place 1 spray into both nostrils daily as needed. What changed: reasons to take this   fluticasone 50 MCG/ACT nasal spray Commonly known as: FLONASE Place 1 spray  into the nose as needed for allergies. What changed: Another medication with the same name was changed. Make sure you understand how and when to take each.   HumaLOG Mix 75/25 KwikPen (75-25) 100 UNIT/ML Kwikpen Generic drug: Insulin Lispro Prot & Lispro INJECT 10 UNITS INTO THE SKIN TWICE DAILY WITH A MEAL What changed: See the new instructions.   metoprolol tartrate 50 MG tablet Commonly known as: LOPRESSOR Take 1 tablet (50 mg total) by mouth once for 1 dose. 2 hours before ct   montelukast 10 MG tablet Commonly known as: SINGULAIR Take 1 tablet (10 mg total) by mouth at bedtime.   nitroGLYCERIN 0.4 MG SL tablet Commonly known as: NITROSTAT Place 1 tablet (0.4 mg total) under the tongue every 5 (five) minutes as needed.   ProAir RespiClick 108 (90 Base) MCG/ACT Aepb Generic drug: Albuterol Sulfate Inhale 1-2 puffs into the lungs every 4 (four) hours as needed (for cough or wheeze).   Symbicort 160-4.5 MCG/ACT inhaler Generic drug: budesonide-formoterol INHALE 2 PUFFS INTO THE LUNGS TWICE DAILY   Trulicity 1.5 MG/0.5ML Sopn Generic drug: Dulaglutide Inject 1.5 mg into the skin once a week.   venlafaxine XR 150 MG 24 hr capsule Commonly known as: EFFEXOR-XR Take 1 capsule (150 mg total) by mouth daily with breakfast.       Past Medical History:  Diagnosis Date   Asthma    Chronic rhinitis  around 1986   Diabetes mellitus without complication [facility])    Past Surgical History:  Procedure Laterality Date   CESAREAN SECTION  1993, 1997, 1999   VEIN SURGERY  2012   Otherwise, there have been no changes to her past medical history, surgical history, family history, or social history.  ROS: All others negative except as noted per HPI.   Objective:  Pulse 82    Temp 97.6 F (36.4 C)    Resp 18    Ht 5\' 5"  (1.651 m)    Wt 192 lb 3.2 oz (87.2 kg)    SpO2 100%    BMI 31.98 kg/m  Body mass index is 31.98 kg/m. Physical Exam: General Appearance:  Alert, cooperative, no  distress, appears stated age  Head:  Normocephalic, without obvious abnormality, atraumatic  Eyes:  Conjunctiva clear, EOM's intact  Nose: Nares normal, mildly edematous turbinate  Throat: Lips, tongue normal; teeth and gums normal, normal posterior oropharynx  Neck: Supple, symmetrical  Lungs:   Clear to auscultation bilaterally, respirations unlabored, no coughing  Heart:  Regular rate and rhythm, no murmur, appears well perfused  Extremities: No edema  Skin: Skin color, texture, turgor normal, no rashes or lesions on visualized portions of skin  Neurologic: No gross deficits  Spirometry:  Tracings reviewed. Her effort: Good reproducible efforts. FVC: 3.0L FEV1: 2.32L, 88% predicted FEV1/FVC ratio: 97% Interpretation: Spirometry consistent with normal pattern.  Please see scanned spirometry results for details.  Assessment/Plan   Patient Instructions  Moderate persistent asthma-controlled Asthma action plan: Breztri 2 inhalations daily starting a few weeks prior to fall and continuing through winter. For flares increase to 2 inhalations twice daily. Samples provided for [facility]. Use a spacer. Continue montelukast 10 mg daily.  A refill prescription has been provided. Continue albuterol HFA, 1 to 2 inhalations every 4-6 hours if needed.  Allergic rhinitis-controlled Continue appropriate allergen avoidance measures. Montelukast has been prescribed (as above). Continue fluticasone nasal spray, one spray per nostril 1-2 times daily as needed.  Nasal saline spray (i.e., Simply Saline) or nasal saline lavage (i.e., NeilMed) is recommended as needed and prior to medicated nasal sprays.  Follow-up yearly if remains controlled, sooner if needed.   No follow-ups on file.  [facility], MD  [facility]

## 2021-09-07 ENCOUNTER — Other Ambulatory Visit: Payer: Self-pay

## 2021-09-07 ENCOUNTER — Encounter: Payer: Self-pay | Admitting: Family Medicine

## 2021-09-07 ENCOUNTER — Ambulatory Visit (INDEPENDENT_AMBULATORY_CARE_PROVIDER_SITE_OTHER): Payer: 59 | Admitting: Internal Medicine

## 2021-09-07 ENCOUNTER — Encounter: Payer: Self-pay | Admitting: Internal Medicine

## 2021-09-07 VITALS — HR 82 | Temp 97.6°F | Resp 18 | Ht 65.0 in | Wt 192.2 lb

## 2021-09-07 DIAGNOSIS — J3089 Other allergic rhinitis: Secondary | ICD-10-CM | POA: Diagnosis not present

## 2021-09-07 DIAGNOSIS — J4540 Moderate persistent asthma, uncomplicated: Secondary | ICD-10-CM

## 2021-09-07 DIAGNOSIS — L987 Excessive and redundant skin and subcutaneous tissue: Secondary | ICD-10-CM

## 2021-09-07 MED ORDER — MONTELUKAST SODIUM 10 MG PO TABS: 10.0000 mg | ORAL_TABLET | Freq: Every day | ORAL | 1 refills | Status: DC

## 2021-09-07 MED ORDER — FLUTICASONE PROPIONATE 50 MCG/ACT NA SUSP: 1.0000 | Freq: Every day | NASAL | 11 refills | Status: DC | PRN

## 2021-09-07 MED ORDER — BREZTRI AEROSPHERE 160-9-4.8 MCG/ACT IN AERO: 2.0000 | INHALATION_SPRAY | Freq: Two times a day (BID) | RESPIRATORY_TRACT | 11 refills | Status: DC

## 2021-09-07 MED ORDER — ALBUTEROL SULFATE HFA 108 (90 BASE) MCG/ACT IN AERS: 2.0000 | INHALATION_SPRAY | Freq: Four times a day (QID) | RESPIRATORY_TRACT | 2 refills | Status: DC | PRN

## 2021-09-07 NOTE — Patient Instructions (Addendum)
Moderate persistent asthma-controlled Asthma action plan: Breztri 2 inhalations daily starting a few weeks prior to fall and continuing through winter. For flares increase to 2 inhalations twice daily. Samples provided for [facility]. Use a spacer. Continue montelukast 10 mg daily.  A refill prescription has been provided. Continue albuterol HFA, 1 to 2 inhalations every 4-6 hours if needed.  Allergic rhinitis-controlled Continue appropriate allergen avoidance measures. Montelukast has been prescribed (as above). Continue fluticasone nasal spray, one spray per nostril 1-2 times daily as needed.  Nasal saline spray (i.e., Simply Saline) or nasal saline lavage (i.e., NeilMed) is recommended as needed and prior to medicated nasal sprays.  Follow-up yearly if remains controlled, sooner if needed.

## 2021-09-07 NOTE — Addendum Note (Signed)
Addended by: Abbe Amsterdam C on: 09/07/2021 11:06 AM   Modules accepted: Orders

## 2021-09-14 ENCOUNTER — Other Ambulatory Visit: Payer: Self-pay

## 2021-09-14 ENCOUNTER — Ambulatory Visit: Payer: 59 | Admitting: Cardiology

## 2021-09-14 ENCOUNTER — Encounter: Payer: Self-pay | Admitting: Cardiology

## 2021-09-14 VITALS — BP 104/72 | HR 91 | Ht 65.0 in | Wt 189.0 lb

## 2021-09-14 DIAGNOSIS — R0609 Other forms of dyspnea: Secondary | ICD-10-CM | POA: Diagnosis not present

## 2021-09-14 DIAGNOSIS — J4540 Moderate persistent asthma, uncomplicated: Secondary | ICD-10-CM

## 2021-09-14 DIAGNOSIS — R0789 Other chest pain: Secondary | ICD-10-CM

## 2021-09-14 DIAGNOSIS — Q210 Ventricular septal defect: Secondary | ICD-10-CM | POA: Insufficient documentation

## 2021-09-14 DIAGNOSIS — E785 Hyperlipidemia, unspecified: Secondary | ICD-10-CM

## 2021-09-14 HISTORY — DX: Ventricular septal defect: Q21.0

## 2021-09-14 NOTE — Addendum Note (Signed)
Addended by: Heywood Bene on: 09/14/2021 04:46 PM   Modules accepted: Orders

## 2021-09-14 NOTE — Patient Instructions (Signed)
Medication Instructions:  Your physician recommends that you continue on your current medications as directed. Please refer to the Current Medication list given to you today.  *If you need a refill on your cardiac medications before your next appointment, please call your pharmacy*   Lab Work: Your physician recommends that you have your lipids checked today in the office.  If you have labs (blood work) drawn today and your tests are completely normal, you will receive your results only by: MyChart Message (if you have MyChart) OR A paper copy in the mail If you have any lab test that is abnormal or we need to change your treatment, we will call you to review the results.   Testing/Procedures: None ordered   Follow-Up: At [facility], you and your health needs are our priority.  As part of our continuing mission to provide you with exceptional heart care, we have created designated Provider Care Teams.  These Care Teams include your primary Cardiologist (physician) and Advanced Practice Providers (APPs -  Physician Assistants and Nurse Practitioners) who all work together to provide you with the care you need, when you need it.  We recommend signing up for the patient portal called "MyChart".  Sign up information is provided on this After Visit Summary.  MyChart is used to connect with patients for Virtual Visits (Telemedicine).  Patients are able to view lab/test results, encounter notes, upcoming appointments, etc.  Non-urgent messages can be sent to your provider as well.   To learn more about what you can do with MyChart, go to [email or web address].    Your next appointment:   6 month(s)  The format for your next appointment:   In Person  Provider:   Gypsy Balsam, MD   Other Instructions NA

## 2021-09-14 NOTE — Addendum Note (Signed)
Addended by: Eleonore Chiquito on: 09/14/2021 10:22 AM   Modules accepted: Orders

## 2021-09-14 NOTE — Progress Notes (Signed)
Cardiology Office Note:    Date:  09/14/2021   ID:  Sierra Lyons, DOB 05/21/1961, MRN [id number]  PCP:  [name], MD  Cardiologist:  Gypsy Balsam, MD    Referring MD: [name], MD   Chief Complaint  Patient presents with   Follow-up  Am doing fine I spent night in the hospital at my hospital.  Apparently her husband got cirrhosis of the liver was called to [facility] for potential transplant however it did not happen.  History of Present Illness:    Sierra Lyons is a 60 y.o. female who was referred to [place] because of atypical chest pain multiple risk factors for coronary artery disease.  Coronary CT angio has been performed which showed nonobstructive disease but surprisingly we did find out small VSD.  Interestingly on physical exam I do not hear any murmur so this is really very tiny.  She also got dyslipidemia type 1 diabetes which is followed by endocrinology.  She is coming today for follow-up.  Overall seems to be doing better.  Again tired and exhausted today about spending night at [facility] with her husband.  Past Medical History:  Diagnosis Date   Asthma    Chronic rhinitis around 1986   Diabetes mellitus without complication (HCC)     Past Surgical History:  Procedure Laterality Date   CESAREAN SECTION  1993, 1997, 1999   VEIN SURGERY  2012    Current Medications: Current Meds  Medication Sig   albuterol (VENTOLIN HFA) 108 (90 Base) MCG/ACT inhaler Inhale 2 puffs into the lungs every 6 (six) hours as needed for wheezing or shortness of breath.   Albuterol Sulfate (PROAIR RESPICLICK) 108 (90 Base) MCG/ACT AEPB Inhale 1-2 puffs into the lungs every 4 (four) hours as needed (for cough or wheeze).   atorvastatin (LIPITOR) 20 MG tablet Take 1 tablet (20 mg total) by mouth daily.   Budeson-Glycopyrrol-Formoterol (BREZTRI AEROSPHERE) 160-9-4.8 MCG/ACT AERO Inhale 2 puffs into the lungs in the morning and at bedtime.   clopidogrel (PLAVIX) 75 MG tablet  Take 1 tablet (75 mg total) by mouth daily.   Dulaglutide (TRULICITY) 1.5 MG/0.5ML SOPN Inject 1.5 mg into the skin once a week.   fluticasone (FLONASE) 50 MCG/ACT nasal spray Place 1 spray into the nose as needed for allergies.   fluticasone (FLONASE) 50 MCG/ACT nasal spray Place 1 spray into both nostrils daily as needed for allergies.   insulin degludec (TRESIBA FLEXTOUCH) 100 UNIT/ML FlexTouch Pen Inject 20 Units into the skin daily.   insulin lispro protamine-lispro (HUMALOG 75/25 MIX) (75-25) 100 UNIT/ML SUSP injection Inject 6 Units into the skin in the morning, at noon, and at bedtime.   montelukast (SINGULAIR) 10 MG tablet Take 1 tablet (10 mg total) by mouth at bedtime.   nitroGLYCERIN (NITROSTAT) 0.4 MG SL tablet Place 0.4 mg under the tongue every 5 (five) minutes as needed for chest pain.   venlafaxine XR (EFFEXOR-XR) 150 MG 24 hr capsule Take 1 capsule (150 mg total) by mouth daily with breakfast.   [DISCONTINUED] HUMALOG MIX 75/25 KWIKPEN (75-25) 100 UNIT/ML Kwikpen INJECT 10 UNITS INTO THE SKIN TWICE DAILY WITH A MEAL (Patient taking differently: Inject 10 Units into the skin in the morning and at bedtime.)   [DISCONTINUED] nitroGLYCERIN (NITROSTAT) 0.4 MG SL tablet Place 1 tablet (0.4 mg total) under the tongue every 5 (five) minutes as needed. (Patient taking differently: Place 0.4 mg under the tongue every 5 (five) minutes as needed for chest pain.)  Allergies:   Aspirin   Social History   Socioeconomic History   Marital status: Married    Spouse name: Not on file   Number of children: Not on file   Years of education: Not on file   Highest education level: Not on file  Occupational History   Not on file  Tobacco Use   Smoking status: Never   Smokeless tobacco: Never  Vaping Use   Vaping Use: Never used  Substance and Sexual Activity   Alcohol use: No   Drug use: No   Sexual activity: Yes    Birth control/protection: None  Other Topics Concern   Not on file   Social History Narrative   Not on file   Social Determinants of Health   Financial Resource Strain: Not on file  Food Insecurity: Not on file  Transportation Needs: Not on file  Physical Activity: Not on file  Stress: Not on file  Social Connections: Not on file     Family History: The patient's family history includes Allergic rhinitis in her father and sister; Asthma in her sister; Diabetes in her maternal grandmother and mother; Hypertension in her maternal grandmother and mother; Urticaria in her sister. ROS:   Please see the history of present illness.    All 14 point review of systems negative except as described per history of present illness  EKGs/Labs/Other Studies Reviewed:      Recent Labs: 05/19/2021: Hemoglobin 13.8; Platelets 272.0; TSH 1.18 07/22/2021: BUN 18; Creatinine, Ser 0.84; Potassium 4.5; Sodium 143  Recent Lipid Panel    Component Value Date/Time   CHOL 218 (H) 03/30/2021 0820   TRIG 106.0 03/30/2021 0820   HDL 77.60 03/30/2021 0820   CHOLHDL 3 03/30/2021 0820   VLDL 21.2 03/30/2021 0820   LDLCALC 119 (H) 03/30/2021 0820   LDLCALC 114 (H) 05/19/2020 1436   LDLDIRECT 120.0 08/06/2017 1544    Physical Exam:    VS:  BP 104/72 (BP Location: Left Arm, Patient Position: Sitting)    Pulse 91    Ht 5\' 5"  (1.651 m)    Wt 189 lb (85.7 kg)    SpO2 97%    BMI 31.45 kg/m     Wt Readings from Last 3 Encounters:  09/14/21 189 lb (85.7 kg)  09/07/21 192 lb 3.2 oz (87.2 kg)  07/15/21 179 lb (81.2 kg)     GEN:  Well nourished, well developed in no acute distress HEENT: Normal NECK: No JVD; No carotid bruits LYMPHATICS: No lymphadenopathy CARDIAC: RRR, no murmurs, no rubs, no gallops RESPIRATORY:  Clear to auscultation without rales, wheezing or rhonchi  ABDOMEN: Soft, non-tender, non-distended MUSCULOSKELETAL:  No edema; No deformity  SKIN: Warm and dry LOWER EXTREMITIES: no swelling NEUROLOGIC:  Alert and oriented x 3 PSYCHIATRIC:  Normal affect    ASSESSMENT:    1. Moderate persistent asthma without complication   2. VSD (ventricular septal defect), muscular very small pick up on coronary CT angio   3. Dyspnea on exertion   4. Atypical chest pain   5. Dyslipidemia    PLAN:    In order of problems listed above:  VSD which is very tiny.  I did talk to her about endocarditis prophylaxis.  I told her before dental work she is to take 2 g of amoxicillin.  Defect is very small hemodynamically insignificant.  Continue monitoring.  No need to surgically intervene. Dyspnea on exertion.  Echocardiogram showed preserved left ventricle ejection fraction.  Evaluation for coronary artery disease  was unrevealing.  I encouraged her to start exercising on a regular basis and gradually build up stamina. Dyslipidemia she is on statin I will ask her to have fasting lipid profile done. Type 2 diabetes followed by endocrinology.   Medication Adjustments/Labs and Tests Ordered: Current medicines are reviewed at length with the patient today.  Concerns regarding medicines are outlined above.  No orders of the defined types were placed in this encounter.  Medication changes: No orders of the defined types were placed in this encounter.   Signed, Georgeanna Lea, MD, [facility] 09/14/2021 10:15 AM    [facility]

## 2021-09-15 LAB — LIPID PANEL
Chol/HDL Ratio: 2.3 ratio (ref 0.0–4.4)
Cholesterol, Total: 170 mg/dL (ref 100–199)
HDL: 75 mg/dL (ref >39)
LDL Chol Calc (NIH): 76 mg/dL (ref 0–99)
Triglycerides: 108 mg/dL (ref 0–149)
VLDL Cholesterol Cal: 19 mg/dL (ref 5–40)

## 2021-09-25 NOTE — Progress Notes (Signed)
Sierra Lyons at [facility] [address] [place], [place] [address] [phone] [id number]  Date:  09/28/2021   Name:  Sierra Lyons   DOB:  1961-01-05   MRN:  [id number]  PCP:  Darreld Mclean, MD    Chief Complaint: Back Pain (Pt c/o left lower back pain for a week. Taking 3 of the 200 mg Ibu. And some pain above the left leg- this has resolved.)   History of Present Illness:  Sierra Lyons is a 61 y.o. very pleasant female patient who presents with the following:  Pt seen today with concern of back and abd pain Last seen by myself in August   History of hyperlipidemia, asthma and allergies, LADA type diabetes She does have endocrinology care   She had covid over the summer  She was seen [facility] at [facility] on 12/29 for this same issue:  Assessment/Plan  Diagnoses and all orders for this visit: Left-sided low back pain without sciatica, unspecified chronicity 1. Apply warm alternating with cold moist compresses every 2 hours for 20 minutes. Performed leg lifts with compresses. 2. Take Ibuprofen every 8 hours for 2 days. 3. Prednisone for the next 5 days. 4. Follow up in person if symptoms do not improve with Urgent Care or PCP. 5. Patient agree with plan of care.  Pneumonia vaccine- give today  Covid booster- not done yet, encouraged her to complete Flu vaccine- done in November Eye exam- done about 6 weeks ago   She had noted some left anterior hip pain about a week ago- this resolved after about 1 day and then she noted pain in her left lower back She feels like the pain got worse yesterday Not going down her leg, no numbness or weakness of the leg No change in her continence status NKI As above, she did a virtual visit as above a week ago but she did not take the prednisone-she was not sure if it was necessary and was concerned about worsening of her diabetes. She is currently seeing endocrinology, using a continuous blood  glucose monitor.  She notes that her blood sugars have been under much better control recently She is using ibuprofen 600 every 8 hours  She notes her husband has cirrhosis, he is currently on the liver transplant list.  Patient Active Problem List   Diagnosis Date Noted   VSD (ventricular septal defect), muscular very small pick up on coronary CT angio 09/14/2021   Hyperlipidemia 08/31/2021   Vitamin D deficiency 08/31/2021   Osteopenia 08/01/2021   Atypical chest pain 07/15/2021   Dyspnea on exertion 07/15/2021   Diabetes mellitus without complication ([place]) 123XX123   Chronic rhinitis 07/11/2021   Type 1 diabetes mellitus with hyperglycemia ([place]) 07/20/2020   Latent autoimmune diabetes in adults (LADA), managed as type 1 ([place]) 02/06/2020   Dyslipidemia 02/06/2020   Overweight 01/05/2016   Moderate persistent asthma 09/07/2015   Allergic rhinitis 08/10/2015    Past Medical History:  Diagnosis Date   Asthma    Chronic rhinitis around 1986   Diabetes mellitus without complication ([place])     Past Surgical History:  Procedure Laterality Date   [place]  2012    Social History   Tobacco Use   Smoking status: Never   Smokeless tobacco: Never  Vaping Use   Vaping Use: Never used  Substance Use Topics   Alcohol use: No   Drug use:  No    Family History  Problem Relation Age of Onset   Diabetes Mother    Hypertension Mother    Allergic rhinitis Father    Allergic rhinitis Sister    Asthma Sister    Urticaria Sister    Diabetes Maternal Grandmother    Hypertension Maternal Grandmother     Allergies  Allergen Reactions   Aspirin Swelling    Swelling of the face     Medication list has been reviewed and updated.  Current Outpatient Medications on File Prior to Visit  Medication Sig Dispense Refill   albuterol (VENTOLIN HFA) 108 (90 Base) MCG/ACT inhaler Inhale 2 puffs into the lungs every 6 (six) hours as needed for  wheezing or shortness of breath. 8 g 2   Albuterol Sulfate (PROAIR RESPICLICK) 123XX123 (90 Base) MCG/ACT AEPB Inhale 1-2 puffs into the lungs every 4 (four) hours as needed (for cough or wheeze). 1 each 0   atorvastatin (LIPITOR) 20 MG tablet Take 1 tablet (20 mg total) by mouth daily. 90 tablet 3   Budeson-Glycopyrrol-Formoterol (BREZTRI AEROSPHERE) 160-9-4.8 MCG/ACT AERO Inhale 2 puffs into the lungs in the morning and at bedtime. 10.7 g 11   clopidogrel (PLAVIX) 75 MG tablet Take 1 tablet (75 mg total) by mouth daily. 90 tablet 1   Dulaglutide (TRULICITY) 1.5 0000000 SOPN Inject 1.5 mg into the skin once a week. 6 mL 3   fluticasone (FLONASE) 50 MCG/ACT nasal spray Place 1 spray into the nose as needed for allergies.     fluticasone (FLONASE) 50 MCG/ACT nasal spray Place 1 spray into both nostrils daily as needed for allergies. 16 g 11   insulin degludec (TRESIBA FLEXTOUCH) 100 UNIT/ML FlexTouch Pen Inject 20 Units into the skin daily.     insulin lispro protamine-lispro (HUMALOG 75/25 MIX) (75-25) 100 UNIT/ML SUSP injection Inject 6 Units into the skin in the morning, at noon, and at bedtime.     montelukast (SINGULAIR) 10 MG tablet Take 1 tablet (10 mg total) by mouth at bedtime. 90 tablet 1   nitroGLYCERIN (NITROSTAT) 0.4 MG SL tablet Place 0.4 mg under the tongue every 5 (five) minutes as needed for chest pain.     venlafaxine XR (EFFEXOR-XR) 150 MG 24 hr capsule Take 1 capsule (150 mg total) by mouth daily with breakfast. 5 capsule 0   No current facility-administered medications on file prior to visit.    Review of Systems:  As per HPI- otherwise negative.   Physical Examination: Vitals:   09/28/21 0811  BP: 110/62  Pulse: 62  Resp: 18  Temp: 98.2 F (36.8 C)  SpO2: 98%   Vitals:   09/28/21 0811  Weight: 187 lb 6.4 oz (85 kg)  Height: 5\' 5"  (1.651 m)   Body mass index is 31.18 kg/m. Ideal Body Weight: Weight in (lb) to have BMI = 25: 149.9  GEN: no acute distress.  Mild  obesity, looks well HEENT: Atraumatic, Normocephalic.  Ears and Nose: No external deformity. CV: RRR, No M/G/R. No JVD. No thrill. No extra heart sounds. PULM: CTA B, no wheezes, crackles, rhonchi. No retractions. No resp. distress. No accessory muscle use. ABD: S, NT, ND. No rebound. No HSM.  Belly is benign, no tenderness on exam today EXTR: No c/c/e PSYCH: Normally interactive. Conversant.  Patient has mild reproducible tenderness when I press over the left SI joint.  Normal thoracolumbar flexion extension, normal toe raise Left hip displays normal range of motion without pain Bilateral lower extremity: Normal strength,  sensation, DTR.  Negative straight leg raise  Assessment and Plan: Acute left-sided low back pain without sciatica - Plan: methocarbamol (ROBAXIN) 500 MG tablet  Immunization due - Plan: Pneumococcal conjugate vaccine 20-valent (Prevnar 20)  Patient seen today with concern of lower back pain.  She has had pain in her left lower back for about 1 week, she did have some tenderness in her hip which has now resolved She is currently using ibuprofen We discussed use of prednisone, I agree with deferring this since her symptoms are not that severe.  Prednisone use is likely to severely disrupt her diabetes control Prescribed Robaxin use as needed, caution regarding sedation She declines x-rays today, if her symptoms do not resolve in about 1 week she will contact me and I will order  Update pneumonia vaccine, recommended COVID-19 vaccine  Signed Lamar Blinks, MD

## 2021-09-25 NOTE — Patient Instructions (Addendum)
Good to see you again today   Okay to continue use of ibuprofen, use Robaxin as needed for muscle spasm.  This medication can make you drowsy so be cautious.  If your back is not feeling better in 1 to 2 weeks please let me know and I will order x-rays for you.  You also got your pneumonia vaccine today.  Would recommend the updated COVID booster at your earliest convenience

## 2021-09-28 ENCOUNTER — Ambulatory Visit: Payer: 59 | Admitting: Family Medicine

## 2021-09-28 VITALS — BP 110/62 | HR 62 | Temp 98.2°F | Resp 18 | Ht 65.0 in | Wt 187.4 lb

## 2021-09-28 DIAGNOSIS — M545 Low back pain, unspecified: Secondary | ICD-10-CM | POA: Diagnosis not present

## 2021-09-28 DIAGNOSIS — Z23 Encounter for immunization: Secondary | ICD-10-CM | POA: Diagnosis not present

## 2021-09-28 MED ORDER — METHOCARBAMOL 500 MG PO TABS: 500.0000 mg | ORAL_TABLET | Freq: Three times a day (TID) | ORAL | 0 refills | Status: DC | PRN

## 2021-10-07 ENCOUNTER — Institutional Professional Consult (permissible substitution): Payer: 59 | Admitting: Plastic Surgery

## 2021-10-15 ENCOUNTER — Other Ambulatory Visit: Payer: Self-pay | Admitting: Family Medicine

## 2021-10-15 DIAGNOSIS — F419 Anxiety disorder, unspecified: Secondary | ICD-10-CM

## 2021-10-15 DIAGNOSIS — F32A Depression, unspecified: Secondary | ICD-10-CM

## 2021-10-19 ENCOUNTER — Other Ambulatory Visit: Payer: Self-pay

## 2021-10-19 MED ORDER — MONTELUKAST SODIUM 10 MG PO TABS: 10.0000 mg | ORAL_TABLET | Freq: Every day | ORAL | 1 refills | Status: DC

## 2021-10-19 MED ORDER — BREZTRI AEROSPHERE 160-9-4.8 MCG/ACT IN AERO: INHALATION_SPRAY | RESPIRATORY_TRACT | 5 refills | Status: DC

## 2021-10-19 MED ORDER — FLUTICASONE PROPIONATE 50 MCG/ACT NA SUSP: 1.0000 | NASAL | 5 refills | Status: DC | PRN

## 2021-10-19 MED ORDER — PROAIR RESPICLICK 108 (90 BASE) MCG/ACT IN AEPB: 1.0000 | INHALATION_SPRAY | RESPIRATORY_TRACT | 1 refills | Status: DC | PRN

## 2021-10-19 NOTE — Telephone Encounter (Signed)
Called and spoke to the pharmacist to have her change Yasmin's proair repiclick to name brand ventolin hfa because proair is no longer being made.

## 2021-10-19 NOTE — Telephone Encounter (Signed)
Refilled proair respiclick,fluticasone, breztri, and montelukast 10 mg sent to [facility] [address]

## 2021-10-26 ENCOUNTER — Other Ambulatory Visit (HOSPITAL_COMMUNITY): Payer: Self-pay

## 2021-11-03 ENCOUNTER — Other Ambulatory Visit (HOSPITAL_COMMUNITY): Payer: Self-pay

## 2021-11-14 ENCOUNTER — Telehealth: Payer: Self-pay | Admitting: Pharmacy Technician

## 2021-11-14 NOTE — Telephone Encounter (Signed)
Patient Advocate Encounter  Received notification from [employer] ([employer]) that prior authorization for TRULICITY 1.5MG  is required.   PA submitted on 2.20.23 Key [id number] Status is pending   [facility] will continue to follow  Ricke Hey, CPhT Patient Advocate  Endocrinology Phone: [phone] Fax:  [phone]

## 2021-11-18 NOTE — Telephone Encounter (Signed)
Received notification from [employer] ([employer]) regarding a prior authorization for TRULICITY 1.5MG . Authorization has been APPROVED from 2.20.23 to 2.20.24.    Authorization # [id number]

## 2021-12-07 ENCOUNTER — Other Ambulatory Visit: Payer: Self-pay | Admitting: Internal Medicine

## 2021-12-07 ENCOUNTER — Other Ambulatory Visit (HOSPITAL_COMMUNITY): Payer: Self-pay

## 2022-01-18 ENCOUNTER — Other Ambulatory Visit: Payer: Self-pay | Admitting: Cardiology

## 2022-01-18 ENCOUNTER — Other Ambulatory Visit: Payer: Self-pay | Admitting: Family Medicine

## 2022-01-18 DIAGNOSIS — F419 Anxiety disorder, unspecified: Secondary | ICD-10-CM

## 2022-01-25 ENCOUNTER — Ambulatory Visit: Payer: 59 | Admitting: Cardiology

## 2022-01-25 ENCOUNTER — Encounter: Payer: Self-pay | Admitting: Cardiology

## 2022-01-25 VITALS — BP 100/68 | HR 83 | Ht 65.0 in | Wt 183.8 lb

## 2022-01-25 DIAGNOSIS — Q210 Ventricular septal defect: Secondary | ICD-10-CM

## 2022-01-25 DIAGNOSIS — E1065 Type 1 diabetes mellitus with hyperglycemia: Secondary | ICD-10-CM | POA: Diagnosis not present

## 2022-01-25 DIAGNOSIS — J4540 Moderate persistent asthma, uncomplicated: Secondary | ICD-10-CM | POA: Diagnosis not present

## 2022-01-25 DIAGNOSIS — E785 Hyperlipidemia, unspecified: Secondary | ICD-10-CM

## 2022-01-25 NOTE — Progress Notes (Signed)
?Cardiology Office Note:   ? ?Date:  01/25/2022  ? ?ID:  Sierra Lyons, DOB 08/20/1961, MRN [id number] ? ?PCP:  Pearline Cablesopland, Jessica C, MD  ?Cardiologist:  Gypsy Balsamobert Kenner Lewan, MD   ? ?Referring MD: Pearline Cablesopland, Jessica C, MD  ? ?Chief Complaint  ?Patient presents with  ? Follow-up  ?  fatigue  ? ? ?History of Present Illness:   ? ?Sierra Lyons is a 61 y.o. female with past medical history significant for atypical chest pain, multiple risk factors for coronary artery disease, coronary CT angio has been performed showing nonobstructive disease however at the same time surprisingly she was found to have very small VSD interesting on physical exam absolutely no heart murmur.  She is doing overall well.  She also got diabetes and dyslipidemia as well as asthma. ?Comes to my office for regular follow-up.  Overall doing well.  Denies have any chest pain tightness squeezing pressure burning chest she complain of being fatigued and tired but overall she says she is feeling better and she thinks this is related to the fact that she is more active in the spring and summer she describes situation when she cut some grass and she did quite well. ? ?Past Medical History:  ?Diagnosis Date  ? Asthma   ? Chronic rhinitis around 1986  ? Diabetes mellitus without complication (HCC)   ? ? ?Past Surgical History:  ?Procedure Laterality Date  ? CESAREAN SECTION  1993, 1997, 1999  ? VEIN SURGERY  2012  ? ? ?Current Medications: ?Current Meds  ?Medication Sig  ? albuterol (VENTOLIN HFA) 108 (90 Base) MCG/ACT inhaler Inhale 2 puffs into the lungs every 6 (six) hours as needed for wheezing or shortness of breath.  ? Albuterol Sulfate (PROAIR RESPICLICK) 108 (90 Base) MCG/ACT AEPB Inhale 1-2 puffs into the lungs every 4 (four) hours as needed (for cough or wheeze).  ? atorvastatin (LIPITOR) 20 MG tablet Take 1 tablet (20 mg total) by mouth daily.  ? BD PEN NEEDLE NANO 2ND GEN 32G X 4 MM MISC USE TWICE DAILY (Patient taking differently: 1 each by  Other route as needed (BGlucose check).)  ? Budeson-Glycopyrrol-Formoterol (BREZTRI AEROSPHERE) 160-9-4.8 MCG/ACT AERO 2 puffs twice daily with spacer to prevent coughing or wheezing (Patient taking differently: Inhale 2 puffs into the lungs daily. 2 puffs twice daily with spacer to prevent coughing or wheezing)  ? clopidogrel (PLAVIX) 75 MG tablet Take 1 tablet (75 mg total) by mouth daily.  ? fluticasone (FLONASE) 50 MCG/ACT nasal spray Place 1 spray into both nostrils daily as needed for allergies.  ? fluticasone (FLONASE) 50 MCG/ACT nasal spray Place 1 spray into both nostrils as needed for allergies.  ? insulin degludec (TRESIBA FLEXTOUCH) 100 UNIT/ML FlexTouch Pen Inject 20 Units into the skin daily.  ? insulin lispro protamine-lispro (HUMALOG 75/25 MIX) (75-25) 100 UNIT/ML SUSP injection Inject 6 Units into the skin in the morning, at noon, and at bedtime.  ? methocarbamol (ROBAXIN) 500 MG tablet Take 1 tablet (500 mg total) by mouth every 8 (eight) hours as needed for muscle spasms.  ? nitroGLYCERIN (NITROSTAT) 0.4 MG SL tablet Place 0.4 mg under the tongue every 5 (five) minutes as needed for chest pain.  ? tirzepatide (MOUNJARO) 7.5 MG/0.5ML Pen Inject 7.5 mg into the skin once a week.  ? venlafaxine XR (EFFEXOR-XR) 150 MG 24 hr capsule TAKE 1 CAPSULE(150 MG) BY MOUTH DAILY WITH BREAKFAST (Patient taking differently: Take 150 mg by mouth daily with breakfast.)  ?  ? ?Allergies:  Aspirin  ? ?Social History  ? ?Socioeconomic History  ? Marital status: Married  ?  Spouse name: Not on file  ? Number of children: Not on file  ? Years of education: Not on file  ? Highest education level: Not on file  ?Occupational History  ? Not on file  ?Tobacco Use  ? Smoking status: Never  ? Smokeless tobacco: Never  ?Vaping Use  ? Vaping Use: Never used  ?Substance and Sexual Activity  ? Alcohol use: No  ? Drug use: No  ? Sexual activity: Yes  ?  Birth control/protection: None  ?Other Topics Concern  ? Not on file  ?Social  History Narrative  ? Not on file  ? ?Social Determinants of Health  ? ?Financial Resource Strain: Not on file  ?Food Insecurity: Not on file  ?Transportation Needs: Not on file  ?Physical Activity: Not on file  ?Stress: Not on file  ?Social Connections: Not on file  ?  ? ?Family History: ?The patient's family history includes Allergic rhinitis in her father and sister; Asthma in her sister; Diabetes in her maternal grandmother and mother; Hypertension in her maternal grandmother and mother; Urticaria in her sister. ?ROS:   ?Please see the history of present illness.    ?All 14 point review of systems negative except as described per history of present illness ? ?EKGs/Labs/Other Studies Reviewed:   ? ? ? ?Recent Labs: ?05/19/2021: Hemoglobin 13.8; Platelets 272.0; TSH 1.18 ?07/22/2021: BUN 18; Creatinine, Ser 0.84; Potassium 4.5; Sodium 143  ?Recent Lipid Panel ?   ?Component Value Date/Time  ? CHOL 170 09/14/2021 1025  ? TRIG 108 09/14/2021 1025  ? HDL 75 09/14/2021 1025  ? CHOLHDL 2.3 09/14/2021 1025  ? CHOLHDL 3 03/30/2021 0820  ? VLDL 21.2 03/30/2021 0820  ? LDLCALC 76 09/14/2021 1025  ? LDLCALC 114 (H) 05/19/2020 1436  ? LDLDIRECT 120.0 08/06/2017 1544  ? ? ?Physical Exam:   ? ?VS:  BP 100/68 (BP Location: Left Arm, Patient Position: Sitting)   Pulse 83   Ht 5\' 5"  (1.651 m)   Wt 183 lb 12.8 oz (83.4 kg)   SpO2 96%   BMI 30.59 kg/m?    ? ?Wt Readings from Last 3 Encounters:  ?01/25/22 183 lb 12.8 oz (83.4 kg)  ?09/28/21 187 lb 6.4 oz (85 kg)  ?09/14/21 189 lb (85.7 kg)  ?  ? ?GEN:  Well nourished, well developed in no acute distress ?HEENT: Normal ?NECK: No JVD; No carotid bruits ?LYMPHATICS: No lymphadenopathy ?CARDIAC: RRR, no murmurs, no rubs, no gallops ?RESPIRATORY:  Clear to auscultation without rales, wheezing or rhonchi  ?ABDOMEN: Soft, non-tender, non-distended ?MUSCULOSKELETAL:  No edema; No deformity  ?SKIN: Warm and dry ?LOWER EXTREMITIES: no swelling ?NEUROLOGIC:  Alert and oriented x  3 ?PSYCHIATRIC:  Normal affect  ? ?ASSESSMENT:   ? ?1. VSD (ventricular septal defect), muscular very small pick up on coronary CT angio   ?2. Moderate persistent asthma without complication   ?3. Type 1 diabetes mellitus with hyperglycemia (HCC)   ?4. Dyslipidemia   ? ?PLAN:   ? ?In order of problems listed above: ? ?Small VSD hemodynamically insignificant, she is aware of endocarditis prophylaxis.  We will continue monitoring. ?Moderate persistent asthma in spite of the fact that this is the pollen season she is doing quite well and overall doing better ?Type 1 diabetes I do not have any recent data last hemoglobin A1c I have is 8.3 this is from last year.  She is being  followed by internal medicine team apparently stable ?Dyslipidemia K PN review data from December showing HDL of 75 LDL 76, she is on Lipitor 20 which I will continue ? ? ?Medication Adjustments/Labs and Tests Ordered: ?Current medicines are reviewed at length with the patient today.  Concerns regarding medicines are outlined above.  ?No orders of the defined types were placed in this encounter. ? ?Medication changes: No orders of the defined types were placed in this encounter. ? ? ?Signed, ?Georgeanna Lea, MD, [facility] ?01/25/2022 1:17 PM    ?[facility] ?

## 2022-01-25 NOTE — Patient Instructions (Signed)

## 2022-02-01 ENCOUNTER — Other Ambulatory Visit: Payer: Self-pay

## 2022-02-01 MED ORDER — BD PEN NEEDLE NANO 2ND GEN 32G X 4 MM MISC: 0 refills | Status: AC

## 2022-04-19 ENCOUNTER — Other Ambulatory Visit: Payer: Self-pay | Admitting: Family Medicine

## 2022-04-19 DIAGNOSIS — F32A Depression, unspecified: Secondary | ICD-10-CM

## 2022-04-19 DIAGNOSIS — F419 Anxiety disorder, unspecified: Secondary | ICD-10-CM

## 2022-06-28 ENCOUNTER — Ambulatory Visit: Payer: 59 | Admitting: Internal Medicine

## 2022-06-28 ENCOUNTER — Encounter: Payer: Self-pay | Admitting: Internal Medicine

## 2022-06-28 VITALS — BP 128/70 | HR 114 | Temp 98.6°F | Resp 16 | Wt 170.0 lb

## 2022-06-28 DIAGNOSIS — J3089 Other allergic rhinitis: Secondary | ICD-10-CM

## 2022-06-28 DIAGNOSIS — J4541 Moderate persistent asthma with (acute) exacerbation: Secondary | ICD-10-CM | POA: Diagnosis not present

## 2022-06-28 MED ORDER — MONTELUKAST SODIUM 10 MG PO TABS: 10.0000 mg | ORAL_TABLET | Freq: Every day | ORAL | 5 refills | Status: DC

## 2022-06-28 MED ORDER — FLOVENT HFA 110 MCG/ACT IN AERO: 2.0000 | INHALATION_SPRAY | Freq: Two times a day (BID) | RESPIRATORY_TRACT | 5 refills | Status: DC

## 2022-06-28 NOTE — Progress Notes (Signed)
FOLLOW UP Date of Service/Encounter:  06/28/22   Subjective:  Sierra Lyons (DOB: 1960/12/16) is a 61 y.o. female who returns to the [facility] on 06/28/2022 in re-evaluation of the following: : moderate persistent asthma and allergic rhinitis.   History obtained from: chart review and patient.  For Review, LV was on 09/07/21  with Dr.Iran Kievit seen for routine follow-up.  Her asthma was well controlled.  She did have a flare in October when her husband was hospitalized at [facility] when she did not have her controller inhaler.  However, since restarting controller inhaler now controlled and not requiring systemic steroids in the past 12 months.  FEV1 88%.  Allergic rhinitis controlled with Flonase and Singulair. ------ Pertinent history/diagnostics: - Asthma-previously uncontrolled on Symbicort 160 switched to [employer].  Fall and winter her worst seasons.  No systemic steroids in the past 12 months.  Does have a small VSD picked up on coronary CT angio.  Today presents for follow-up. She feels her asthma is getting worse since her last visit.  She feels she has been progressively going down hill over the past year. Every day she is using her rescue inhaler, often more than once due to shortness of breath.  She does feel the albuterol somewhat effective.  She is coughing which sounds wet.  She has been coughing over a month.  Coughing comes and goes.  No antibiotics since last visit.  She did have COVID last fall and feels since this she has had more increasing symptoms. She is using Breztri 2 puffs twice day.  Her cough is much worse at night.  No steroids this year. She did have a cardiac evaluation was found to have a small VSD.  No intervention planned at this time.  Allergies as of 06/28/2022       Reactions   Aspirin Swelling   Swelling of the face         Medication List        Accurate as of June 28, 2022  6:45 PM. If you have any questions, ask your nurse  or doctor.          albuterol 108 (90 Base) MCG/ACT inhaler Commonly known as: VENTOLIN HFA Inhale 2 puffs into the lungs every 6 (six) hours as needed for wheezing or shortness of breath.   ProAir RespiClick 562 (90 Base) MCG/ACT Aepb Generic drug: Albuterol Sulfate Inhale 1-2 puffs into the lungs every 4 (four) hours as needed (for cough or wheeze).   atorvastatin 20 MG tablet Commonly known as: LIPITOR Take 1 tablet (20 mg total) by mouth daily.   BD Pen Needle Nano 2nd Gen 32G X 4 MM Misc Generic drug: Insulin Pen Needle 2 times daily   Breztri Aerosphere 160-9-4.8 MCG/ACT Aero Generic drug: Budeson-Glycopyrrol-Formoterol 2 puffs twice daily with spacer to prevent coughing or wheezing What changed:  how much to take how to take this when to take this   clopidogrel 75 MG tablet Commonly known as: PLAVIX Take 1 tablet (75 mg total) by mouth daily.   fluticasone 50 MCG/ACT nasal spray Commonly known as: FLONASE Place 1 spray into both nostrils daily as needed for allergies.   fluticasone 50 MCG/ACT nasal spray Commonly known as: FLONASE Place 1 spray into both nostrils as needed for allergies.   insulin lispro protamine-lispro (75-25) 100 UNIT/ML Susp injection Commonly known as: HUMALOG 75/25 MIX Inject 6 Units into the skin in the morning, at noon, and at bedtime.  methocarbamol 500 MG tablet Commonly known as: Robaxin Take 1 tablet (500 mg total) by mouth every 8 (eight) hours as needed for muscle spasms.   Mounjaro 7.5 MG/0.5ML Pen Generic drug: tirzepatide Inject 7.5 mg into the skin once a week.   nitroGLYCERIN 0.4 MG SL tablet Commonly known as: NITROSTAT Place 0.4 mg under the tongue every 5 (five) minutes as needed for chest pain.   Sierra Lyons FlexTouch 100 UNIT/ML FlexTouch Pen Generic drug: insulin degludec Inject 20 Units into the skin daily.   venlafaxine XR 150 MG 24 hr capsule Commonly known as: EFFEXOR-XR TAKE 1 CAPSULE(150 MG) BY MOUTH  DAILY WITH BREAKFAST       Past Medical History:  Diagnosis Date   Asthma    Chronic rhinitis around 1986   Diabetes mellitus without complication [facility])    Past Surgical History:  Procedure Laterality Date   CESAREAN SECTION  1993, 1997, 1999   VEIN SURGERY  2012   Otherwise, there have been no changes to her past medical history, surgical history, family history, or social history.  ROS: All others negative except as noted per HPI.   Objective:  BP 128/70   Pulse (!) 114   Temp 98.6 F (37 C) (Temporal)   Resp 16   Wt 170 lb (77.1 kg)   SpO2 96%   BMI 28.29 kg/m  Body mass index is 28.29 kg/m. Physical Exam: General Appearance:  Alert, cooperative, no distress, appears stated age  Head:  Normocephalic, without obvious abnormality, atraumatic  Eyes:  Conjunctiva clear, EOM's intact  Nose: Nares normal, hypertrophic turbinates, normal mucosa, no visible anterior polyps, and septum midline  Throat: Lips, tongue normal; teeth and gums normal, normal posterior oropharynx  Neck: Supple, symmetrical  Lungs:   clear to auscultation bilaterally, Respirations unlabored, no coughing  Heart:  regular rate and rhythm and no murmur, Appears well perfused  Extremities: No edema  Skin: Skin color, texture, turgor normal, no rashes or lesions on visualized portions of skin  Neurologic: No gross deficits    Spirometry:  Tracings reviewed. Her effort: Good reproducible efforts. FVC: 2.52L FEV1: 1.77L, 66% predicted FEV1/FVC ratio: 90% Interpretation: Spirometry consistent with possible restrictive disease.  Please see scanned spirometry results for details.  Assessment/Plan   Moderate persistent asthma-NOT well-controlled, COVID-19 long-hauler Her spirometry today shows restriction with a lower FEV1 than her previous visit 10 months ago.  Symptoms have progressively worsened since having COVID-19 in fall 2022.  Found to have have small VSD on recent CT angio. Asthma action  plan: Breztri 2 inhalations twice daily and add-on Flovent 110, 2 inhalations twice daily  Use a spacer. Rinse mouth after use Labs today to determine candidacy for asthma biologic (injectable asthma medication) Continue montelukast 10 mg daily.  A refill prescription has been provided. Continue albuterol HFA, 1 to 2 inhalations every 4-6 hours if needed.  Allergic rhinitis-not well-controlled Continue appropriate allergen avoidance measures. Daily antihistamine-Your options include: Zyrtec (cetirizine) 10 mg, Claritin (loratadine) 10 mg, Xyzal (levocetirizine) 5 mg or Allegra (fexofenadine) 180 mg daily as needed Montelukast as above Continue fluticasone nasal spray, one spray per nostril 1-2 times daily as needed.  Nasal saline spray (i.e., Simply Saline) or nasal saline lavage (i.e., NeilMed) is recommended as needed and prior to medicated nasal sprays.  Follow-up in 4 weeks, sooner if needed.  Tonny Bollman, MD  [facility]

## 2022-06-28 NOTE — Patient Instructions (Addendum)
Moderate persistent asthma-NOT well-controlled Asthma action plan: Breztri 2 inhalations twice daily and add-on Flovent 110, 2 inhalations twice daily  Use a spacer. Rinse mouth after use Labs today to determine candidacy for asthma biologic (injectable asthma medication) Continue montelukast 10 mg daily.  A refill prescription has been provided. Continue albuterol HFA, 1 to 2 inhalations every 4-6 hours if needed.  Allergic rhinitis-not well-controlled Continue appropriate allergen avoidance measures. Daily antihistamine-Your options include: Zyrtec (cetirizine) 10 mg, Claritin (loratadine) 10 mg, Xyzal (levocetirizine) 5 mg or Allegra (fexofenadine) 180 mg daily as needed Montelukast as above Continue fluticasone nasal spray, one spray per nostril 1-2 times daily as needed.  Nasal saline spray (i.e., Simply Saline) or nasal saline lavage (i.e., NeilMed) is recommended as needed and prior to medicated nasal sprays.  Follow-up in 4 weeks, sooner if needed.  It was wonderful meeting you today! Thank you for letting me participate in your care. Sigurd Sos, MD [facility]

## 2022-06-29 ENCOUNTER — Telehealth: Payer: Self-pay | Admitting: Internal Medicine

## 2022-06-29 NOTE — Telephone Encounter (Signed)
Spoke to patient and went over lab results with her and informed her I will be sending out information on both [place] for Asthma.   Sierra Lyons [phone]

## 2022-06-29 NOTE — Telephone Encounter (Signed)
Patient was returning a nurse call about labs. [phone]

## 2022-06-29 NOTE — Progress Notes (Signed)
Please call patient and let her know that her eosinophil count which are the inflammatory cells that often drive asthma are significantly elevated. I believe she would benefit from an injectable asthma medication that targets these cells.  Our two options would be 1. Nucala-once a month injection 2. Fasenra-once a month for three months, then once every two months  These medications work similarly and are both very effective. We can mail her hand-out of both medications and discuss at her upcoming visit.   Let me know if questions.

## 2022-07-01 LAB — CBC WITH DIFFERENTIAL/PLATELET
Basophils Absolute: 0.1 10*3/uL (ref 0.0–0.2)
Basos: 2 %
EOS (ABSOLUTE): 1.2 10*3/uL — ABNORMAL HIGH (ref 0.0–0.4)
Eos: 17 %
Hematocrit: 40.5 % (ref 34.0–46.6)
Hemoglobin: 13.7 g/dL (ref 11.1–15.9)
Immature Grans (Abs): 0 10*3/uL (ref 0.0–0.1)
Immature Granulocytes: 0 %
Lymphocytes Absolute: 1.6 10*3/uL (ref 0.7–3.1)
Lymphs: 23 %
MCH: 31.2 pg (ref 26.6–33.0)
MCHC: 33.8 g/dL (ref 31.5–35.7)
MCV: 92 fL (ref 79–97)
Monocytes Absolute: 0.5 10*3/uL (ref 0.1–0.9)
Monocytes: 7 %
Neutrophils Absolute: 3.7 10*3/uL (ref 1.4–7.0)
Neutrophils: 51 %
Platelets: 265 10*3/uL (ref 150–450)
RBC: 4.39 x10E6/uL (ref 3.77–5.28)
RDW: 13.4 % (ref 11.7–15.4)
WBC: 7.1 10*3/uL (ref 3.4–10.8)

## 2022-07-01 LAB — ALLERGENS W/TOTAL IGE AREA 2
Alternaria Alternata IgE: <0.1 kU/L
Aspergillus Fumigatus IgE: <0.1 kU/L
Bermuda Grass IgE: <0.1 kU/L
Cat Dander IgE: 1.03 kU/L — AB
Cedar, Mountain IgE: <0.1 kU/L
Cladosporium Herbarum IgE: <0.1 kU/L
Cockroach, German IgE: <0.1 kU/L
Common Silver Birch IgE: <0.1 kU/L
Cottonwood IgE: <0.1 kU/L
D Farinae IgE: 14.9 kU/L — AB
D Pteronyssinus IgE: 7.11 kU/L — AB
Dog Dander IgE: 0.3 kU/L — AB
Elm, American IgE: <0.1 kU/L
IgE (Immunoglobulin E), Serum: 53 IU/mL (ref 6–495)
Johnson Grass IgE: <0.1 kU/L
Maple/Box Elder IgE: <0.1 kU/L
Mouse Urine IgE: <0.1 kU/L
Oak, White IgE: <0.1 kU/L
Pecan, Hickory IgE: <0.1 kU/L
Penicillium Chrysogen IgE: <0.1 kU/L
Pigweed, Rough IgE: <0.1 kU/L
Ragweed, Short IgE: <0.1 kU/L
Sheep Sorrel IgE Qn: <0.1 kU/L
Timothy Grass IgE: <0.1 kU/L
White Mulberry IgE: <0.1 kU/L

## 2022-07-03 NOTE — Progress Notes (Signed)
Please update Merian that her allergy testing to environmental allergens was positive to dust mites, cat, and dog.  Please mail out allergen avoidance towards dust mites and pet danders to her home.

## 2022-07-04 LAB — HM DIABETES EYE EXAM

## 2022-07-06 ENCOUNTER — Encounter: Payer: Self-pay | Admitting: Family Medicine

## 2022-07-24 ENCOUNTER — Other Ambulatory Visit: Payer: Self-pay | Admitting: Family Medicine

## 2022-07-24 DIAGNOSIS — F32A Depression, unspecified: Secondary | ICD-10-CM

## 2022-07-25 NOTE — Progress Notes (Signed)
FOLLOW UP Date of Service/Encounter:  07/26/22   Subjective:  Sierra Lyons (DOB: 1960-09-27) is a 61 y.o. female with PMHx of insulin-dependent diabetic [facility] in re-evaluation of the following: moderate persistent asthma and allergic rhinitis.   History obtained from: chart review and patient.  For Review, LV was on 06/28/22  with Dr.Beth Spackman seen for routine follow-up. Reported worsening of asthma symptoms. Using rescue more than normal since having COVID fall 2022. FEV1 66%  Today presents for follow-up. She reports significant improvement since her last visit.  She is using her Breztri followed by Flovent twice daily as instructed.  With this regimen she has not required her albuterol rescue inhaler at all.  Her cough has completely resolved.  She is interested in moving forward with an injectable asthma medication given her elevated eosinophils and current need for high-dose steroids. She is taking her montelukast and using Flonase as instructed.  She takes a Zyrtec as needed.  ----- Pertinent history/diagnostics: Asthma -previously uncontrolled on Symbicort 160 switched to [place].  Fall and winter her worst seasons.  No systemic steroids in the past 12 months.  Does have a small VSD picked up on coronary CT angio. No planned intervention. - biologics labs: AEC 1,200 (06/28/22), total IgE 53 Allergic rhinitis:  - IgE environmental panel 2023: + dust mites, cat and dog  Allergies as of 07/26/2022       Reactions   Aspirin Swelling   Swelling of the face         Medication List        Accurate as of July 26, 2022  1:17 PM. If you have any questions, ask your nurse or doctor.          STOP taking these medications    methocarbamol 500 MG tablet Commonly known as: Robaxin Stopped by: Verlee Monte, MD       TAKE these medications    albuterol 108 (90 Base) MCG/ACT inhaler Commonly known as: VENTOLIN  HFA Inhale 2 puffs into the lungs every 6 (six) hours as needed for wheezing or shortness of breath.   ProAir RespiClick 108 (90 Base) MCG/ACT Aepb Generic drug: Albuterol Sulfate Inhale 1-2 puffs into the lungs every 4 (four) hours as needed (for cough or wheeze).   atorvastatin 20 MG tablet Commonly known as: LIPITOR Take 1 tablet (20 mg total) by mouth daily.   BD Pen Needle Nano 2nd Gen 32G X 4 MM Misc Generic drug: Insulin Pen Needle 2 times daily   Breztri Aerosphere 160-9-4.8 MCG/ACT Aero Generic drug: Budeson-Glycopyrrol-Formoterol 2 puffs twice daily with spacer to prevent coughing or wheezing What changed:  how much to take how to take this when to take this   clopidogrel 75 MG tablet Commonly known as: PLAVIX Take 1 tablet (75 mg total) by mouth daily.   Flovent HFA 110 MCG/ACT inhaler Generic drug: fluticasone Inhale 2 puffs into the lungs 2 (two) times daily.   fluticasone 50 MCG/ACT nasal spray Commonly known as: FLONASE Place 1 spray into both nostrils daily as needed for allergies.   fluticasone 50 MCG/ACT nasal spray Commonly known as: FLONASE Place 1 spray into both nostrils as needed for allergies.   insulin lispro protamine-lispro (75-25) 100 UNIT/ML Susp injection Commonly known as: HUMALOG 75/25 MIX Inject 6 Units into the skin in the morning, at noon, and at bedtime.   montelukast 10 MG tablet Commonly known as: Singulair Take 1 tablet (10  mg total) by mouth at bedtime.   Mounjaro 7.5 MG/0.5ML Pen Generic drug: tirzepatide Inject 10 mg into the skin once a week.   Mounjaro 10 MG/0.5ML Pen Generic drug: tirzepatide SMARTSIG:10 Milligram(s) SUB-Q Once a Week   nitroGLYCERIN 0.4 MG SL tablet Commonly known as: NITROSTAT Place 0.4 mg under the tongue every 5 (five) minutes as needed for chest pain.   Evaristo Bury FlexTouch 100 UNIT/ML FlexTouch Pen Generic drug: insulin degludec Inject 20 Units into the skin daily.   venlafaxine XR 150 MG  24 hr capsule Commonly known as: EFFEXOR-XR Take 1 capsule (150 mg total) by mouth daily with breakfast. Need follow up with PCP       Past Medical History:  Diagnosis Date   Asthma    Chronic rhinitis around 1986   Diabetes mellitus without complication [facility])    Past Surgical History:  Procedure Laterality Date   CESAREAN SECTION  1993, 1997, 1999   VEIN SURGERY  2012   Otherwise, there have been no changes to her past medical history, surgical history, family history, or social history.  ROS: All others negative except as noted per HPI.   Objective:  BP 98/66   Pulse 85   Resp 18   SpO2 98%  There is no height or weight on file to calculate BMI. Physical Exam: General Appearance:  Alert, cooperative, no distress, appears stated age  Head:  Normocephalic, without obvious abnormality, atraumatic  Eyes:  Conjunctiva clear, EOM's intact  Nose: Nares normal, hypertrophic turbinates, normal mucosa, no visible anterior polyps, and septum midline  Throat: Lips, tongue normal; teeth and gums normal, normal posterior oropharynx  Neck: Supple, symmetrical  Lungs:   clear to auscultation bilaterally, Respirations unlabored, no coughing  Heart:  regular rate and rhythm and no murmur, Appears well perfused  Extremities: No edema  Skin: Skin color, texture, turgor normal, no rashes or lesions on visualized portions of skin  Neurologic: No gross deficits   Assessment/Plan   Moderate persistent asthma-improved from previous visit Asthma action plan: Breztri 2 inhalations twice daily, stop Flovent Use a spacer. Rinse mouth after use Eosinophils elevated per most recent labs-sample given of Nucala in clinic, which targets these cells-paperwork submitted for insurance approval.  Continue montelukast 10 mg daily.  A refill prescription has been provided. RESCUE-AirSUPRA (use in place of albuterol HFA), 2 inhalations as needed, maximum 12 inhalations per day. Sample given in  clinic.  Allergic rhinitis- controlled Continue appropriate allergen avoidance measures. Daily antihistamine-Your options include: Zyrtec (cetirizine) 10 mg, Claritin (loratadine) 10 mg, Xyzal (levocetirizine) 5 mg or Allegra (fexofenadine) 180 mg daily as needed Montelukast as above Continue fluticasone nasal spray, one spray per nostril 1-2 times daily as needed.  Nasal saline spray (i.e., Simply Saline) or nasal saline lavage (i.e., NeilMed) is recommended as needed and prior to medicated nasal sprays.  Follow-up in 3 months, sooner if needed. You will be receiving phone calls regarding Nucala approval and delivery.  Please make sure to answer these phone calls as not doing so can prolong the approval process  Tonny Bollman, MD  [facility]

## 2022-07-26 ENCOUNTER — Ambulatory Visit: Payer: 59 | Admitting: Internal Medicine

## 2022-07-26 ENCOUNTER — Encounter: Payer: Self-pay | Admitting: Internal Medicine

## 2022-07-26 VITALS — BP 98/66 | HR 85 | Resp 18

## 2022-07-26 DIAGNOSIS — J3089 Other allergic rhinitis: Secondary | ICD-10-CM

## 2022-07-26 DIAGNOSIS — J4540 Moderate persistent asthma, uncomplicated: Secondary | ICD-10-CM | POA: Diagnosis not present

## 2022-07-26 MED ORDER — AIRSUPRA 90-80 MCG/ACT IN AERO: 2.0000 | INHALATION_SPRAY | RESPIRATORY_TRACT | 3 refills | Status: DC | PRN

## 2022-07-26 MED ORDER — MEPOLIZUMAB 100 MG ~~LOC~~ SOLR
100.0000 mg | Freq: Once | SUBCUTANEOUS | Status: AC
  Administered 2022-07-26: 100 mg via SUBCUTANEOUS

## 2022-07-26 NOTE — Patient Instructions (Addendum)
Moderate persistent asthma- Asthma action plan: Breztri 2 inhalations twice daily, stop Flovent Use a spacer. Rinse mouth after use Eosinophils elevated per most recent labs-sample given of Nucala in clinic, which targets these cells-paperwork submitted for insurance approval.  Continue montelukast 10 mg daily.  A refill prescription has been provided. [place] (use in place of albuterol HFA), 2 inhalations as needed, maximum 12 inhalations per day.  Allergic rhinitis-  Continue appropriate allergen avoidance measures. Daily antihistamine-Your options include: Zyrtec (cetirizine) 10 mg, Claritin (loratadine) 10 mg, Xyzal (levocetirizine) 5 mg or Allegra (fexofenadine) 180 mg daily as needed Montelukast as above Continue fluticasone nasal spray, one spray per nostril 1-2 times daily as needed.  Nasal saline spray (i.e., Simply Saline) or nasal saline lavage (i.e., NeilMed) is recommended as needed and prior to medicated nasal sprays.  Follow-up in 3 months, sooner if needed. You will be receiving phone calls regarding Nucala approval and delivery.  Please make sure to answer these phone calls as not doing so can prolong the approval process  It was wonderful seeing you today! Thank you for letting me participate in your care. Sigurd Sos, MD [facility]

## 2022-07-31 ENCOUNTER — Telehealth: Payer: Self-pay | Admitting: *Deleted

## 2022-07-31 NOTE — Telephone Encounter (Signed)
-----   Message from Clemon Chambers, MD sent at 07/26/2022  1:19 PM EDT ----- Can we get her approved for Nucala? Sample given in clinic today.

## 2022-07-31 NOTE — Telephone Encounter (Signed)
Called patient and advised approval, copay card and submit for Nucala autoinjector to [employer]. Instructed on delivery, storage, dosing for Nucala

## 2022-08-02 ENCOUNTER — Other Ambulatory Visit: Payer: Self-pay

## 2022-08-02 MED ORDER — MONTELUKAST SODIUM 10 MG PO TABS: 10.0000 mg | ORAL_TABLET | Freq: Every day | ORAL | 1 refills | Status: DC

## 2022-08-11 MED ORDER — MEPOLIZUMAB 100 MG ~~LOC~~ SOLR
100.0000 mg | SUBCUTANEOUS | Status: DC
  Administered 2022-07-26: 100 mg via SUBCUTANEOUS

## 2022-08-11 NOTE — Addendum Note (Signed)
Addended by: Devoria Glassing on: 08/11/2022 09:57 AM   Modules accepted: Orders

## 2022-09-01 ENCOUNTER — Other Ambulatory Visit (HOSPITAL_BASED_OUTPATIENT_CLINIC_OR_DEPARTMENT_OTHER): Payer: Self-pay | Admitting: Family Medicine

## 2022-09-01 DIAGNOSIS — Z1231 Encounter for screening mammogram for malignant neoplasm of breast: Secondary | ICD-10-CM

## 2022-09-06 ENCOUNTER — Ambulatory Visit (INDEPENDENT_AMBULATORY_CARE_PROVIDER_SITE_OTHER): Payer: 59

## 2022-09-06 ENCOUNTER — Encounter (HOSPITAL_BASED_OUTPATIENT_CLINIC_OR_DEPARTMENT_OTHER): Payer: Self-pay

## 2022-09-06 ENCOUNTER — Ambulatory Visit (HOSPITAL_BASED_OUTPATIENT_CLINIC_OR_DEPARTMENT_OTHER)
Admission: RE | Admit: 2022-09-06 | Discharge: 2022-09-06 | Disposition: A | Discharge status: Home / Self Care | Payer: 59 | Source: Ambulatory Visit | Attending: Family Medicine | Admitting: Family Medicine

## 2022-09-06 DIAGNOSIS — Z23 Encounter for immunization: Secondary | ICD-10-CM

## 2022-09-06 DIAGNOSIS — Z1231 Encounter for screening mammogram for malignant neoplasm of breast: Secondary | ICD-10-CM | POA: Diagnosis not present

## 2022-09-07 LAB — HEPATIC FUNCTION PANEL
ALT: 19 U/L (ref 7–35)
AST: 20 (ref 13–35)
Alkaline Phosphatase: 46 (ref 25–125)
Bilirubin, Total: 0.3

## 2022-09-07 LAB — HEMOGLOBIN A1C: Hemoglobin A1C: 6

## 2022-09-07 LAB — COMPREHENSIVE METABOLIC PANEL: Albumin: 4.1 (ref 3.5–5.0)

## 2022-09-13 ENCOUNTER — Other Ambulatory Visit: Payer: Self-pay

## 2022-09-13 MED ORDER — BREZTRI AEROSPHERE 160-9-4.8 MCG/ACT IN AERO: INHALATION_SPRAY | RESPIRATORY_TRACT | 1 refills | Status: DC

## 2022-09-25 ENCOUNTER — Other Ambulatory Visit: Payer: Self-pay | Admitting: Internal Medicine

## 2022-09-28 ENCOUNTER — Other Ambulatory Visit: Payer: Self-pay | Admitting: *Deleted

## 2022-09-28 MED ORDER — FLUTICASONE PROPIONATE 50 MCG/ACT NA SUSP: NASAL | 5 refills | Status: DC

## 2022-10-02 NOTE — Progress Notes (Unsigned)
[facility] [address] [place], [place] [address] [phone]  Date:  10/05/2022   Name:  Sierra Lyons   DOB:  01-May-1961   MRN:  [id number]  PCP:  Darreld Mclean, MD    Chief Complaint: No chief complaint on file.   History of Present Illness:  Sierra Lyons is a 62 y.o. very pleasant female patient who presents with the following:  Patient seen today to follow-up on iron deficiency Most recent visit with myself was last week due to abdominal and back pain History of hyperlipidemia, asthma and allergies, LADA type diabetes She does have endocrinology care  From our visit last week-  Patient seen today with concern of lower back pain.  She has had pain in her left lower back for about 1 week, she did have some tenderness in her hip which has now resolved She is currently using ibuprofen We discussed use of prednisone, I agree with deferring this since her symptoms are not that severe.  Prednisone use is likely to severely disrupt her diabetes control Prescribed Robaxin use as needed, caution regarding sedation She declines x-rays today, if her symptoms do not resolve in about 1 week she will contact me and I will order  Patient Active Problem List   Diagnosis Date Noted   VSD (ventricular septal defect), muscular very small pick up on coronary CT angio 09/14/2021   Hyperlipidemia 08/31/2021   Vitamin D deficiency 08/31/2021   Osteopenia 08/01/2021   Atypical chest pain 07/15/2021   Dyspnea on exertion 07/15/2021   Diabetes mellitus without complication ([place]) 03/47/4259   Chronic rhinitis 07/11/2021   Type 1 diabetes mellitus with hyperglycemia ([place]) 07/20/2020   Latent autoimmune diabetes in adults (LADA), managed as type 1 ([place]) 02/06/2020   Dyslipidemia 02/06/2020   Overweight 01/05/2016   Moderate persistent asthma 09/07/2015   Allergic rhinitis 08/10/2015    Past Medical History:  Diagnosis  Date   Asthma    Chronic rhinitis around 1986   Diabetes mellitus without complication ([place])     Past Surgical History:  Procedure Laterality Date   CESAREAN SECTION  1993, 1997, [place]  2012    Social History   Tobacco Use   Smoking status: Never   Smokeless tobacco: Never  Vaping Use   Vaping Use: Never used  Substance Use Topics   Alcohol use: No   Drug use: No    Family History  Problem Relation Age of Onset   Diabetes Mother    Hypertension Mother    Allergic rhinitis Father    Allergic rhinitis Sister    Asthma Sister    Urticaria Sister    Diabetes Maternal Grandmother    Hypertension Maternal Grandmother     Allergies  Allergen Reactions   Aspirin Swelling    Swelling of the face     Medication list has been reviewed and updated.  Current Outpatient Medications on File Prior to Visit  Medication Sig Dispense Refill   Albuterol-Budesonide (AIRSUPRA) 90-80 MCG/ACT AERO Inhale 2 Inhalations into the lungs as needed. Maximum 12 puffs per day. 10.7 g 3   atorvastatin (LIPITOR) 20 MG tablet Take 1 tablet (20 mg total) by mouth daily. 90 tablet 3   Budeson-Glycopyrrol-Formoterol (BREZTRI AEROSPHERE) 160-9-4.8 MCG/ACT AERO 2 puffs twice daily with spacer to prevent coughing or wheezing. Rinse, gargle, and spit after use. 3 each 1   clopidogrel (PLAVIX) 75 MG tablet  Take 1 tablet (75 mg total) by mouth daily. 90 tablet 1   FLOVENT HFA 110 MCG/ACT inhaler Inhale 2 puffs into the lungs 2 (two) times daily. 1 each 5   fluticasone (FLONASE) 50 MCG/ACT nasal spray Place 1 spray into both nostrils as needed for allergies. 16 g 5   fluticasone (FLONASE) 50 MCG/ACT nasal spray SHAKE LIQUID AND USE 1 SPRAY IN EACH NOSTRIL DAILY AS NEEDED FOR ALLERGIES 16 g 5   insulin degludec (TRESIBA FLEXTOUCH) 100 UNIT/ML FlexTouch Pen Inject 20 Units into the skin daily.     insulin lispro protamine-lispro (HUMALOG 75/25 MIX) (75-25) 100 UNIT/ML SUSP injection Inject 6  Units into the skin in the morning, at noon, and at bedtime.     Insulin Pen Needle (BD PEN NEEDLE NANO 2ND GEN) 32G X 4 MM MISC 2 times daily 100 each 0   montelukast (SINGULAIR) 10 MG tablet Take 1 tablet (10 mg total) by mouth at bedtime. 90 tablet 1   MOUNJARO 10 MG/0.5ML Pen SMARTSIG:10 Milligram(s) SUB-Q Once a Week     nitroGLYCERIN (NITROSTAT) 0.4 MG SL tablet Place 0.4 mg under the tongue every 5 (five) minutes as needed for chest pain.     tirzepatide (MOUNJARO) 7.5 MG/0.5ML Pen Inject 10 mg into the skin once a week.     venlafaxine XR (EFFEXOR-XR) 150 MG 24 hr capsule Take 1 capsule (150 mg total) by mouth daily with breakfast. Need follow up with PCP 90 capsule 0   Current Facility-Administered Medications on File Prior to Visit  Medication Dose Route Frequency Provider Last Rate Last Admin   mepolizumab (NUCALA) injection 100 mg  100 mg Subcutaneous Q28 days Verlee Monte, MD   100 mg at 07/26/22 0321    Review of Systems:  As per HPI- otherwise negative.   Physical Examination: There were no vitals filed for this visit. There were no vitals filed for this visit. There is no height or weight on file to calculate BMI. Ideal Body Weight:    GEN: no acute distress. HEENT: Atraumatic, Normocephalic.  Ears and Nose: No external deformity. CV: RRR, No M/G/R. No JVD. No thrill. No extra heart sounds. PULM: CTA B, no wheezes, crackles, rhonchi. No retractions. No resp. distress. No accessory muscle use. ABD: S, NT, ND, +BS. No rebound. No HSM. EXTR: No c/c/e PSYCH: Normally interactive. Conversant.    Assessment and Plan: ***  Signed Abbe Amsterdam, MD

## 2022-10-05 ENCOUNTER — Ambulatory Visit: Payer: 59 | Admitting: Family Medicine

## 2022-10-05 ENCOUNTER — Encounter: Payer: Self-pay | Admitting: Family Medicine

## 2022-10-05 VITALS — BP 121/60 | HR 65 | Temp 97.6°F | Resp 18 | Ht 65.0 in

## 2022-10-05 DIAGNOSIS — R5383 Other fatigue: Secondary | ICD-10-CM | POA: Diagnosis not present

## 2022-10-05 DIAGNOSIS — E785 Hyperlipidemia, unspecified: Secondary | ICD-10-CM | POA: Diagnosis not present

## 2022-10-05 LAB — FERRITIN: Ferritin: 123.8 ng/mL (ref 10.0–291.0)

## 2022-10-05 LAB — VITAMIN B12: Vitamin B-12: 788 pg/mL (ref 211–911)

## 2022-10-05 LAB — BASIC METABOLIC PANEL
BUN: 27 mg/dL — ABNORMAL HIGH (ref 6–23)
CO2: 27 mEq/L (ref 19–32)
Calcium: 9.8 mg/dL (ref 8.4–10.5)
Chloride: 104 mEq/L (ref 96–112)
Creatinine, Ser: 0.73 mg/dL (ref 0.40–1.20)
GFR: 90.06 mL/min (ref >60.00)
Glucose, Bld: 94 mg/dL (ref 70–99)
Potassium: 4.7 mEq/L (ref 3.5–5.1)
Sodium: 140 mEq/L (ref 135–145)

## 2022-10-05 LAB — TSH: TSH: 1.04 u[IU]/mL (ref 0.35–5.50)

## 2022-10-05 LAB — VITAMIN D 25 HYDROXY (VIT D DEFICIENCY, FRACTURES): VITD: 27.18 ng/mL — ABNORMAL LOW (ref 30.00–100.00)

## 2022-10-05 MED ORDER — ATORVASTATIN CALCIUM 20 MG PO TABS: 20.0000 mg | ORAL_TABLET | Freq: Every day | ORAL | 3 refills | Status: DC

## 2022-10-05 NOTE — Patient Instructions (Signed)
Good to see you today- I will be in touch with your labs and we will look for any explanation for your fatigue I do think it is important for you to stay on your statin for cholesterol- refilled today

## 2022-10-12 ENCOUNTER — Encounter: Payer: Self-pay | Admitting: *Deleted

## 2022-10-12 ENCOUNTER — Encounter: Payer: Self-pay | Admitting: Family Medicine

## 2022-10-12 DIAGNOSIS — E785 Hyperlipidemia, unspecified: Secondary | ICD-10-CM

## 2022-10-12 MED ORDER — ATORVASTATIN CALCIUM 20 MG PO TABS: 20.0000 mg | ORAL_TABLET | Freq: Every day | ORAL | 3 refills | Status: DC

## 2022-10-27 ENCOUNTER — Other Ambulatory Visit: Payer: Self-pay | Admitting: Family Medicine

## 2022-10-27 DIAGNOSIS — F32A Depression, unspecified: Secondary | ICD-10-CM

## 2022-11-23 LAB — HM COLONOSCOPY

## 2022-12-06 ENCOUNTER — Other Ambulatory Visit: Payer: Self-pay | Admitting: Internal Medicine

## 2023-01-09 ENCOUNTER — Other Ambulatory Visit: Payer: Self-pay

## 2023-01-09 DIAGNOSIS — F32A Depression, unspecified: Secondary | ICD-10-CM

## 2023-01-09 MED ORDER — VENLAFAXINE HCL ER 150 MG PO CP24: 150.0000 mg | ORAL_CAPSULE | Freq: Every day | ORAL | 0 refills | Status: DC

## 2023-01-10 ENCOUNTER — Encounter (INDEPENDENT_AMBULATORY_CARE_PROVIDER_SITE_OTHER): Payer: 59 | Admitting: Family Medicine

## 2023-01-10 DIAGNOSIS — R21 Rash and other nonspecific skin eruption: Secondary | ICD-10-CM | POA: Diagnosis not present

## 2023-01-10 DIAGNOSIS — L987 Excessive and redundant skin and subcutaneous tissue: Secondary | ICD-10-CM

## 2023-01-10 NOTE — Addendum Note (Signed)
Addended by: Abbe Amsterdam C on: 01/10/2023 12:59 PM   Modules accepted: Orders

## 2023-01-10 NOTE — Telephone Encounter (Signed)
Please see the MyChart message reply(ies) for my assessment and plan.  The patient gave consent for this Medical Advice Message and is aware that it may result in a bill to their insurance company as well as the possibility that this may result in a co-payment or deductible. They are an established patient, but are not seeking medical advice exclusively about a problem treated during an in person or video visit in the last 7 days. I did not recommend an in person or video visit within 7 days of my reply.  I spent a total of 10 minutes cumulative time within 7 days through MyChart messaging Naven Giambalvo, MD  

## 2023-01-15 ENCOUNTER — Other Ambulatory Visit: Payer: Self-pay

## 2023-01-17 ENCOUNTER — Emergency Department (HOSPITAL_COMMUNITY)
Admission: EM | Admit: 2023-01-17 | Discharge: 2023-01-18 | Disposition: A | Discharge status: Home / Self Care | Payer: 59 | Attending: Emergency Medicine | Admitting: Emergency Medicine

## 2023-01-17 ENCOUNTER — Encounter (HOSPITAL_COMMUNITY): Payer: Self-pay | Admitting: Emergency Medicine

## 2023-01-17 ENCOUNTER — Emergency Department (HOSPITAL_COMMUNITY): Payer: 59

## 2023-01-17 DIAGNOSIS — Z7951 Long term (current) use of inhaled steroids: Secondary | ICD-10-CM | POA: Diagnosis not present

## 2023-01-17 DIAGNOSIS — E119 Type 2 diabetes mellitus without complications: Secondary | ICD-10-CM | POA: Insufficient documentation

## 2023-01-17 DIAGNOSIS — W01198A Fall on same level from slipping, tripping and stumbling with subsequent striking against other object, initial encounter: Secondary | ICD-10-CM | POA: Diagnosis not present

## 2023-01-17 DIAGNOSIS — J45909 Unspecified asthma, uncomplicated: Secondary | ICD-10-CM | POA: Diagnosis not present

## 2023-01-17 DIAGNOSIS — R0789 Other chest pain: Secondary | ICD-10-CM | POA: Diagnosis not present

## 2023-01-17 DIAGNOSIS — Z794 Long term (current) use of insulin: Secondary | ICD-10-CM | POA: Insufficient documentation

## 2023-01-17 DIAGNOSIS — S82001A Unspecified fracture of right patella, initial encounter for closed fracture: Secondary | ICD-10-CM | POA: Insufficient documentation

## 2023-01-17 DIAGNOSIS — Y9301 Activity, walking, marching and hiking: Secondary | ICD-10-CM | POA: Insufficient documentation

## 2023-01-17 DIAGNOSIS — Y92480 Sidewalk as the place of occurrence of the external cause: Secondary | ICD-10-CM | POA: Diagnosis not present

## 2023-01-17 DIAGNOSIS — S8991XA Unspecified injury of right lower leg, initial encounter: Secondary | ICD-10-CM | POA: Diagnosis present

## 2023-01-17 NOTE — ED Triage Notes (Addendum)
Pt tripped and fell yesterday. Pain in R knee and L ribs. Taking aleve and icing- pain has improved but she wanted to be assessed.

## 2023-01-18 MED ORDER — ACETAMINOPHEN 325 MG PO TABS: 650.0000 mg | ORAL_TABLET | Freq: Once | ORAL | Status: DC

## 2023-01-18 NOTE — ED Provider Notes (Signed)
[place] EMERGENCY DEPARTMENT AT [facility] Provider Note   CSN: [id number] Arrival date & time: 01/17/23  2249     History  Chief Complaint  Patient presents with   Fall   HPI Sierra Lyons is a 62 y.o. female with asthma and diabetes presenting for fall.  Occurred last night.  Patient was walking down the sidewalk and tripped over a crack in the sidewalk.  Denies head injury or loss consciousness.  Landed on her right knee and also hit the left side of her chest.  Now endorsing left-sided chest wall tenderness and right knee pain.  Denies shortness of breath.  States her right knee was initially swollen but swelling has improved.  Still able to ambulate and bear weight on the knee.  Is tender with ambulation.  Has been taking Aleve and icing areas of pain which has helped.  Fall       Home Medications Prior to Admission medications   Medication Sig Start Date End Date Taking? Authorizing Provider  Albuterol-Budesonide (AIRSUPRA) 90-80 MCG/ACT AERO Inhale 2 Inhalations into the lungs as needed. Maximum 12 puffs per day. 07/26/22   Verlee Monte, MD  atorvastatin (LIPITOR) 20 MG tablet Take 1 tablet (20 mg total) by mouth daily. 10/12/22   Copland, [name], MD  Budeson-Glycopyrrol-Formoterol (BREZTRI AEROSPHERE) 160-9-4.8 MCG/ACT AERO 2 puffs twice daily with spacer to prevent coughing or wheezing. Rinse, gargle, and spit after use. 09/13/22   Verlee Monte, MD  FLOVENT HFA 110 MCG/ACT inhaler Inhale 2 puffs into the lungs 2 (two) times daily. 06/28/22   Verlee Monte, MD  fluticasone (FLONASE) 50 MCG/ACT nasal spray SHAKE LIQUID AND USE 1 SPRAY IN EACH NOSTRIL DAILY AS NEEDED FOR ALLERGIES 09/28/22   Verlee Monte, MD  insulin degludec (TRESIBA FLEXTOUCH) 100 UNIT/ML FlexTouch Pen Inject 20 Units into the skin daily.    [provider]  insulin lispro protamine-lispro (HUMALOG 75/25 MIX) (75-25) 100 UNIT/ML SUSP injection Inject 6 Units into the skin in the  morning, at noon, and at bedtime.    [provider]  Insulin Pen Needle (BD PEN NEEDLE NANO 2ND GEN) 32G X 4 MM MISC 2 times daily 02/01/22   Shamleffer, Konrad Dolores, MD  montelukast (SINGULAIR) 10 MG tablet Take 1 tablet (10 mg total) by mouth at bedtime. 08/02/22   Verlee Monte, MD  [facility] 10 MG/0.5ML Pen SMARTSIG:10 Milligram(s) SUB-Q Once a Week 07/06/22   [provider]  venlafaxine XR (EFFEXOR-XR) 150 MG 24 hr capsule Take 1 capsule (150 mg total) by mouth daily with breakfast. 01/09/23   Copland, [name], MD      Allergies    Aspirin    Review of Systems   See HPI for pertinent positives  Physical Exam Updated Vital Signs BP (!) 100/57   Pulse 74   Temp 97.6 F (36.4 C) (Oral)   Resp 16   SpO2 100%  Physical Exam Constitutional:      Appearance: Normal appearance.  HENT:     Head: Normocephalic.     Nose: Nose normal.  Eyes:     Conjunctiva/sclera: Conjunctivae normal.  Pulmonary:     Effort: Pulmonary effort is normal.  Chest:    Musculoskeletal:     Right knee: No swelling, deformity, effusion, erythema or ecchymosis. Normal range of motion. Tenderness present.     Left knee: Normal.     Comments: Tenderness about the anterior aspect of the right knee joint.  Able to fully flex and extend the right knee.  Neurological:     Mental Status: She is alert.  Psychiatric:        Mood and Affect: Mood normal.     ED Results / Procedures / Treatments   Labs (all labs ordered are listed, but only abnormal results are displayed) Labs Reviewed - No data to display  EKG None  Radiology DG Ribs Unilateral W/Chest Left  Result Date: 01/17/2023 CLINICAL DATA:  Fall. EXAM: LEFT RIBS AND CHEST - 3+ VIEW; RIGHT KNEE - COMPLETE 4+ VIEW COMPARISON:  05/19/2021. FINDINGS: Left ribs and chest: No acute displaced fracture or other bone lesions are seen involving the ribs. There is no evidence of pneumothorax or pleural effusion. Both lungs are  clear. Heart size and mediastinal contours are within normal limits. Right knee: There is a nondisplaced fracture of the superior aspect of the patella. No dislocation. No joint effusion is seen. The soft tissues are within normal limits. IMPRESSION: 1. Nondisplaced fracture of the superior aspect of the patella. 2. No evidence of rib fracture. 3. No acute abnormality in the chest. Electronically Signed   By: Thornell Sartorius M.D.   On: 01/17/2023 23:30   DG Knee Complete 4 Views Right  Result Date: 01/17/2023 CLINICAL DATA:  Fall. EXAM: LEFT RIBS AND CHEST - 3+ VIEW; RIGHT KNEE - COMPLETE 4+ VIEW COMPARISON:  05/19/2021. FINDINGS: Left ribs and chest: No acute displaced fracture or other bone lesions are seen involving the ribs. There is no evidence of pneumothorax or pleural effusion. Both lungs are clear. Heart size and mediastinal contours are within normal limits. Right knee: There is a nondisplaced fracture of the superior aspect of the patella. No dislocation. No joint effusion is seen. The soft tissues are within normal limits. IMPRESSION: 1. Nondisplaced fracture of the superior aspect of the patella. 2. No evidence of rib fracture. 3. No acute abnormality in the chest. Electronically Signed   By: Thornell Sartorius M.D.   On: 01/17/2023 23:30    Procedures Procedures    Medications Ordered in ED Medications  acetaminophen (TYLENOL) tablet 650 mg (has no administration in time range)    ED Course/ Medical Decision Making/ A&P                             Medical Decision Making Amount and/or Complexity of Data Reviewed Radiology: ordered.   62 year old female who is well-appearing presenting for mechanical fall.  Exam notable for tenderness about the left lateral chest wall and tenderness on the superior aspect of the right knee joint but otherwise reassuring.  DDx includes rib fracture, pneumo or hemothorax, knee dislocation or fracture, effusion.  X-rays revealed nondisplaced fracture of  the superior aspect of the patella.  Treated pain with Tylenol.  Applied right knee immobilizer and gave patient crutches.  Advised patient to follow-up with Ortho in the outpatient setting.  Vital stable at discharge.        Final Clinical Impression(s) / ED Diagnoses Final diagnoses:  Closed nondisplaced fracture of right patella, unspecified fracture morphology, initial encounter    Rx / DC Orders ED Discharge Orders     None         Gareth Eagle, PA-C 01/18/23 0018    Dione Booze, MD 01/18/23 [id number]

## 2023-01-18 NOTE — Progress Notes (Signed)
Orthopedic Tech Progress Note Patient Details:  Sierra Lyons June 18, 1961 [id number]  Ortho Devices Type of Ortho Device: Knee Immobilizer Ortho Device/Splint Location: RLE Ortho Device/Splint Interventions: Ordered, Application, Adjustment   Post Interventions Patient Tolerated: Well Instructions Provided: Care of device, Adjustment of device  [name] 01/18/2023, 12:54 AM

## 2023-01-18 NOTE — Discharge Instructions (Signed)
Evaluation today revealed that you have a right patellar fracture.  Recommend you follow-up with orthopedics outpatient.  For now recommend that you can maintain the knee immobilizer while ambulating and do not bear weight.  Use crutches for ambulation.  You can take Tylenol and ibuprofen for symptomatic relief.  Can also apply ice while you are resting the knee to help with the swelling and pain.

## 2023-01-25 ENCOUNTER — Other Ambulatory Visit: Payer: Self-pay | Admitting: *Deleted

## 2023-01-25 MED ORDER — FLUTICASONE PROPIONATE 50 MCG/ACT NA SUSP: NASAL | 0 refills | Status: AC

## 2023-02-04 ENCOUNTER — Other Ambulatory Visit: Payer: Self-pay | Admitting: Internal Medicine

## 2023-02-09 ENCOUNTER — Other Ambulatory Visit: Payer: Self-pay | Admitting: Internal Medicine

## 2023-03-10 ENCOUNTER — Emergency Department (HOSPITAL_COMMUNITY): Payer: 59

## 2023-03-10 ENCOUNTER — Encounter (HOSPITAL_COMMUNITY): Payer: Self-pay | Admitting: Emergency Medicine

## 2023-03-10 ENCOUNTER — Inpatient Hospital Stay (HOSPITAL_COMMUNITY)
Admission: EM | Admit: 2023-03-10 | Discharge: 2023-03-14 | DRG: 871 | Disposition: A | Discharge status: Home / Self Care | Payer: 59 | Attending: Internal Medicine | Admitting: Internal Medicine

## 2023-03-10 DIAGNOSIS — R6520 Severe sepsis without septic shock: Secondary | ICD-10-CM | POA: Diagnosis present

## 2023-03-10 DIAGNOSIS — Z886 Allergy status to analgesic agent status: Secondary | ICD-10-CM

## 2023-03-10 DIAGNOSIS — Z8249 Family history of ischemic heart disease and other diseases of the circulatory system: Secondary | ICD-10-CM

## 2023-03-10 DIAGNOSIS — E8809 Other disorders of plasma-protein metabolism, not elsewhere classified: Secondary | ICD-10-CM | POA: Diagnosis present

## 2023-03-10 DIAGNOSIS — Y93H2 Activity, gardening and landscaping: Secondary | ICD-10-CM

## 2023-03-10 DIAGNOSIS — Z7985 Long-term (current) use of injectable non-insulin antidiabetic drugs: Secondary | ICD-10-CM | POA: Diagnosis not present

## 2023-03-10 DIAGNOSIS — R55 Syncope and collapse: Secondary | ICD-10-CM | POA: Diagnosis present

## 2023-03-10 DIAGNOSIS — Z794 Long term (current) use of insulin: Secondary | ICD-10-CM | POA: Diagnosis not present

## 2023-03-10 DIAGNOSIS — J310 Chronic rhinitis: Secondary | ICD-10-CM | POA: Diagnosis present

## 2023-03-10 DIAGNOSIS — J45901 Unspecified asthma with (acute) exacerbation: Secondary | ICD-10-CM

## 2023-03-10 DIAGNOSIS — Z833 Family history of diabetes mellitus: Secondary | ICD-10-CM

## 2023-03-10 DIAGNOSIS — Z1152 Encounter for screening for COVID-19: Secondary | ICD-10-CM

## 2023-03-10 DIAGNOSIS — E872 Acidosis, unspecified: Secondary | ICD-10-CM | POA: Diagnosis present

## 2023-03-10 DIAGNOSIS — Z79899 Other long term (current) drug therapy: Secondary | ICD-10-CM

## 2023-03-10 DIAGNOSIS — J189 Pneumonia, unspecified organism: Secondary | ICD-10-CM | POA: Diagnosis not present

## 2023-03-10 DIAGNOSIS — E785 Hyperlipidemia, unspecified: Secondary | ICD-10-CM | POA: Diagnosis present

## 2023-03-10 DIAGNOSIS — R6521 Severe sepsis with septic shock: Secondary | ICD-10-CM | POA: Diagnosis present

## 2023-03-10 DIAGNOSIS — T6701XA Heatstroke and sunstroke, initial encounter: Secondary | ICD-10-CM | POA: Diagnosis present

## 2023-03-10 DIAGNOSIS — J4541 Moderate persistent asthma with (acute) exacerbation: Secondary | ICD-10-CM | POA: Diagnosis present

## 2023-03-10 DIAGNOSIS — A419 Sepsis, unspecified organism: Principal | ICD-10-CM | POA: Insufficient documentation

## 2023-03-10 DIAGNOSIS — E1065 Type 1 diabetes mellitus with hyperglycemia: Secondary | ICD-10-CM | POA: Diagnosis present

## 2023-03-10 DIAGNOSIS — N179 Acute kidney failure, unspecified: Secondary | ICD-10-CM | POA: Diagnosis present

## 2023-03-10 DIAGNOSIS — E119 Type 2 diabetes mellitus without complications: Secondary | ICD-10-CM

## 2023-03-10 DIAGNOSIS — E876 Hypokalemia: Secondary | ICD-10-CM | POA: Diagnosis present

## 2023-03-10 DIAGNOSIS — F32A Depression, unspecified: Secondary | ICD-10-CM | POA: Diagnosis present

## 2023-03-10 DIAGNOSIS — Z7951 Long term (current) use of inhaled steroids: Secondary | ICD-10-CM

## 2023-03-10 DIAGNOSIS — J4540 Moderate persistent asthma, uncomplicated: Secondary | ICD-10-CM | POA: Diagnosis present

## 2023-03-10 DIAGNOSIS — E860 Dehydration: Secondary | ICD-10-CM | POA: Diagnosis not present

## 2023-03-10 DIAGNOSIS — Z825 Family history of asthma and other chronic lower respiratory diseases: Secondary | ICD-10-CM

## 2023-03-10 DIAGNOSIS — J690 Pneumonitis due to inhalation of food and vomit: Secondary | ICD-10-CM | POA: Diagnosis present

## 2023-03-10 HISTORY — DX: Sepsis, unspecified organism: A41.9

## 2023-03-10 LAB — LACTIC ACID, PLASMA
Lactic Acid, Venous: 3.5 mmol/L (ref 0.5–1.9)
Lactic Acid, Venous: 4.3 mmol/L (ref 0.5–1.9)

## 2023-03-10 LAB — BASIC METABOLIC PANEL
Anion gap: 15 (ref 5–15)
BUN: 13 mg/dL (ref 6–20)
CO2: 21 mmol/L — ABNORMAL LOW (ref 22–32)
Calcium: 8.7 mg/dL — ABNORMAL LOW (ref 8.9–10.3)
Chloride: 101 mmol/L (ref 98–111)
Creatinine, Ser: 0.92 mg/dL (ref 0.44–1.00)
GFR, Estimated: >60 mL/min (ref >60)
Glucose, Bld: 276 mg/dL — ABNORMAL HIGH (ref 70–99)
Potassium: 3.5 mmol/L (ref 3.5–5.1)
Sodium: 137 mmol/L (ref 135–145)

## 2023-03-10 LAB — URINALYSIS, ROUTINE W REFLEX MICROSCOPIC
Bilirubin Urine: NEGATIVE
Glucose, UA: >=500 mg/dL — AB
Hgb urine dipstick: NEGATIVE
Ketones, ur: NEGATIVE mg/dL
Nitrite: NEGATIVE
Protein, ur: NEGATIVE mg/dL
Specific Gravity, Urine: 1.01 (ref 1.005–1.030)
pH: 5.5 (ref 5.0–8.0)

## 2023-03-10 LAB — CBC WITH DIFFERENTIAL/PLATELET
Abs Immature Granulocytes: 0.04 10*3/uL (ref 0.00–0.07)
Basophils Absolute: 0 10*3/uL (ref 0.0–0.1)
Basophils Relative: 0 %
Eosinophils Absolute: 0 10*3/uL (ref 0.0–0.5)
Eosinophils Relative: 1 %
HCT: 39.9 % (ref 36.0–46.0)
Hemoglobin: 13 g/dL (ref 12.0–15.0)
Immature Granulocytes: 1 %
Lymphocytes Relative: 33 %
Lymphs Abs: 2.6 10*3/uL (ref 0.7–4.0)
MCH: 30.4 pg (ref 26.0–34.0)
MCHC: 32.6 g/dL (ref 30.0–36.0)
MCV: 93.2 fL (ref 80.0–100.0)
Monocytes Absolute: 0.3 10*3/uL (ref 0.1–1.0)
Monocytes Relative: 4 %
Neutro Abs: 5 10*3/uL (ref 1.7–7.7)
Neutrophils Relative %: 61 %
Platelets: 230 10*3/uL (ref 150–400)
RBC: 4.28 MIL/uL (ref 3.87–5.11)
RDW: 13.2 % (ref 11.5–15.5)
WBC: 8 10*3/uL (ref 4.0–10.5)
nRBC: 0 % (ref 0.0–0.2)

## 2023-03-10 LAB — COMPREHENSIVE METABOLIC PANEL
ALT: 19 U/L (ref 0–44)
AST: 22 U/L (ref 15–41)
Albumin: 3.3 g/dL — ABNORMAL LOW (ref 3.5–5.0)
Alkaline Phosphatase: 47 U/L (ref 38–126)
Anion gap: 13 (ref 5–15)
BUN: 13 mg/dL (ref 6–20)
CO2: 21 mmol/L — ABNORMAL LOW (ref 22–32)
Calcium: 8.2 mg/dL — ABNORMAL LOW (ref 8.9–10.3)
Chloride: 106 mmol/L (ref 98–111)
Creatinine, Ser: 1.01 mg/dL — ABNORMAL HIGH (ref 0.44–1.00)
GFR, Estimated: >60 mL/min (ref >60)
Glucose, Bld: 222 mg/dL — ABNORMAL HIGH (ref 70–99)
Potassium: 2.7 mmol/L — CL (ref 3.5–5.1)
Sodium: 140 mmol/L (ref 135–145)
Total Bilirubin: 0.5 mg/dL (ref 0.3–1.2)
Total Protein: 5.9 g/dL — ABNORMAL LOW (ref 6.5–8.1)

## 2023-03-10 LAB — HEMOGLOBIN A1C
Hgb A1c MFr Bld: 6.2 % — ABNORMAL HIGH (ref 4.8–5.6)
Mean Plasma Glucose: 131.24 mg/dL

## 2023-03-10 LAB — I-STAT VENOUS BLOOD GAS, ED
Acid-base deficit: 5 mmol/L — ABNORMAL HIGH (ref 0.0–2.0)
Bicarbonate: 22.1 mmol/L (ref 20.0–28.0)
Calcium, Ion: 1.11 mmol/L — ABNORMAL LOW (ref 1.15–1.40)
HCT: 40 % (ref 36.0–46.0)
Hemoglobin: 13.6 g/dL (ref 12.0–15.0)
O2 Saturation: 74 %
Potassium: 2.7 mmol/L — CL (ref 3.5–5.1)
Sodium: 143 mmol/L (ref 135–145)
TCO2: 24 mmol/L (ref 22–32)
pCO2, Ven: 47.9 mmHg (ref 44–60)
pH, Ven: 7.273 (ref 7.25–7.43)
pO2, Ven: 45 mmHg (ref 32–45)

## 2023-03-10 LAB — TROPONIN I (HIGH SENSITIVITY)
Troponin I (High Sensitivity): 4 ng/L (ref <18)
Troponin I (High Sensitivity): 6 ng/L (ref <18)

## 2023-03-10 LAB — URINALYSIS, MICROSCOPIC (REFLEX)

## 2023-03-10 LAB — CBG MONITORING, ED
Glucose-Capillary: 272 mg/dL — ABNORMAL HIGH (ref 70–99)
Glucose-Capillary: 268 mg/dL — ABNORMAL HIGH (ref 70–99)

## 2023-03-10 LAB — LIPASE, BLOOD: Lipase: 46 U/L (ref 11–51)

## 2023-03-10 MED ORDER — LACTATED RINGERS IV BOLUS
500.0000 mL | Freq: Once | INTRAVENOUS | Status: AC
  Administered 2023-03-10: 500 mL via INTRAVENOUS

## 2023-03-10 MED ORDER — LACTATED RINGERS IV BOLUS
1000.0000 mL | Freq: Once | INTRAVENOUS | Status: AC
  Administered 2023-03-10: 1000 mL via INTRAVENOUS

## 2023-03-10 MED ORDER — DIPHENHYDRAMINE HCL 50 MG/ML IJ SOLN
12.5000 mg | Freq: Once | INTRAMUSCULAR | Status: AC
  Administered 2023-03-10: 12.5 mg via INTRAVENOUS
  Filled 2023-03-10: qty 1

## 2023-03-10 MED ORDER — ENOXAPARIN SODIUM 40 MG/0.4ML IJ SOSY
40.0000 mg | PREFILLED_SYRINGE | INTRAMUSCULAR | Status: DC
  Administered 2023-03-10 – 2023-03-13 (×4): 40 mg via SUBCUTANEOUS
  Filled 2023-03-10 (×4): qty 0.4

## 2023-03-10 MED ORDER — POTASSIUM CHLORIDE 10 MEQ/100ML IV SOLN
10.0000 meq | Freq: Once | INTRAVENOUS | Status: AC
  Administered 2023-03-10: 10 meq via INTRAVENOUS
  Filled 2023-03-10: qty 100

## 2023-03-10 MED ORDER — POTASSIUM CHLORIDE IN NACL 20-0.9 MEQ/L-% IV SOLN
INTRAVENOUS | Status: DC
  Filled 2023-03-10 (×4): qty 1000

## 2023-03-10 MED ORDER — MONTELUKAST SODIUM 10 MG PO TABS
10.0000 mg | ORAL_TABLET | Freq: Every day | ORAL | Status: DC
  Administered 2023-03-10 – 2023-03-13 (×4): 10 mg via ORAL
  Filled 2023-03-10 (×4): qty 1

## 2023-03-10 MED ORDER — ONDANSETRON HCL 4 MG/2ML IJ SOLN
4.0000 mg | Freq: Once | INTRAMUSCULAR | Status: AC
  Administered 2023-03-10: 4 mg via INTRAVENOUS
  Filled 2023-03-10: qty 2

## 2023-03-10 MED ORDER — MAGNESIUM OXIDE -MG SUPPLEMENT 400 (240 MG) MG PO TABS
800.0000 mg | ORAL_TABLET | Freq: Once | ORAL | Status: AC
  Administered 2023-03-10: 800 mg via ORAL
  Filled 2023-03-10: qty 2

## 2023-03-10 MED ORDER — PREDNISONE 20 MG PO TABS
40.0000 mg | ORAL_TABLET | Freq: Every day | ORAL | Status: DC
  Administered 2023-03-11 – 2023-03-12 (×2): 40 mg via ORAL
  Filled 2023-03-10 (×2): qty 2

## 2023-03-10 MED ORDER — MEPOLIZUMAB 100 MG ~~LOC~~ SOLR: 100.0000 mg | Freq: Once | SUBCUTANEOUS | Status: DC

## 2023-03-10 MED ORDER — BOOST / RESOURCE BREEZE PO LIQD CUSTOM
1.0000 | Freq: Once | ORAL | Status: AC
  Administered 2023-03-11: 1 via ORAL
  Filled 2023-03-10: qty 1

## 2023-03-10 MED ORDER — METHYLPREDNISOLONE SODIUM SUCC 125 MG IJ SOLR
125.0000 mg | Freq: Once | INTRAMUSCULAR | Status: AC
  Administered 2023-03-10: 125 mg via INTRAVENOUS
  Filled 2023-03-10: qty 2

## 2023-03-10 MED ORDER — ALBUTEROL SULFATE (2.5 MG/3ML) 0.083% IN NEBU: 2.5000 mg | INHALATION_SOLUTION | RESPIRATORY_TRACT | Status: DC | PRN

## 2023-03-10 MED ORDER — POTASSIUM CHLORIDE CRYS ER 20 MEQ PO TBCR
40.0000 meq | EXTENDED_RELEASE_TABLET | Freq: Once | ORAL | Status: AC
  Administered 2023-03-10: 40 meq via ORAL
  Filled 2023-03-10: qty 2

## 2023-03-10 MED ORDER — PROCHLORPERAZINE EDISYLATE 10 MG/2ML IJ SOLN
10.0000 mg | Freq: Once | INTRAMUSCULAR | Status: AC
  Administered 2023-03-10: 10 mg via INTRAVENOUS
  Filled 2023-03-10: qty 2

## 2023-03-10 MED ORDER — INSULIN DEGLUDEC 100 UNIT/ML ~~LOC~~ SOPN: 6.0000 [IU] | PEN_INJECTOR | Freq: Every day | SUBCUTANEOUS | Status: DC

## 2023-03-10 MED ORDER — INSULIN ASPART 100 UNIT/ML IJ SOLN
0.0000 [IU] | Freq: Every day | INTRAMUSCULAR | Status: DC
  Administered 2023-03-10: 3 [IU] via SUBCUTANEOUS
  Administered 2023-03-13: 2 [IU] via SUBCUTANEOUS

## 2023-03-10 MED ORDER — INSULIN ASPART 100 UNIT/ML IJ SOLN
0.0000 [IU] | Freq: Three times a day (TID) | INTRAMUSCULAR | Status: DC
  Administered 2023-03-11: 5 [IU] via SUBCUTANEOUS
  Administered 2023-03-11 – 2023-03-12 (×2): 3 [IU] via SUBCUTANEOUS
  Administered 2023-03-12: 5 [IU] via SUBCUTANEOUS
  Administered 2023-03-12: 2 [IU] via SUBCUTANEOUS
  Administered 2023-03-13 (×2): 8 [IU] via SUBCUTANEOUS

## 2023-03-10 MED ORDER — ATORVASTATIN CALCIUM 10 MG PO TABS
20.0000 mg | ORAL_TABLET | Freq: Every day | ORAL | Status: DC
  Administered 2023-03-10 – 2023-03-14 (×5): 20 mg via ORAL
  Filled 2023-03-10 (×5): qty 2

## 2023-03-10 MED ORDER — SODIUM CHLORIDE 0.9 % IV SOLN
500.0000 mg | INTRAVENOUS | Status: DC
  Administered 2023-03-10 – 2023-03-13 (×4): 500 mg via INTRAVENOUS
  Filled 2023-03-10 (×4): qty 5

## 2023-03-10 MED ORDER — IOHEXOL 350 MG/ML SOLN
75.0000 mL | Freq: Once | INTRAVENOUS | Status: AC | PRN
  Administered 2023-03-10: 75 mL via INTRAVENOUS

## 2023-03-10 MED ORDER — LACTATED RINGERS IV SOLN: INTRAVENOUS | Status: DC

## 2023-03-10 MED ORDER — IPRATROPIUM-ALBUTEROL 0.5-2.5 (3) MG/3ML IN SOLN
9.0000 mL | Freq: Once | RESPIRATORY_TRACT | Status: AC
  Administered 2023-03-10: 9 mL via RESPIRATORY_TRACT
  Filled 2023-03-10: qty 9

## 2023-03-10 MED ORDER — SODIUM CHLORIDE 0.9 % IV SOLN
2.0000 g | INTRAVENOUS | Status: DC
  Administered 2023-03-10 – 2023-03-11 (×2): 2 g via INTRAVENOUS
  Filled 2023-03-10 (×2): qty 20

## 2023-03-10 MED ORDER — INSULIN GLARGINE-YFGN 100 UNIT/ML ~~LOC~~ SOLN
6.0000 [IU] | Freq: Every day | SUBCUTANEOUS | Status: DC
  Administered 2023-03-10 – 2023-03-13 (×4): 6 [IU] via SUBCUTANEOUS
  Filled 2023-03-10 (×5): qty 0.06

## 2023-03-10 MED ORDER — IPRATROPIUM-ALBUTEROL 0.5-2.5 (3) MG/3ML IN SOLN
3.0000 mL | RESPIRATORY_TRACT | Status: AC
  Administered 2023-03-10: 3 mL via RESPIRATORY_TRACT
  Filled 2023-03-10: qty 3

## 2023-03-10 NOTE — ED Provider Notes (Signed)
[place] EMERGENCY DEPARTMENT AT [facility] Provider Note  CSN: [id number] Arrival date & time: 03/10/23 1529  Chief Complaint(s) Loss of Consciousness, Dizziness, and Emesis  HPI Sierra Lyons is a 62 y.o. female with PMH asthma, type 1 diabetes who presents emergency room for evaluation of a syncopal episode.  Patient states that she was gardening for approximately 30 minutes when she started to feel like she was going to pass out.  Patient then suffered a syncopal episode and was found hypotensive and diaphoretic by EMS.  She endorses associated nausea and shortness of breath and was found to be wheezing.  She received 800 cc of normal saline and 1 DuoNeb prior to arrival with improvement of her blood pressures and shortness of breath.  States that she has never passed out before.  Currently here in the emergency department she is endorsing a headache and nausea but denies chest pain, abdominal pain, numbness, tingling, weakness or other systemic or neurologic complaints.   Past Medical History Past Medical History:  Diagnosis Date   Asthma    Chronic rhinitis around 1986   Diabetes mellitus without complication [facility])    Patient Active Problem List   Diagnosis Date Noted   VSD (ventricular septal defect), muscular very small pick up on coronary CT angio 09/14/2021   Hyperlipidemia 08/31/2021   Vitamin D deficiency 08/31/2021   Osteopenia 08/01/2021   Atypical chest pain 07/15/2021   Dyspnea on exertion 07/15/2021   Diabetes mellitus without complication (HCC) 07/11/2021   Chronic rhinitis 07/11/2021   Type 1 diabetes mellitus with hyperglycemia (HCC) 07/20/2020   Latent autoimmune diabetes in adults (LADA), managed as type 1 (HCC) 02/06/2020   Dyslipidemia 02/06/2020   Overweight 01/05/2016   Moderate persistent asthma 09/07/2015   Allergic rhinitis 08/10/2015   Home Medication(s) Prior to Admission medications   Medication Sig Start Date End Date Taking?  Authorizing Provider  Albuterol-Budesonide (AIRSUPRA) 90-80 MCG/ACT AERO Inhale 2 Inhalations into the lungs as needed. Maximum 12 puffs per day. 07/26/22   Verlee Monte, MD  atorvastatin (LIPITOR) 20 MG tablet Take 1 tablet (20 mg total) by mouth daily. 10/12/22   Copland, [name], MD  Budeson-Glycopyrrol-Formoterol (BREZTRI AEROSPHERE) 160-9-4.8 MCG/ACT AERO 2 puffs twice daily with spacer to prevent coughing or wheezing. Rinse, gargle, and spit after use. 09/13/22   Verlee Monte, MD  FLOVENT HFA 110 MCG/ACT inhaler Inhale 2 puffs into the lungs 2 (two) times daily. 06/28/22   Verlee Monte, MD  fluticasone (FLONASE) 50 MCG/ACT nasal spray SHAKE LIQUID AND USE 1 SPRAY IN EACH NOSTRIL DAILY AS NEEDED FOR ALLERGIES 01/25/23   Verlee Monte, MD  insulin degludec (TRESIBA FLEXTOUCH) 100 UNIT/ML FlexTouch Pen Inject 20 Units into the skin daily.    [provider]  insulin lispro protamine-lispro (HUMALOG 75/25 MIX) (75-25) 100 UNIT/ML SUSP injection Inject 6 Units into the skin in the morning, at noon, and at bedtime.    [provider]  Insulin Pen Needle (BD PEN NEEDLE NANO 2ND GEN) 32G X 4 MM MISC 2 times daily 02/01/22   Shamleffer, Konrad Dolores, MD  montelukast (SINGULAIR) 10 MG tablet Take 1 tablet (10 mg total) by mouth at bedtime. 08/02/22   Verlee Monte, MD  [facility] 10 MG/0.5ML Pen SMARTSIG:10 Milligram(s) SUB-Q Once a Week 07/06/22   [provider]  venlafaxine XR (EFFEXOR-XR) 150 MG 24 hr capsule Take 1 capsule (150 mg total) by mouth daily with breakfast. 01/09/23   Copland, Shanda Bumps  C, MD                                                                                                                                    Past Surgical History Past Surgical History:  Procedure Laterality Date   CESAREAN SECTION  1993, 1997, 1999   VEIN SURGERY  2012   Family History Family History  Problem Relation Age of Onset   Diabetes Mother    Hypertension Mother     Allergic rhinitis Father    Allergic rhinitis Sister    Asthma Sister    Urticaria Sister    Diabetes Maternal Grandmother    Hypertension Maternal Grandmother     Social History Social History   Tobacco Use   Smoking status: Never   Smokeless tobacco: Never  Vaping Use   Vaping Use: Never used  Substance Use Topics   Alcohol use: No   Drug use: No   Allergies Aspirin  Review of Systems Review of Systems  Respiratory:  Positive for shortness of breath.   Gastrointestinal:  Positive for nausea.  Neurological:  Positive for syncope.    Physical Exam Vital Signs  I have reviewed the triage vital signs Ht 5\' 5"  (1.651 m)   Wt 70.3 kg   BMI 25.79 kg/m   Physical Exam Vitals and nursing note reviewed.  Constitutional:      General: She is not in acute distress.    Appearance: She is well-developed. She is diaphoretic.  HENT:     Head: Normocephalic and atraumatic.  Eyes:     Conjunctiva/sclera: Conjunctivae normal.  Cardiovascular:     Rate and Rhythm: Normal rate and regular rhythm.     Heart sounds: No murmur heard. Pulmonary:     Effort: Pulmonary effort is normal. No respiratory distress.     Breath sounds: Wheezing present.  Abdominal:     Palpations: Abdomen is soft.     Tenderness: There is no abdominal tenderness.  Musculoskeletal:        General: No swelling.     Cervical back: Neck supple.  Skin:    General: Skin is warm.     Capillary Refill: Capillary refill takes less than 2 seconds.  Neurological:     Mental Status: She is alert.  Psychiatric:        Mood and Affect: Mood normal.     ED Results and Treatments Labs (all labs ordered are listed, but only abnormal results are displayed) Labs Reviewed  CBC WITH DIFFERENTIAL/PLATELET  COMPREHENSIVE METABOLIC PANEL  LIPASE, BLOOD  URINALYSIS, ROUTINE W REFLEX MICROSCOPIC  BLOOD GAS, VENOUS  LACTIC ACID, PLASMA  LACTIC ACID, PLASMA  TROPONIN I (HIGH SENSITIVITY)  Radiology No results found.  Pertinent labs & imaging results that were available during my care of the patient were reviewed by me and considered in my medical decision making (see MDM for details).  Medications Ordered in ED Medications  ipratropium-albuterol (DUONEB) 0.5-2.5 (3) MG/3ML nebulizer solution 9 mL (has no administration in time range)  lactated ringers bolus 1,000 mL (has no administration in time range)  prochlorperazine (COMPAZINE) injection 10 mg (has no administration in time range)  diphenhydrAMINE (BENADRYL) injection 12.5 mg (has no administration in time range)  methylPREDNISolone sodium succinate (SOLU-MEDROL) 125 mg/2 mL injection 125 mg (has no administration in time range)                                                                                                                                     Procedures Procedures  (including critical care time)  Medical Decision Making / ED Course   This patient presents to the ED for concern of ***, this involves an extensive number of treatment options, and is a complaint that carries with it a high risk of complications and morbidity.  The differential diagnosis includes ***  MDM: ***   Additional history obtained: -Additional history obtained from *** -External records from outside source obtained and reviewed including: Chart review including previous notes, labs, imaging, consultation notes   Lab Tests: -I ordered, reviewed, and interpreted labs.   The pertinent results include:   Labs Reviewed  CBC WITH DIFFERENTIAL/PLATELET  COMPREHENSIVE METABOLIC PANEL  LIPASE, BLOOD  URINALYSIS, ROUTINE W REFLEX MICROSCOPIC  BLOOD GAS, VENOUS  LACTIC ACID, PLASMA  LACTIC ACID, PLASMA  TROPONIN I (HIGH SENSITIVITY)      EKG ***  EKG Interpretation  Date/Time:    Ventricular Rate:    PR Interval:    QRS  Duration:   QT Interval:    QTC Calculation:   R Axis:     Text Interpretation:           Imaging Studies ordered: I ordered imaging studies including *** I independently visualized and interpreted imaging. I agree with the radiologist interpretation   Medicines ordered and prescription drug management: Meds ordered this encounter  Medications   ipratropium-albuterol (DUONEB) 0.5-2.5 (3) MG/3ML nebulizer solution 9 mL   lactated ringers bolus 1,000 mL   prochlorperazine (COMPAZINE) injection 10 mg   diphenhydrAMINE (BENADRYL) injection 12.5 mg   methylPREDNISolone sodium succinate (SOLU-MEDROL) 125 mg/2 mL injection 125 mg    IV methylprednisolone will be converted to either a q12h or q24h frequency with the same total daily dose (TDD).  Ordered Dose: 1 to 125 mg TDD; convert to: TDD q24h.  Ordered Dose: 126 to 250 mg TDD; convert to: TDD div q12h.  Ordered Dose: >250 mg TDD; DAW.    -I have reviewed the patients home medicines and have made adjustments as needed  Critical interventions ***  Consultations Obtained: I requested consultation with the ***,  and discussed lab and imaging findings as well as pertinent plan - they recommend: ***   Cardiac Monitoring: The patient was maintained on a cardiac monitor.  I personally viewed and interpreted the cardiac monitored which showed an underlying rhythm of: ***  Social Determinants of Health:  Factors impacting patients care include: ***   Reevaluation: After the interventions noted above, I reevaluated the patient and found that they have :{resolved/improved/worsened:23923::"improved"}  Co morbidities that complicate the patient evaluation  Past Medical History:  Diagnosis Date   Asthma    Chronic rhinitis around 1986   Diabetes mellitus without complication (HCC)       Dispostion: I considered admission for this patient, ***     Final Clinical Impression(s) / ED Diagnoses Final diagnoses:  None      @PCDICTATION @

## 2023-03-10 NOTE — H&P (Signed)
PCP:   Copland, [name], MD   Chief Complaint:  Syncope and collapse  HPI: This is a 62 year old female with past medical history diabetes mellitus type 1, chronic rhinitis, moderate persistent asthma, HLD.  Today at approximately 1 PM she went outside to some guarding.  On bending over she felt suddenly dizzy.  She sat up and her dizziness quickly resolved.  On resuming her gardening, she again became dizzy and lost consciousness.  It was an unwitnessed event.  When she recovered consciousness she was nauseous and vomiting.  She states she was very weak, had a headache, her chest was hurting and she found it difficult to breathe.  She was wheezing significantly and she had generalized recurrent chills.  She denies fever or cough.  Per patient last night she was sneezing.  In the ambulance patient was hypotensive, she was given 800 cc NS IV fluids.  On presenting to the ER, patient remained hypotensive with SBP in the 80s.  She was given an additional 2 L bolus. Patient presenting vitals 87/48, HR up to 111, RR up to 23, afebrile.  Lactic acid 3.5, repeat 4.5 after 3 L normal saline.  CT abdomen and pelvis shows multifocal ill-defined areas of airspace opacity in both lower lungs, consistent with atypical or infectious etiology.  Patient initially had extensive wheezing.  Review of Systems:  Per HPI.  Past Medical History: Past Medical History:  Diagnosis Date   Asthma    Chronic rhinitis around 1986   Diabetes mellitus without complication [facility])    Past Surgical History:  Procedure Laterality Date   CESAREAN SECTION  1993, 1997, 1999   VEIN SURGERY  2012    Medications: Prior to Admission medications   Medication Sig Start Date End Date Taking? Authorizing Provider  Albuterol-Budesonide (AIRSUPRA) 90-80 MCG/ACT AERO Inhale 2 Inhalations into the lungs as needed. Maximum 12 puffs per day. 07/26/22   Verlee Monte, MD  atorvastatin (LIPITOR) 20 MG tablet Take 1 tablet (20 mg total) by  mouth daily. 10/12/22   Copland, [name], MD  Budeson-Glycopyrrol-Formoterol (BREZTRI AEROSPHERE) 160-9-4.8 MCG/ACT AERO 2 puffs twice daily with spacer to prevent coughing or wheezing. Rinse, gargle, and spit after use. 09/13/22   Verlee Monte, MD  FLOVENT HFA 110 MCG/ACT inhaler Inhale 2 puffs into the lungs 2 (two) times daily. 06/28/22   Verlee Monte, MD  fluticasone (FLONASE) 50 MCG/ACT nasal spray SHAKE LIQUID AND USE 1 SPRAY IN EACH NOSTRIL DAILY AS NEEDED FOR ALLERGIES 01/25/23   Verlee Monte, MD  insulin degludec (TRESIBA FLEXTOUCH) 100 UNIT/ML FlexTouch Pen Inject 20 Units into the skin daily.    [provider]  insulin lispro protamine-lispro (HUMALOG 75/25 MIX) (75-25) 100 UNIT/ML SUSP injection Inject 6 Units into the skin in the morning, at noon, and at bedtime.    [provider]  Insulin Pen Needle (BD PEN NEEDLE NANO 2ND GEN) 32G X 4 MM MISC 2 times daily 02/01/22   Shamleffer, Konrad Dolores, MD  montelukast (SINGULAIR) 10 MG tablet Take 1 tablet (10 mg total) by mouth at bedtime. 08/02/22   Verlee Monte, MD  [facility] 10 MG/0.5ML Pen SMARTSIG:10 Milligram(s) SUB-Q Once a Week 07/06/22   [provider]  venlafaxine XR (EFFEXOR-XR) 150 MG 24 hr capsule Take 1 capsule (150 mg total) by mouth daily with breakfast. 01/09/23   Copland, [name], MD    Allergies:   Allergies  Allergen Reactions   Aspirin Swelling  Swelling of the face     Social History:  reports that she has never smoked. She has never used smokeless tobacco. She reports that she does not drink alcohol and does not use drugs.  Family History: Family History  Problem Relation Age of Onset   Diabetes Mother    Hypertension Mother    Allergic rhinitis Father    Allergic rhinitis Sister    Asthma Sister    Urticaria Sister    Diabetes Maternal Grandmother    Hypertension Maternal Grandmother     Physical Exam: Vitals:   03/10/23 1745 03/10/23 1800 03/10/23 1815  03/10/23 1830  BP: (!) 105/48 (!) 103/46 (!) 100/49 (!) 97/51  Pulse: (!) 117 (!) 109 (!) 106 (!) 111  Resp: 12 (!) 21 16 (!) 22  Temp:      TempSrc:      SpO2: 100% 100% 100% 94%  Weight:      Height:        General:  Alert and oriented times three, well developed and nourished, no acute distress.  Weak spent appearing female, mildly diaphoretic Eyes: PERRLA, pink conjunctiva, no scleral icterus ENT: Dry lips , neck supple, no thyromegaly Lungs: clear to ascultation, no wheeze, no crackles, no use of accessory muscles Cardiovascular: regular rate and rhythm, no regurgitation, no gallops, no murmurs. No carotid bruits, no JVD Abdomen: soft, positive BS, non-tender, non-distended, no organomegaly, not an acute abdomen GU: not examined Neuro: CN II - XII grossly intact, sensation intact Musculoskeletal: strength 5/5 all extremities, no clubbing, cyanosis or edema Skin: no rash, no subcutaneous crepitation, no decubitus Psych: appropriate patient   Labs on Admission:  Recent Labs    03/10/23 1538 03/10/23 1604 03/10/23 1850  NA 140 143 137  K 2.7* 2.7* 3.5  CL 106  --  101  CO2 21*  --  21*  GLUCOSE 222*  --  276*  BUN 13  --  13  CREATININE 1.01*  --  0.92  CALCIUM 8.2*  --  8.7*   Recent Labs    03/10/23 1538  AST 22  ALT 19  ALKPHOS 47  BILITOT 0.5  PROT 5.9*  ALBUMIN 3.3*   Recent Labs    03/10/23 1538  LIPASE 46   Recent Labs    03/10/23 1538 03/10/23 1604  WBC 8.0  --   NEUTROABS 5.0  --   HGB 13.0 13.6  HCT 39.9 40.0  MCV 93.2  --   PLT 230  --     Radiological Exams on Admission: CT Head Wo Contrast  Result Date: 03/10/2023 CLINICAL DATA:  Syncope/presyncope EXAM: CT HEAD WITHOUT CONTRAST TECHNIQUE: Contiguous axial images were obtained from the base of the skull through the vertex without intravenous contrast. RADIATION DOSE REDUCTION: This exam was performed according to the departmental dose-optimization program which includes automated  exposure control, adjustment of the mA and/or kV according to patient size and/or use of iterative reconstruction technique. COMPARISON:  None Available. FINDINGS: Brain: No acute intracranial findings are seen and noncontrast CT brain. There are no signs of bleeding within the cranium. Ventricles are unremarkable. Cortical sulci are prominent. Vascular: Unremarkable. Skull: No acute findings are seen. Sinuses/Orbits: There is mucosal thickening in ethmoid and maxillary sinuses. Other: There is increased amount of CSF in the sella suggesting partial empty sella. IMPRESSION: No acute intracranial findings are seen in noncontrast CT brain. Atrophy. Chronic sinusitis. Electronically Signed   By: Ernie Avena M.D.   On: 03/10/2023 18:03  CT ABDOMEN PELVIS W CONTRAST  Result Date: 03/10/2023 CLINICAL DATA:  Epigastric abdominal pain beginning today. Vomiting. Shortness of breath. EXAM: CT ABDOMEN AND PELVIS WITH CONTRAST TECHNIQUE: Multidetector CT imaging of the abdomen and pelvis was performed using the standard protocol following bolus administration of intravenous contrast. RADIATION DOSE REDUCTION: This exam was performed according to the departmental dose-optimization program which includes automated exposure control, adjustment of the mA and/or kV according to patient size and/or use of iterative reconstruction technique. CONTRAST:  75mL OMNIPAQUE IOHEXOL 350 MG/ML SOLN COMPARISON:  None Available. FINDINGS: Lower Chest: Multifocal ill-defined areas of airspace opacity are seen in both lower lungs consistent with atypical infectious or inflammatory etiology. No evidence of pleural effusion. Hepatobiliary: No hepatic masses identified. Gallbladder is unremarkable. No evidence of biliary ductal dilatation. Pancreas:  No mass or inflammatory changes. Spleen: Within normal limits in size and appearance. Adrenals/Urinary Tract: No suspicious masses identified. No evidence of ureteral calculi or  hydronephrosis. Unremarkable unopacified urinary bladder. Stomach/Bowel: No evidence of obstruction, inflammatory process or abnormal fluid collections. Normal appendix visualized. Vascular/Lymphatic: No pathologically enlarged lymph nodes. No acute vascular findings. Reproductive: No mass or other significant abnormality. Previous tubal ligation clips noted. Other:  None. Musculoskeletal:  No suspicious bone lesions identified. IMPRESSION: No acute findings within the abdomen or pelvis. Multifocal ill-defined areas of airspace opacity in both lower lungs, consistent with atypical infectious or inflammatory etiology. Electronically Signed   By: Danae Orleans M.D.   On: 03/10/2023 17:57   DG Chest Portable 1 View  Result Date: 03/10/2023 CLINICAL DATA:  Dyspnea.  Syncope.  Dizziness. EXAM: PORTABLE CHEST 1 VIEW COMPARISON:  01/17/2023 FINDINGS: The heart size and mediastinal contours are within normal limits. Both lungs are clear. The visualized skeletal structures are unremarkable. IMPRESSION: No active disease. Electronically Signed   By: Danae Orleans M.D.   On: 03/10/2023 16:11    Assessment/Plan Present on Admission:  Sepsis due to pneumonia/ lactic acidosis -Pneumonia order set initiated -Blood cultures x 2, Legionella and strep pneumonia antigens ordered -IV Rocephin and azithromycin -Rule out COVID   Syncope and collapse -Due to pneumonia and hypotension   Moderate persistent asthma with exacerbation -Exacerbation due to pneumonia -P.o. steroids ordered,  -scheduled and as needed nebulizers ordered   Diabetes mellitus type 1, uncontrolled -Sliding scale insulin ordered -Resume home meds Tresiba and 7025 mix 60 units in the morning, noon and bedtime -ADA diet -Patient takes Greggory Keen now every Thursday   Dyslipidemia -Lipitor resumed   Chronic rhinitis -Continue Flonase, Singulair  Sierra Lyons 03/10/2023, 8:38 PM

## 2023-03-10 NOTE — ED Triage Notes (Signed)
Pt BIB [facility] from home, pt was outside gardening and felt dizzy, sat down and had a syncopal episode. Pt did not hit head. On EMS arrival pt c/o head throbbing, SOB, vomiting. EMS reports pt diaphoretic on scene, wheezing in all fields (hx asthma). Initial SpO2 86% on room air, given a duoneb with improvement of sx and SpO2 98% on room air. Initial BP 80/42, given NS, most recent BP 108 palp. CBG 217, hx DM.

## 2023-03-11 LAB — BASIC METABOLIC PANEL
Anion gap: 8 (ref 5–15)
BUN: 10 mg/dL (ref 6–20)
CO2: 22 mmol/L (ref 22–32)
Calcium: 8.3 mg/dL — ABNORMAL LOW (ref 8.9–10.3)
Chloride: 103 mmol/L (ref 98–111)
Creatinine, Ser: 0.76 mg/dL (ref 0.44–1.00)
GFR, Estimated: >60 mL/min (ref >60)
Glucose, Bld: 308 mg/dL — ABNORMAL HIGH (ref 70–99)
Potassium: 4.3 mmol/L (ref 3.5–5.1)
Sodium: 133 mmol/L — ABNORMAL LOW (ref 135–145)

## 2023-03-11 LAB — CBC WITH DIFFERENTIAL/PLATELET
Abs Immature Granulocytes: 0.1 10*3/uL — ABNORMAL HIGH (ref 0.00–0.07)
Basophils Absolute: 0 10*3/uL (ref 0.0–0.1)
Basophils Relative: 0 %
Eosinophils Absolute: 0 10*3/uL (ref 0.0–0.5)
Eosinophils Relative: 0 %
HCT: 33.9 % — ABNORMAL LOW (ref 36.0–46.0)
Hemoglobin: 11 g/dL — ABNORMAL LOW (ref 12.0–15.0)
Immature Granulocytes: 1 %
Lymphocytes Relative: 4 %
Lymphs Abs: 0.5 10*3/uL — ABNORMAL LOW (ref 0.7–4.0)
MCH: 29.7 pg (ref 26.0–34.0)
MCHC: 32.4 g/dL (ref 30.0–36.0)
MCV: 91.6 fL (ref 80.0–100.0)
Monocytes Absolute: 0.5 10*3/uL (ref 0.1–1.0)
Monocytes Relative: 4 %
Neutro Abs: 11.2 10*3/uL — ABNORMAL HIGH (ref 1.7–7.7)
Neutrophils Relative %: 91 %
Platelets: 190 10*3/uL (ref 150–400)
RBC: 3.7 MIL/uL — ABNORMAL LOW (ref 3.87–5.11)
RDW: 13.7 % (ref 11.5–15.5)
WBC: 12.3 10*3/uL — ABNORMAL HIGH (ref 4.0–10.5)
nRBC: 0 % (ref 0.0–0.2)

## 2023-03-11 LAB — GLUCOSE, CAPILLARY
Glucose-Capillary: 152 mg/dL — ABNORMAL HIGH (ref 70–99)
Glucose-Capillary: 115 mg/dL — ABNORMAL HIGH (ref 70–99)

## 2023-03-11 LAB — CULTURE, BLOOD (ROUTINE X 2)
Culture: NO GROWTH
Culture: NO GROWTH
Special Requests: ADEQUATE

## 2023-03-11 LAB — LACTIC ACID, PLASMA
Lactic Acid, Venous: 2.6 mmol/L (ref 0.5–1.9)
Lactic Acid, Venous: 1.4 mmol/L (ref 0.5–1.9)

## 2023-03-11 LAB — MAGNESIUM: Magnesium: 1.5 mg/dL — ABNORMAL LOW (ref 1.7–2.4)

## 2023-03-11 LAB — BLOOD GAS, VENOUS
Acid-Base Excess: 3.5 mmol/L — ABNORMAL HIGH (ref 0.0–2.0)
Bicarbonate: 29.1 mmol/L — ABNORMAL HIGH (ref 20.0–28.0)
O2 Saturation: 43.1 %
Patient temperature: 36.7
pCO2, Ven: 46 mmHg (ref 44–60)
pH, Ven: 7.4 (ref 7.25–7.43)
pO2, Ven: <31 mmHg — CL (ref 32–45)

## 2023-03-11 LAB — CBG MONITORING, ED
Glucose-Capillary: 218 mg/dL — ABNORMAL HIGH (ref 70–99)
Glucose-Capillary: 104 mg/dL — ABNORMAL HIGH (ref 70–99)

## 2023-03-11 LAB — SARS CORONAVIRUS 2 BY RT PCR: SARS Coronavirus 2 by RT PCR: NEGATIVE

## 2023-03-11 LAB — STREP PNEUMONIAE URINARY ANTIGEN: Strep Pneumo Urinary Antigen: NEGATIVE

## 2023-03-11 NOTE — ED Notes (Signed)
0.9% NaCl with KCL 20 mEq/L infusion on hold until pharmacy sends new bag. Requested multiple over night per night shift, requested again today at 0715.

## 2023-03-11 NOTE — ED Notes (Signed)
MD notified that patient would like to speak with provider.

## 2023-03-11 NOTE — ED Notes (Signed)
ED TO INPATIENT HANDOFF REPORT  ED Nurse Name and Phone #:  Lenis Dickinson [phone]  S Name/Age/Gender Sierra Lyons 62 y.o. female Room/Bed: 045C/045C  Code Status   Code Status: Full Code  Home/SNF/Other Home Patient oriented to: self, place, time, and situation Is this baseline? Yes   Triage Complete: Triage complete  Chief Complaint Sepsis due to pneumonia (HCC) [J18.9, A41.9]  Triage Note Pt BIB [facility] from home, pt was outside gardening and felt dizzy, sat down and had a syncopal episode. Pt did not hit head. On EMS arrival pt c/o head throbbing, SOB, vomiting. EMS reports pt diaphoretic on scene, wheezing in all fields (hx asthma). Initial SpO2 86% on room air, given a duoneb with improvement of sx and SpO2 98% on room air. Initial BP 80/42, given NS, most recent BP 108 palp. CBG 217, hx DM.    Allergies Allergies  Allergen Reactions   Aspirin Swelling    Swelling of the face     Level of Care/Admitting Diagnosis ED Disposition     ED Disposition  Admit   Condition  --   Comment  Hospital Area: [facility] [[id number]]  Level of Care: Telemetry Medical [[id number]]  May admit patient to [facility] if equivalent level of care is available:: Yes  Covid Evaluation: Confirmed COVID Negative  Diagnosis: Sepsis due to pneumonia [facility]) [[id number]]  Admitting Physician: Gery Pray [[id number]]  Attending Physician: Gery Pray [[id number]]  Certification:: I certify this patient will need inpatient services for at least 2 midnights  Estimated Length of Stay: 2          B Medical/Surgery History Past Medical History:  Diagnosis Date   Asthma    Chronic rhinitis around 1986   Diabetes mellitus without complication (HCC)    Past Surgical History:  Procedure Laterality Date   CESAREAN SECTION  1993, 1997, 1999   VEIN SURGERY  2012     A IV Location/Drains/Wounds Patient Lines/Drains/Airways Status     Active Line/Drains/Airways      Name Placement date Placement time Site Days   Peripheral IV 03/10/23 18 G Left Antecubital 03/10/23  1546  Antecubital  1   Peripheral IV 03/10/23 20 G Left;Posterior Hand 03/10/23  1547  Hand  1   Peripheral IV 03/10/23 22 G Anterior;Right Forearm 03/10/23  2215  Forearm  1            Intake/Output Last 24 hours  Intake/Output Summary (Last 24 hours) at 03/11/2023 1336 Last data filed at 03/10/2023 2337 Gross per 24 hour  Intake 850 ml  Output --  Net 850 ml    Labs/Imaging Results for orders placed or performed during the hospital encounter of 03/10/23 (from the past 48 hour(s))  CBC with Differential     Status: None   Collection Time: 03/10/23  3:38 PM  Result Value Ref Range   WBC 8.0 4.0 - 10.5 K/uL   RBC 4.28 3.87 - 5.11 MIL/uL   Hemoglobin 13.0 12.0 - 15.0 g/dL   HCT 19.1 47.8 - 29.5 %   MCV 93.2 80.0 - 100.0 fL   MCH 30.4 26.0 - 34.0 pg   MCHC 32.6 30.0 - 36.0 g/dL   RDW 62.1 30.8 - 65.7 %   Platelets 230 150 - 400 K/uL   nRBC 0.0 0.0 - 0.2 %   Neutrophils Relative % 61 %   Neutro Abs 5.0 1.7 - 7.7 K/uL   Lymphocytes Relative 33 %  Lymphs Abs 2.6 0.7 - 4.0 K/uL   Monocytes Relative 4 %   Monocytes Absolute 0.3 0.1 - 1.0 K/uL   Eosinophils Relative 1 %   Eosinophils Absolute 0.0 0.0 - 0.5 K/uL   Basophils Relative 0 %   Basophils Absolute 0.0 0.0 - 0.1 K/uL   Immature Granulocytes 1 %   Abs Immature Granulocytes 0.04 0.00 - 0.07 K/uL    Comment: Performed at [facility], [address], [place], [place] [address]  Comprehensive metabolic panel     Status: Abnormal   Collection Time: 03/10/23  3:38 PM  Result Value Ref Range   Sodium 140 135 - 145 mmol/L   Potassium 2.7 (LL) 3.5 - 5.1 mmol/L    Comment: CRITICAL RESULT CALLED TO, READ BACK BY AND VERIFIED WITH C,COGAN RN @1632  03/10/23 E,BENTON   Chloride 106 98 - 111 mmol/L   CO2 21 (L) 22 - 32 mmol/L   Glucose, Bld 222 (H) 70 - 99 mg/dL    Comment: Glucose reference range applies only to  samples taken after fasting for at least 8 hours.   BUN 13 6 - 20 mg/dL   Creatinine, Ser 6.04 (H) 0.44 - 1.00 mg/dL   Calcium 8.2 (L) 8.9 - 10.3 mg/dL   Total Protein 5.9 (L) 6.5 - 8.1 g/dL   Albumin 3.3 (L) 3.5 - 5.0 g/dL   AST 22 15 - 41 U/L   ALT 19 0 - 44 U/L   Alkaline Phosphatase 47 38 - 126 U/L   Total Bilirubin 0.5 0.3 - 1.2 mg/dL   GFR, Estimated >54 >09 mL/min    Comment: (NOTE) Calculated using the CKD-EPI Creatinine Equation (2021)    Anion gap 13 5 - 15    Comment: Performed at [facility], [address] [place], [place] [address]  Lipase, blood     Status: None   Collection Time: 03/10/23  3:38 PM  Result Value Ref Range   Lipase 46 11 - 51 U/L    Comment: Performed at [facility], [address] [place], [place] [address]  Urinalysis, Routine w reflex microscopic -Urine, Clean Catch     Status: Abnormal   Collection Time: 03/10/23  3:38 PM  Result Value Ref Range   Color, Urine YELLOW YELLOW   APPearance CLEAR CLEAR   Specific Gravity, Urine 1.010 1.005 - 1.030   pH 5.5 5.0 - 8.0   Glucose, UA >=500 (A) NEGATIVE mg/dL   Hgb urine dipstick NEGATIVE NEGATIVE   Bilirubin Urine NEGATIVE NEGATIVE   Ketones, ur NEGATIVE NEGATIVE mg/dL   Protein, ur NEGATIVE NEGATIVE mg/dL   Nitrite NEGATIVE NEGATIVE   Leukocytes,Ua TRACE (A) NEGATIVE    Comment: Performed at [facility], [address], [place], [place] [address]  Troponin I (High Sensitivity)     Status: None   Collection Time: 03/10/23  3:38 PM  Result Value Ref Range   Troponin I (High Sensitivity) 4 <18 ng/L    Comment: (NOTE) Elevated high sensitivity troponin I (hsTnI) values and significant  changes across serial measurements may suggest ACS but many other  chronic and acute conditions are known to elevate hsTnI results.  Refer to the "Links" section for chest pain algorithms and additional  guidance. Performed at [facility], [address], [place], [place] [address]    Urinalysis, Microscopic (reflex)     Status: Abnormal   Collection Time: 03/10/23  3:38 PM  Result Value  Ref Range   RBC / HPF 0-5 0 - 5 RBC/hpf   WBC, UA 0-5 0 - 5 WBC/hpf   Bacteria, UA RARE (A) NONE SEEN   Squamous Epithelial / HPF 0-5 0 - 5 /HPF    Comment: Performed at [facility], [address] [place], [place] [address]  Lactic acid, plasma     Status: Abnormal   Collection Time: 03/10/23  3:39 PM  Result Value Ref Range   Lactic Acid, Venous 3.5 (HH) 0.5 - 1.9 mmol/L    Comment: CRITICAL RESULT CALLED TO, READ BACK BY AND VERIFIED WITH C,COGAN RN @1629  03/10/23 E,BENTON Performed at [facility], [address], [place], [place] [address]   I-Stat venous blood gas, ED     Status: Abnormal   Collection Time: 03/10/23  4:04 PM  Result Value Ref Range   pH, Ven 7.273 7.25 - 7.43   pCO2, Ven 47.9 44 - 60 mmHg   pO2, Ven 45 32 - 45 mmHg   Bicarbonate 22.1 20.0 - 28.0 mmol/L   TCO2 24 22 - 32 mmol/L   O2 Saturation 74 %   Acid-base deficit 5.0 (H) 0.0 - 2.0 mmol/L   Sodium 143 135 - 145 mmol/L   Potassium 2.7 (LL) 3.5 - 5.1 mmol/L   Calcium, Ion 1.11 (L) 1.15 - 1.40 mmol/L   HCT 40.0 36.0 - 46.0 %   Hemoglobin 13.6 12.0 - 15.0 g/dL   Sample type VENOUS   Lactic acid, plasma     Status: Abnormal   Collection Time: 03/10/23  6:50 PM  Result Value Ref Range   Lactic Acid, Venous 4.3 (HH) 0.5 - 1.9 mmol/L    Comment: CRITICAL RESULT CALLED TO, READ BACK BY AND VERIFIED WITH M,KOMMOR MD @2000  03/10/23 E,BENTON Performed at [facility], [address] [place], [place] [address]   Troponin I (High Sensitivity)     Status: None   Collection Time: 03/10/23  6:50 PM  Result Value Ref Range   Troponin I (High Sensitivity) 6 <18 ng/L    Comment: (NOTE) Elevated high sensitivity troponin I (hsTnI) values and significant  changes across serial measurements may suggest ACS but many other  chronic and acute conditions are known to elevate hsTnI results.  Refer  to the "Links" section for chest pain algorithms and additional  guidance. Performed at [facility], [address] [place], [place] [address]   Basic metabolic panel     Status: Abnormal   Collection Time: 03/10/23  6:50 PM  Result Value Ref Range   Sodium 137 135 - 145 mmol/L   Potassium 3.5 3.5 - 5.1 mmol/L   Chloride 101 98 - 111 mmol/L   CO2 21 (L) 22 - 32 mmol/L   Glucose, Bld 276 (H) 70 - 99 mg/dL    Comment: Glucose reference range applies only to samples taken after fasting for at least 8 hours.   BUN 13 6 - 20 mg/dL   Creatinine, Ser 2.95 0.44 - 1.00 mg/dL   Calcium 8.7 (L) 8.9 - 10.3 mg/dL   GFR, Estimated >62 >13 mL/min    Comment: (NOTE) Calculated using the CKD-EPI Creatinine Equation (2021)    Anion gap 15 5 - 15    Comment: Performed at [facility], [address] [place], [place] [address]  CBG monitoring, ED     Status: Abnormal   Collection Time: 03/10/23  8:24 PM  Result Value Ref Range  Glucose-Capillary 272 (H) 70 - 99 mg/dL    Comment: Glucose reference range applies only to samples taken after fasting for at least 8 hours.  Culture, blood (Routine X 2) w Reflex to ID Panel     Status: None (Preliminary result)   Collection Time: 03/10/23  8:41 PM   Specimen: BLOOD  Result Value Ref Range   Specimen Description BLOOD BLOOD LEFT FOREARM    Special Requests      BOTTLES DRAWN AEROBIC AND ANAEROBIC Blood Culture adequate volume   Culture      NO GROWTH < 12 HOURS Performed at [facility], [address] [place], [place] [address]    Report Status PENDING   Culture, blood (Routine X 2) w Reflex to ID Panel     Status: None (Preliminary result)   Collection Time: 03/10/23  8:48 PM   Specimen: BLOOD  Result Value Ref Range   Specimen Description BLOOD RIGHT ARM    Special Requests      BOTTLES DRAWN AEROBIC AND ANAEROBIC Blood Culture adequate volume   Culture      NO GROWTH < 12 HOURS Performed at [facility],  [address] [place], [place] [address]    Report Status PENDING   Hemoglobin A1c     Status: Abnormal   Collection Time: 03/10/23  8:48 PM  Result Value Ref Range   Hgb A1c MFr Bld 6.2 (H) 4.8 - 5.6 %    Comment: (NOTE) Pre diabetes:          5.7%-6.4%  Diabetes:              >6.4%  Glycemic control for   <7.0% adults with diabetes    Mean Plasma Glucose 131.24 mg/dL    Comment: Performed at [facility], [address] [place], [place] [address]  Strep pneumoniae urinary antigen     Status: None   Collection Time: 03/10/23  9:21 PM  Result Value Ref Range   Strep Pneumo Urinary Antigen NEGATIVE NEGATIVE    Comment:        Infection due to S. pneumoniae cannot be absolutely ruled out since the antigen present may be below the detection limit of the test. Performed at [facility], [address], [place], [place] [address]   CBG monitoring, ED     Status: Abnormal   Collection Time: 03/10/23 10:46 PM  Result Value Ref Range   Glucose-Capillary 268 (H) 70 - 99 mg/dL    Comment: Glucose reference range applies only to samples taken after fasting for at least 8 hours.  SARS Coronavirus 2 by RT PCR (hospital order, performed in [facility] hospital lab) *cepheid single result test*     Status: None   Collection Time: 03/10/23 11:17 PM  Result Value Ref Range   SARS Coronavirus 2 by RT PCR NEGATIVE NEGATIVE    Comment: Performed at [facility], [address], [place], [place] [address]  CBC with Differential/Platelet     Status: Abnormal   Collection Time: 03/11/23  3:57 AM  Result Value Ref Range   WBC 12.3 (H) 4.0 - 10.5 K/uL   RBC 3.70 (L) 3.87 - 5.11 MIL/uL   Hemoglobin 11.0 (L) 12.0 - 15.0 g/dL   HCT 13.0 (L) 86.5 - 78.4 %   MCV 91.6 80.0 - 100.0 fL   MCH 29.7 26.0 - 34.0 pg   MCHC 32.4 30.0 - 36.0 g/dL   RDW  13.7 11.5 - 15.5 %   Platelets 190 150 - 400 K/uL   nRBC 0.0 0.0 - 0.2 %   Neutrophils Relative % 91 %   Neutro Abs 11.2 (H) 1.7 - 7.7  K/uL   Lymphocytes Relative 4 %   Lymphs Abs 0.5 (L) 0.7 - 4.0 K/uL   Monocytes Relative 4 %   Monocytes Absolute 0.5 0.1 - 1.0 K/uL   Eosinophils Relative 0 %   Eosinophils Absolute 0.0 0.0 - 0.5 K/uL   Basophils Relative 0 %   Basophils Absolute 0.0 0.0 - 0.1 K/uL   Immature Granulocytes 1 %   Abs Immature Granulocytes 0.10 (H) 0.00 - 0.07 K/uL    Comment: Performed at [facility], [address] [place], [place] [address]  Basic metabolic panel     Status: Abnormal   Collection Time: 03/11/23  3:57 AM  Result Value Ref Range   Sodium 133 (L) 135 - 145 mmol/L   Potassium 4.3 3.5 - 5.1 mmol/L   Chloride 103 98 - 111 mmol/L   CO2 22 22 - 32 mmol/L   Glucose, Bld 308 (H) 70 - 99 mg/dL    Comment: Glucose reference range applies only to samples taken after fasting for at least 8 hours.   BUN 10 6 - 20 mg/dL   Creatinine, Ser 6.04 0.44 - 1.00 mg/dL   Calcium 8.3 (L) 8.9 - 10.3 mg/dL   GFR, Estimated >54 >09 mL/min    Comment: (NOTE) Calculated using the CKD-EPI Creatinine Equation (2021)    Anion gap 8 5 - 15    Comment: Performed at [facility], [address], [place], [place] [address]  Magnesium     Status: Abnormal   Collection Time: 03/11/23  3:57 AM  Result Value Ref Range   Magnesium 1.5 (L) 1.7 - 2.4 mg/dL    Comment: Performed at [facility], [address] [place], [place] [address]  CBG monitoring, ED     Status: Abnormal   Collection Time: 03/11/23  7:37 AM  Result Value Ref Range   Glucose-Capillary 218 (H) 70 - 99 mg/dL    Comment: Glucose reference range applies only to samples taken after fasting for at least 8 hours.  Lactic acid, plasma     Status: Abnormal   Collection Time: 03/11/23  9:39 AM  Result Value Ref Range   Lactic Acid, Venous 2.6 (HH) 0.5 - 1.9 mmol/L    Comment: CRITICAL VALUE NOTED. VALUE IS CONSISTENT WITH PREVIOUSLY REPORTED/CALLED VALUE Performed at [facility], [address], [place], [place] [address]    Lactic acid, plasma     Status: None   Collection Time: 03/11/23 12:17 PM  Result Value Ref Range   Lactic Acid, Venous 1.4 0.5 - 1.9 mmol/L    Comment: Performed at [facility], [address], [place], [place] [address]  CBG monitoring, ED     Status: Abnormal   Collection Time: 03/11/23 12:23 PM  Result Value Ref Range   Glucose-Capillary 104 (H) 70 - 99 mg/dL    Comment: Glucose reference range applies only to samples taken after fasting for at least 8 hours.   CT Head Wo Contrast  Result Date: 03/10/2023 CLINICAL DATA:  Syncope/presyncope EXAM: CT HEAD WITHOUT CONTRAST TECHNIQUE: Contiguous axial images were obtained from the base of the skull through the vertex without intravenous contrast. RADIATION DOSE REDUCTION: This exam was performed according to the departmental dose-optimization program  which includes automated exposure control, adjustment of the mA and/or kV according to patient size and/or use of iterative reconstruction technique. COMPARISON:  None Available. FINDINGS: Brain: No acute intracranial findings are seen and noncontrast CT brain. There are no signs of bleeding within the cranium. Ventricles are unremarkable. Cortical sulci are prominent. Vascular: Unremarkable. Skull: No acute findings are seen. Sinuses/Orbits: There is mucosal thickening in ethmoid and maxillary sinuses. Other: There is increased amount of CSF in the sella suggesting partial empty sella. IMPRESSION: No acute intracranial findings are seen in noncontrast CT brain. Atrophy. Chronic sinusitis. Electronically Signed   By: Ernie Avena M.D.   On: 03/10/2023 18:03   CT ABDOMEN PELVIS W CONTRAST  Result Date: 03/10/2023 CLINICAL DATA:  Epigastric abdominal pain beginning today. Vomiting. Shortness of breath. EXAM: CT ABDOMEN AND PELVIS WITH CONTRAST TECHNIQUE: Multidetector CT imaging of the abdomen and pelvis was performed using the standard protocol following bolus administration of  intravenous contrast. RADIATION DOSE REDUCTION: This exam was performed according to the departmental dose-optimization program which includes automated exposure control, adjustment of the mA and/or kV according to patient size and/or use of iterative reconstruction technique. CONTRAST:  75mL OMNIPAQUE IOHEXOL 350 MG/ML SOLN COMPARISON:  None Available. FINDINGS: Lower Chest: Multifocal ill-defined areas of airspace opacity are seen in both lower lungs consistent with atypical infectious or inflammatory etiology. No evidence of pleural effusion. Hepatobiliary: No hepatic masses identified. Gallbladder is unremarkable. No evidence of biliary ductal dilatation. Pancreas:  No mass or inflammatory changes. Spleen: Within normal limits in size and appearance. Adrenals/Urinary Tract: No suspicious masses identified. No evidence of ureteral calculi or hydronephrosis. Unremarkable unopacified urinary bladder. Stomach/Bowel: No evidence of obstruction, inflammatory process or abnormal fluid collections. Normal appendix visualized. Vascular/Lymphatic: No pathologically enlarged lymph nodes. No acute vascular findings. Reproductive: No mass or other significant abnormality. Previous tubal ligation clips noted. Other:  None. Musculoskeletal:  No suspicious bone lesions identified. IMPRESSION: No acute findings within the abdomen or pelvis. Multifocal ill-defined areas of airspace opacity in both lower lungs, consistent with atypical infectious or inflammatory etiology. Electronically Signed   By: Danae Orleans M.D.   On: 03/10/2023 17:57   DG Chest Portable 1 View  Result Date: 03/10/2023 CLINICAL DATA:  Dyspnea.  Syncope.  Dizziness. EXAM: PORTABLE CHEST 1 VIEW COMPARISON:  01/17/2023 FINDINGS: The heart size and mediastinal contours are within normal limits. Both lungs are clear. The visualized skeletal structures are unremarkable. IMPRESSION: No active disease. Electronically Signed   By: Danae Orleans M.D.   On:  03/10/2023 16:11    Pending Labs Unresulted Labs (From admission, onward)     Start     Ordered   03/17/23 0500  Creatinine, serum  (enoxaparin (LOVENOX)    CrCl >/= 30 ml/min)  Weekly,   R     Comments: while on enoxaparin therapy    03/10/23 2120   03/10/23 2121  Legionella Pneumophila Serogp 1 Ur Ag  Once,   R        03/10/23 2120   03/10/23 1539  Blood gas, venous (at [facility])  Once,   R        03/10/23 1538            Vitals/Pain Today's Vitals   03/11/23 1200 03/11/23 1239 03/11/23 1305 03/11/23 1309  BP: 113/68  108/70   Pulse: 91  97   Resp: 16  (!) 24   Temp:    98.1 F (36.7 C)  TempSrc:  Oral  SpO2: 98%  100%   Weight:      Height:      PainSc:  0-No pain      Isolation Precautions No active isolations  Medications Medications  lactated ringers infusion ( Intravenous New Bag/Given 03/10/23 2025)  0.9 % NaCl with KCl 20 mEq/ L  infusion ( Intravenous New Bag/Given 03/11/23 0902)  insulin aspart (novoLOG) injection 0-5 Units (3 Units Subcutaneous Given 03/10/23 2305)  insulin aspart (novoLOG) injection 0-15 Units ( Subcutaneous Not Given 03/11/23 1237)  enoxaparin (LOVENOX) injection 40 mg (40 mg Subcutaneous Given 03/10/23 2200)  cefTRIAXone (ROCEPHIN) 2 g in sodium chloride 0.9 % 100 mL IVPB (0 g Intravenous Stopped 03/10/23 2303)  azithromycin (ZITHROMAX) 500 mg in sodium chloride 0.9 % 250 mL IVPB (0 mg Intravenous Stopped 03/10/23 2303)  predniSONE (DELTASONE) tablet 40 mg (40 mg Oral Given 03/11/23 0804)  ipratropium-albuterol (DUONEB) 0.5-2.5 (3) MG/3ML nebulizer solution 3 mL (3 mLs Nebulization Patient Refused/Not Given 03/11/23 0601)  albuterol (PROVENTIL) (2.5 MG/3ML) 0.083% nebulizer solution 2.5 mg (has no administration in time range)  atorvastatin (LIPITOR) tablet 20 mg (20 mg Oral Given 03/11/23 0804)  montelukast (SINGULAIR) tablet 10 mg (10 mg Oral Given 03/10/23 2214)  insulin glargine-yfgn (SEMGLEE) injection 6 Units (6 Units Subcutaneous  Given 03/10/23 2304)  ipratropium-albuterol (DUONEB) 0.5-2.5 (3) MG/3ML nebulizer solution 9 mL (9 mLs Nebulization Given 03/10/23 1557)  lactated ringers bolus 1,000 mL (0 mLs Intravenous Stopped 03/10/23 1700)  prochlorperazine (COMPAZINE) injection 10 mg (10 mg Intravenous Given 03/10/23 1700)  diphenhydrAMINE (BENADRYL) injection 12.5 mg (12.5 mg Intravenous Given 03/10/23 1658)  methylPREDNISolone sodium succinate (SOLU-MEDROL) 125 mg/2 mL injection 125 mg (125 mg Intravenous Given 03/10/23 1556)  ondansetron (ZOFRAN) injection 4 mg (4 mg Intravenous Given 03/10/23 1555)  potassium chloride 10 mEq in 100 mL IVPB (0 mEq Intravenous Stopped 03/10/23 1855)  potassium chloride SA (KLOR-CON M) CR tablet 40 mEq (40 mEq Oral Given 03/10/23 1751)  magnesium oxide (MAG-OX) tablet 800 mg (800 mg Oral Given 03/10/23 1750)  lactated ringers bolus 1,000 mL (0 mLs Intravenous Stopped 03/10/23 1855)  iohexol (OMNIPAQUE) 350 MG/ML injection 75 mL (75 mLs Intravenous Contrast Given 03/10/23 1745)  lactated ringers bolus 500 mL (0 mLs Intravenous Stopped 03/10/23 2337)  feeding supplement (BOOST / RESOURCE BREEZE) liquid 1 Container (1 Container Oral Given 03/11/23 0036)    Mobility walks     Focused Assessments Cardiac Assessment Handoff:  Cardiac Rhythm: Normal sinus rhythm No results found for: "CKTOTAL", "CKMB", "CKMBINDEX", "TROPONINI" Lab Results  Component Value Date   DDIMER 0.38 05/19/2021   Does the Patient currently have chest pain? No    R Recommendations: See Admitting Provider Note  Report given to:   Additional Notes:

## 2023-03-11 NOTE — Progress Notes (Signed)
PROGRESS NOTE   Sierra Lyons  ZOX:[id number] DOB: [birth year] DOA: 03/10/2023 PCP: [name], MD   Date of Service: the patient was seen and examined on 03/11/2023  Brief Narrative:  This is a 62 year old female with past medical history diabetes mellitus type 1, chronic rhinitis, moderate persistent asthma, HLD presented with dizziness and LOC while gardening.  When she recovered consciousness she was nauseous and vomiting.  She states she was very weak, had a headache, her chest was hurting and she found it difficult to breathe.  She was wheezing significantly and she had generalized recurrent chills.  In the ambulance patient was hypotensive, she was given 800 cc NS IV fluids.  On presenting to the ER, patient remained hypotensive with SBP in the 80s.  She was given an additional 2 L bolus. Patient presenting vitals 87/48, HR up to 111, RR up to 23, afebrile.  Lactic acid 3.5, repeat 4.5 after 3 L normal saline.  CT abdomen and pelvis shows multifocal ill-defined areas of airspace opacity in both lower lungs, consistent with atypical or infectious etiology. Treated for chest sepsis.   Assessment and Plan:  Sepsis 2/2 pneumonia Lactic acidosis 3.5>4.3. WBC 12.3, afebrile overnight. Saturating well on room air. CXR: neg, however CT abdomen and pelvis shows multifocal ill-defined areas of airspace opacity in both lower lungs, consistent with atypical or infectious etiology. S/p 3 L normal saline. Covid neg. -Blood cultures x 2, Legionella and strep pneumonia antigens ordered -IV Rocephin and azithromycin x 5 days  -Trend lactic acids  -Continue IVF -PT/OT  Moderate persistent asthma exacerbation -Continue prednisone 40mg  daily x 5 -Albuterol nebs PRN   Diabetes mellitus type 1, uncontrolled CBGs 218, 268 -Continue sliding scale insulin plus meal time coverage 0-5 units -Continue Semglee 6 units  -Hold Mounjaro (pt takes it every Thursday)   Dyslipidemia -Continue Lipitor     Chronic rhinitis -Continue Flonase, Singulair  Subjective:  Pt reports improvement in symptoms since admission. She wants to go home. I explained she will need to stay longer for IV abx. Son and husband updated at bedside.   Physical Exam:  Vitals:   03/11/23 0600 03/11/23 0705 03/11/23 0800 03/11/23 0830  BP: 96/60 101/61 (!) 93/49 99/64  Pulse: (!) 106 (!) 107 98 95  Resp: 16 13 18 14   Temp:  97.9 F (36.6 C)    TempSrc:      SpO2: 98% 98% 99% 100%  Weight:      Height:        General: Alert, no acute distress Cardio: Normal S1 and S2, RRR, no r/m/g Pulm: CTAB, normal work of breathing Abdomen: Bowel sounds normal. Abdomen soft and non-tender.  Extremities: No peripheral edema.  Neuro: Cranial nerves grossly intact   Data Reviewed:  I have personally reviewed and interpreted labs, imaging.  CBC: Recent Labs  Lab 03/10/23 1538 03/10/23 1604 03/11/23 0357  WBC 8.0  --  12.3*  NEUTROABS 5.0  --  11.2*  HGB 13.0 13.6 11.0*  HCT 39.9 40.0 33.9*  MCV 93.2  --  91.6  PLT 230  --  190   Basic Metabolic Panel: Recent Labs  Lab 03/10/23 1538 03/10/23 1604 03/10/23 1850 03/11/23 0357  NA 140 143 137 133*  K 2.7* 2.7* 3.5 4.3  CL 106  --  101 103  CO2 21*  --  21* 22  GLUCOSE 222*  --  276* 308*  BUN 13  --  13 10  CREATININE 1.01*  --  0.92 0.76  CALCIUM 8.2*  --  8.7* 8.3*  MG  --   --   --  1.5*   GFR: Estimated Creatinine Clearance: 74.5 mL/min (by C-G formula based on SCr of 0.76 mg/dL). Liver Function Tests: Recent Labs  Lab 03/10/23 1538  AST 22  ALT 19  ALKPHOS 47  BILITOT 0.5  PROT 5.9*  ALBUMIN 3.3*    Coagulation Profile: No results for input(s): "INR", "PROTIME" in the last 168 hours.   Code Status:  Full code.  Code status decision has been confirmed with: patient  Family Communication: son and husband updated at bedside     Severity of Illness:  The appropriate patient status for this patient is INPATIENT. Inpatient status  is judged to be reasonable and necessary in order to provide the required intensity of service to ensure the patient's safety. The patient's presenting symptoms, physical exam findings, and initial radiographic and laboratory data in the context of their chronic comorbidities is felt to place them at high risk for further clinical deterioration. Furthermore, it is not anticipated that the patient will be medically stable for discharge from the hospital within 2 midnights of admission.   * I certify that at the point of admission it is my clinical judgment that the patient will require inpatient hospital care spanning beyond 2 midnights from the point of admission due to high intensity of service, high risk for further deterioration and high frequency of surveillance required.*   Author:  Rolm Gala MD  03/11/2023 9:13 AM

## 2023-03-11 NOTE — ED Notes (Signed)
Pt ambulated to the restroom with no difficulties.

## 2023-03-12 ENCOUNTER — Inpatient Hospital Stay (HOSPITAL_COMMUNITY): Payer: 59

## 2023-03-12 DIAGNOSIS — A419 Sepsis, unspecified organism: Secondary | ICD-10-CM | POA: Diagnosis not present

## 2023-03-12 LAB — MAGNESIUM: Magnesium: 1.9 mg/dL (ref 1.7–2.4)

## 2023-03-12 LAB — LEGIONELLA PNEUMOPHILA SEROGP 1 UR AG: L. pneumophila Serogp 1 Ur Ag: NEGATIVE

## 2023-03-12 LAB — GLUCOSE, CAPILLARY
Glucose-Capillary: 129 mg/dL — ABNORMAL HIGH (ref 70–99)
Glucose-Capillary: 139 mg/dL — ABNORMAL HIGH (ref 70–99)
Glucose-Capillary: 203 mg/dL — ABNORMAL HIGH (ref 70–99)
Glucose-Capillary: 199 mg/dL — ABNORMAL HIGH (ref 70–99)

## 2023-03-12 LAB — BASIC METABOLIC PANEL
Anion gap: 17 — ABNORMAL HIGH (ref 5–15)
BUN: 17 mg/dL (ref 6–20)
CO2: 22 mmol/L (ref 22–32)
Calcium: 9.5 mg/dL (ref 8.9–10.3)
Chloride: 102 mmol/L (ref 98–111)
Creatinine, Ser: 0.63 mg/dL (ref 0.44–1.00)
GFR, Estimated: >60 mL/min (ref >60)
Glucose, Bld: 139 mg/dL — ABNORMAL HIGH (ref 70–99)
Potassium: 3.8 mmol/L (ref 3.5–5.1)
Sodium: 141 mmol/L (ref 135–145)

## 2023-03-12 LAB — CBC
HCT: 34.6 % — ABNORMAL LOW (ref 36.0–46.0)
Hemoglobin: 11.5 g/dL — ABNORMAL LOW (ref 12.0–15.0)
MCH: 30.6 pg (ref 26.0–34.0)
MCHC: 33.2 g/dL (ref 30.0–36.0)
MCV: 92 fL (ref 80.0–100.0)
Platelets: 200 10*3/uL (ref 150–400)
RBC: 3.76 MIL/uL — ABNORMAL LOW (ref 3.87–5.11)
RDW: 13.8 % (ref 11.5–15.5)
WBC: 14.4 10*3/uL — ABNORMAL HIGH (ref 4.0–10.5)
nRBC: 0 % (ref 0.0–0.2)

## 2023-03-12 LAB — C-REACTIVE PROTEIN: CRP: 7 mg/dL — ABNORMAL HIGH (ref <1.0)

## 2023-03-12 LAB — CULTURE, BLOOD (ROUTINE X 2)

## 2023-03-12 LAB — BRAIN NATRIURETIC PEPTIDE: B Natriuretic Peptide: 152.4 pg/mL — ABNORMAL HIGH (ref 0.0–100.0)

## 2023-03-12 LAB — PROCALCITONIN: Procalcitonin: 4.47 ng/mL

## 2023-03-12 MED ORDER — TRAZODONE HCL 50 MG PO TABS
50.0000 mg | ORAL_TABLET | Freq: Once | ORAL | Status: AC
  Administered 2023-03-12: 50 mg via ORAL
  Filled 2023-03-12: qty 1

## 2023-03-12 MED ORDER — PREDNISONE 20 MG PO TABS: 20.0000 mg | ORAL_TABLET | Freq: Every day | ORAL | Status: DC

## 2023-03-12 MED ORDER — SODIUM CHLORIDE 0.9 % IV SOLN
3.0000 g | Freq: Four times a day (QID) | INTRAVENOUS | Status: DC
  Administered 2023-03-12 – 2023-03-14 (×8): 3 g via INTRAVENOUS
  Filled 2023-03-12 (×8): qty 8

## 2023-03-12 MED ORDER — PREDNISONE 5 MG PO TABS
10.0000 mg | ORAL_TABLET | Freq: Every day | ORAL | Status: AC
  Administered 2023-03-13: 10 mg via ORAL
  Filled 2023-03-12: qty 2

## 2023-03-12 MED ORDER — VENLAFAXINE HCL ER 75 MG PO CP24
150.0000 mg | ORAL_CAPSULE | Freq: Every day | ORAL | Status: DC
  Administered 2023-03-12 – 2023-03-14 (×3): 150 mg via ORAL
  Filled 2023-03-12 (×3): qty 2

## 2023-03-12 MED ORDER — MELATONIN 5 MG PO TABS
5.0000 mg | ORAL_TABLET | Freq: Every evening | ORAL | Status: DC | PRN
  Administered 2023-03-12: 5 mg via ORAL
  Filled 2023-03-12 (×3): qty 1

## 2023-03-12 NOTE — TOC Initial Note (Signed)
Transition of Care [facility]) - Initial/Assessment Note    Patient Details  Name: Sierra Lyons MRN: [id number] Date of Birth: 22-Dec-1960  [facility]) CM/SW Contact:    Gordy Clement, RN Phone Number: 03/12/2023, 12:14 PM  Clinical Narrative:     CM met with patient bedside.  She was accompanied by her Husband and Daughter.  Patient lives at home with her Husband.  Patient requires no DME and has never had home health. PCP is established. Do not anticipate any TOC needs. Will continue to follow                 Expected Discharge Plan: Home/Self Care Barriers to Discharge: Continued Medical Work up   Patient Goals and CMS Choice Patient states their goals for this hospitalization and ongoing recovery are:: Go home CMS Medicare.gov Compare Post Acute Care list provided to:: Other (Comment Required) (N/A) Choice offered to / list presented to : NA      Expected Discharge Plan and Services In-house Referral: NA Discharge Planning Services: NA Post Acute Care Choice: NA Living arrangements for the past 2 months: Single Family Home                 DME Arranged: N/A DME Agency: NA       HH Arranged: NA HH Agency: NA        Prior Living Arrangements/Services Living arrangements for the past 2 months: Single Family Home Lives with:: Spouse   Do you feel safe going back to the place where you live?: Yes      Need for Family Participation in Patient Care: No (Comment) Care giver support system in place?: Yes (comment) Current home services: Housekeeping (NONE) Criminal Activity/Legal Involvement Pertinent to Current Situation/Hospitalization: No - Comment as needed  Activities of Daily Living Home Assistive Devices/Equipment: None ADL Screening (condition at time of admission) Patient's cognitive ability adequate to safely complete daily activities?: Yes Is the patient deaf or have difficulty hearing?: No Does the patient have difficulty seeing, even when  wearing glasses/contacts?: No Does the patient have difficulty concentrating, remembering, or making decisions?: No Patient able to express need for assistance with ADLs?: Yes Does the patient have difficulty dressing or bathing?: No Independently performs ADLs?: Yes (appropriate for developmental age) Does the patient have difficulty walking or climbing stairs?: No Weakness of Legs: None Weakness of Arms/Hands: None  Permission Sought/Granted Permission sought to share information with : Case Manager Permission granted to share information with : Yes, Verbal Permission Granted        Permission granted to share info w Relationship: CM     Emotional Assessment Appearance:: Appears stated age Attitude/Demeanor/Rapport: Gracious Affect (typically observed): Stable, Pleasant Orientation: : Oriented to Place, Oriented to  Time, Oriented to Situation Alcohol / Substance Use: Not Applicable Psych Involvement: No (comment)  Admission diagnosis:  Dehydration [E86.0] Sepsis due to pneumonia (HCC) [J18.9, A41.9] Patient Active Problem List   Diagnosis Date Noted   Sepsis (HCC) 03/10/2023   VSD (ventricular septal defect), muscular very small pick up on coronary CT angio 09/14/2021   Hyperlipidemia 08/31/2021   Vitamin D deficiency 08/31/2021   Osteopenia 08/01/2021   Atypical chest pain 07/15/2021   Dyspnea on exertion 07/15/2021   Diabetes mellitus without complication (HCC) 07/11/2021   Chronic rhinitis 07/11/2021   Type 1 diabetes mellitus with hyperglycemia (HCC) 07/20/2020   Latent autoimmune diabetes in adults (LADA), managed as type 1 (HCC) 02/06/2020   Dyslipidemia 02/06/2020   Overweight  01/05/2016   Moderate persistent asthma 09/07/2015   Allergic rhinitis 08/10/2015   PCP:  [name], MD Pharmacy:   [id number] - [place], [place] - [address] AT [facility] OF [address] [place] [address] Phone: [phone] Fax:  [phone]  [facility] #[id number] - [place], [place] - [address] AT [facility] OF [facility] [address] [place] [address] Phone: [phone] Fax: [phone]  [facility] [place] (New Address) - [place], [place] - [address] [facility] AT Previously: [place], [place] [address] [place] [address] Phone: [phone] Fax: [phone]  [address] - [place], [place] - [phone] [place] [address] Phone: [phone] Fax: [phone]  [facility] - [place], [place] - [address] [place] [address] Phone: [phone] Fax: [phone]     Social Determinants of Health (SDOH) Social History: SDOH Screenings   Food Insecurity: No Food Insecurity (03/10/2023)  Housing: Low Risk  (03/10/2023)  Transportation Needs: No Transportation Needs (03/10/2023)  Utilities: Not At Risk (03/10/2023)  Depression (PHQ2-9): Medium Risk (11/04/2020)  Tobacco Use: Low Risk  (03/10/2023)   SDOH Interventions:     Readmission Risk Interventions     No data to display

## 2023-03-12 NOTE — Progress Notes (Signed)
SATURATION QUALIFICATIONS: (This note is used to comply with regulatory documentation for home oxygen)  Patient Saturations on Room Air at Rest = 96%  Patient Saturations on Room Air while Ambulating = 92%  Patient Saturations on N/A Liters of oxygen while Ambulating = N/A%  Please briefly explain why patient needs home oxygen: Pt did not require O2.

## 2023-03-12 NOTE — Progress Notes (Signed)
PROGRESS NOTE                                                                                                                                                                                                             Patient Demographics:    Sierra Lyons, is a 62 y.o. female, DOB - 1961-06-26, ZOX:[id number]  Outpatient Primary MD for the patient is Copland, Sierra Found, MD    LOS - 2  Admit date - 03/10/2023    Chief Complaint  Patient presents with   Loss of Consciousness   Dizziness   Emesis       Brief Narrative (HPI from H&P)     62 year old female with past medical history diabetes mellitus type 1, chronic rhinitis, moderate persistent asthma, HLD presented with dizziness and LOC while gardening.  When she recovered consciousness she was nauseous and vomiting.  She states she was very weak, had a headache, her chest was hurting and she Lyons it difficult to breathe.  She was wheezing significantly and she had generalized recurrent chills, however further workup was consistent with sepsis due to pneumonia and admitted to the hospital.   Subjective:    [name] today has, No headache, No chest pain, No abdominal pain - No Nausea, No new weakness tingling or numbness, improved cough and SOB.   Assessment  & Plan :    Sepsis due to aspiration pneumonia.  Her presentation is consistent with possible heatstroke working in the yard causing weakness, dehydration nausea vomiting and subsequent aspiration pneumonia.  CT abdomen pelvis nonacute, she has been placed on antibiotics, blood cultures negative, sepsis pathophysiology has resolved, continue azithromycin switch Rocephin to Unasyn.  Titrate down steroids, follow culture results and monitor.  Dehydration, AKI, possible heatstroke, hypokalemia.  Hydrated with IV fluids, AKI dehydration clinically resolved, potassium replaced, advance activity and  monitor.  Dyslipidemia.  On statin.  Depression.  Pharmacy to confirm home medication and resume.  DM type I.  Continue present insulin regimen and monitor.  Lab Results  Component Value Date   HGBA1C 6.2 (H) 03/10/2023   CBG (last 3)  Recent Labs    03/11/23 1636 03/11/23 2123 03/12/23 0751  GLUCAP 152* 115* 129*         Condition - Fair  Family Communication  : None present  Code Status :  Full  Consults  :  None  PUD Prophylaxis :    Procedures  :     CT abdomen pelvis and CT head.  Nonacute      Disposition Plan  :    Status is: Inpatient  DVT Prophylaxis  :    enoxaparin (LOVENOX) injection 40 mg Start: 03/10/23 2130   Lab Results  Component Value Date   PLT 200 03/12/2023    Diet :  Diet Order             Diet Carb Modified Fluid consistency: Thin; Room service appropriate? Yes  Diet effective now                    Inpatient Medications  Scheduled Meds:  atorvastatin  20 mg Oral Daily   enoxaparin (LOVENOX) injection  40 mg Subcutaneous Q24H   insulin aspart  0-15 Units Subcutaneous TID WC   insulin aspart  0-5 Units Subcutaneous QHS   insulin glargine-yfgn  6 Units Subcutaneous QHS   montelukast  10 mg Oral QHS   [START ON 03/13/2023] predniSONE  20 mg Oral Q breakfast   venlafaxine XR  150 mg Oral Q breakfast   Continuous Infusions:  ampicillin-sulbactam (UNASYN) IV     azithromycin 500 mg (03/11/23 2123)   PRN Meds:.albuterol, melatonin    Objective:   Vitals:   03/12/23 0000 03/12/23 0319 03/12/23 0400 03/12/23 0751  BP: 111/70 115/70 112/64   Pulse: 91 87 79   Resp: 15 16 20    Temp: 98.2 F (36.8 C) 98.2 F (36.8 C)  99.1 F (37.3 C)  TempSrc: Oral Oral  Oral  SpO2: 96% 95% 95%   Weight:      Height:        Wt Readings from Last 3 Encounters:  03/10/23 70.3 kg  06/28/22 77.1 kg  01/25/22 83.4 kg     Intake/Output Summary (Last 24 hours) at 03/12/2023 0925 Last data filed at 03/12/2023 0600 Gross  per 24 hour  Intake 1443.42 ml  Output 950 ml  Net 493.42 ml     Physical Exam  Awake Alert, No new F.N deficits, Normal affect [place].AT,PERRAL Supple Neck, No JVD,   Symmetrical Chest wall movement, Good air movement bilaterally, CTAB RRR,No Gallops,Rubs or new Murmurs,  +ve B.Sounds, Abd Soft, No tenderness,   No Cyanosis, Clubbing or edema       Data Review:    Recent Labs  Lab 03/10/23 1538 03/10/23 1604 03/11/23 0357 03/12/23 0320  WBC 8.0  --  12.3* 14.4*  HGB 13.0 13.6 11.0* 11.5*  HCT 39.9 40.0 33.9* 34.6*  PLT 230  --  190 200  MCV 93.2  --  91.6 92.0  MCH 30.4  --  29.7 30.6  MCHC 32.6  --  32.4 33.2  RDW 13.2  --  13.7 13.8  LYMPHSABS 2.6  --  0.5*  --   MONOABS 0.3  --  0.5  --   EOSABS 0.0  --  0.0  --   BASOSABS 0.0  --  0.0  --     Recent Labs  Lab 03/10/23 1538 03/10/23 1539 03/10/23 1604 03/10/23 1850 03/10/23 2048 03/11/23 0357 03/11/23 0939 03/11/23 1217 03/12/23 0320 03/12/23 0617  NA 140  --  143 137  --  133*  --   --  141  --   K 2.7*  --  2.7* 3.5  --  4.3  --   --  3.8  --  CL 106  --   --  101  --  103  --   --  102  --   CO2 21*  --   --  21*  --  22  --   --  22  --   ANIONGAP 13  --   --  15  --  8  --   --  17*  --   GLUCOSE 222*  --   --  276*  --  308*  --   --  139*  --   BUN 13  --   --  13  --  10  --   --  17  --   CREATININE 1.01*  --   --  0.92  --  0.76  --   --  0.63  --   AST 22  --   --   --   --   --   --   --   --   --   ALT 19  --   --   --   --   --   --   --   --   --   ALKPHOS 47  --   --   --   --   --   --   --   --   --   BILITOT 0.5  --   --   --   --   --   --   --   --   --   ALBUMIN 3.3*  --   --   --   --   --   --   --   --   --   CRP  --   --   --   --   --   --   --   --   --  7.0*  PROCALCITON  --   --   --   --   --   --   --   --   --  4.47  LATICACIDVEN  --  3.5*  --  4.3*  --   --  2.6* 1.4  --   --   HGBA1C  --   --   --   --  6.2*  --   --   --   --   --   BNP  --   --   --   --   --    --   --   --   --  152.4*  MG  --   --   --   --   --  1.5*  --   --   --  1.9  CALCIUM 8.2*  --   --  8.7*  --  8.3*  --   --  9.5  --      Recent Labs  Lab 03/10/23 1538 03/10/23 1539 03/10/23 1850 03/10/23 2048 03/11/23 0357 03/11/23 0939 03/11/23 1217 03/12/23 0320 03/12/23 0617  CRP  --   --   --   --   --   --   --   --  7.0*  PROCALCITON  --   --   --   --   --   --   --   --  4.47  LATICACIDVEN  --  3.5* 4.3*  --   --  2.6* 1.4  --   --   HGBA1C  --   --   --  6.2*  --   --   --   --   --   BNP  --   --   --   --   --   --   --   --  152.4*  MG  --   --   --   --  1.5*  --   --   --  1.9  CALCIUM 8.2*  --  8.7*  --  8.3*  --   --  9.5  --     Radiology Reports DG Chest Port 1 View  Result Date: 03/12/2023 CLINICAL DATA:  [id number] with shortness of breath, hypoxia on room air. EXAM: PORTABLE CHEST 1 VIEW COMPARISON:  Portable chest 03/10/2023 FINDINGS: The heart size and mediastinal contours are within normal limits. Both lungs are clear of infiltrates. There is a calcified granuloma in the right lung mid perihilar area. The visualized skeletal structures are unremarkable. IMPRESSION: No active disease. Electronically Signed   By: Almira Bar M.D.   On: 03/12/2023 06:32   CT Head Wo Contrast  Result Date: 03/10/2023 CLINICAL DATA:  Syncope/presyncope EXAM: CT HEAD WITHOUT CONTRAST TECHNIQUE: Contiguous axial images were obtained from the base of the skull through the vertex without intravenous contrast. RADIATION DOSE REDUCTION: This exam was performed according to the departmental dose-optimization program which includes automated exposure control, adjustment of the mA and/or kV according to patient size and/or use of iterative reconstruction technique. COMPARISON:  None Available. FINDINGS: Brain: No acute intracranial findings are seen and noncontrast CT brain. There are no signs of bleeding within the cranium. Ventricles are unremarkable. Cortical sulci are prominent.  Vascular: Unremarkable. Skull: No acute findings are seen. Sinuses/Orbits: There is mucosal thickening in ethmoid and maxillary sinuses. Other: There is increased amount of CSF in the sella suggesting partial empty sella. IMPRESSION: No acute intracranial findings are seen in noncontrast CT brain. Atrophy. Chronic sinusitis. Electronically Signed   By: Ernie Avena M.D.   On: 03/10/2023 18:03   CT ABDOMEN PELVIS W CONTRAST  Result Date: 03/10/2023 CLINICAL DATA:  Epigastric abdominal pain beginning today. Vomiting. Shortness of breath. EXAM: CT ABDOMEN AND PELVIS WITH CONTRAST TECHNIQUE: Multidetector CT imaging of the abdomen and pelvis was performed using the standard protocol following bolus administration of intravenous contrast. RADIATION DOSE REDUCTION: This exam was performed according to the departmental dose-optimization program which includes automated exposure control, adjustment of the mA and/or kV according to patient size and/or use of iterative reconstruction technique. CONTRAST:  75mL OMNIPAQUE IOHEXOL 350 MG/ML SOLN COMPARISON:  None Available. FINDINGS: Lower Chest: Multifocal ill-defined areas of airspace opacity are seen in both lower lungs consistent with atypical infectious or inflammatory etiology. No evidence of pleural effusion. Hepatobiliary: No hepatic masses identified. Gallbladder is unremarkable. No evidence of biliary ductal dilatation. Pancreas:  No mass or inflammatory changes. Spleen: Within normal limits in size and appearance. Adrenals/Urinary Tract: No suspicious masses identified. No evidence of ureteral calculi or hydronephrosis. Unremarkable unopacified urinary bladder. Stomach/Bowel: No evidence of obstruction, inflammatory process or abnormal fluid collections. Normal appendix visualized. Vascular/Lymphatic: No pathologically enlarged lymph nodes. No acute vascular findings. Reproductive: No mass or other significant abnormality. Previous tubal ligation clips  noted. Other:  None. Musculoskeletal:  No suspicious bone lesions identified. IMPRESSION: No acute findings within the abdomen or pelvis. Multifocal ill-defined areas of airspace opacity in both lower lungs, consistent with atypical infectious or inflammatory etiology. Electronically Signed   By: Danae Orleans M.D.   On: 03/10/2023 17:57  DG Chest Portable 1 View  Result Date: 03/10/2023 CLINICAL DATA:  Dyspnea.  Syncope.  Dizziness. EXAM: PORTABLE CHEST 1 VIEW COMPARISON:  01/17/2023 FINDINGS: The heart size and mediastinal contours are within normal limits. Both lungs are clear. The visualized skeletal structures are unremarkable. IMPRESSION: No active disease. Electronically Signed   By: Danae Orleans M.D.   On: 03/10/2023 16:11      Signature  -   Susa Raring M.D on 03/12/2023 at 9:25 AM   -  To page go to [email or web address]

## 2023-03-13 DIAGNOSIS — A419 Sepsis, unspecified organism: Secondary | ICD-10-CM | POA: Diagnosis not present

## 2023-03-13 LAB — CBC WITH DIFFERENTIAL/PLATELET
Abs Immature Granulocytes: 0.04 10*3/uL (ref 0.00–0.07)
Basophils Absolute: 0 10*3/uL (ref 0.0–0.1)
Basophils Relative: 0 %
Eosinophils Absolute: 0 10*3/uL (ref 0.0–0.5)
Eosinophils Relative: 0 %
HCT: 34.1 % — ABNORMAL LOW (ref 36.0–46.0)
Hemoglobin: 11.2 g/dL — ABNORMAL LOW (ref 12.0–15.0)
Immature Granulocytes: 0 %
Lymphocytes Relative: 22 %
Lymphs Abs: 2.5 10*3/uL (ref 0.7–4.0)
MCH: 29.6 pg (ref 26.0–34.0)
MCHC: 32.8 g/dL (ref 30.0–36.0)
MCV: 90.2 fL (ref 80.0–100.0)
Monocytes Absolute: 0.6 10*3/uL (ref 0.1–1.0)
Monocytes Relative: 5 %
Neutro Abs: 8.3 10*3/uL — ABNORMAL HIGH (ref 1.7–7.7)
Neutrophils Relative %: 73 %
Platelets: 203 10*3/uL (ref 150–400)
RBC: 3.78 MIL/uL — ABNORMAL LOW (ref 3.87–5.11)
RDW: 13.4 % (ref 11.5–15.5)
WBC: 11.4 10*3/uL — ABNORMAL HIGH (ref 4.0–10.5)
nRBC: 0 % (ref 0.0–0.2)

## 2023-03-13 LAB — BASIC METABOLIC PANEL
Anion gap: 8 (ref 5–15)
BUN: 16 mg/dL (ref 6–20)
CO2: 26 mmol/L (ref 22–32)
Calcium: 9 mg/dL (ref 8.9–10.3)
Chloride: 104 mmol/L (ref 98–111)
Creatinine, Ser: 0.74 mg/dL (ref 0.44–1.00)
GFR, Estimated: >60 mL/min (ref >60)
Glucose, Bld: 173 mg/dL — ABNORMAL HIGH (ref 70–99)
Potassium: 3.7 mmol/L (ref 3.5–5.1)
Sodium: 138 mmol/L (ref 135–145)

## 2023-03-13 LAB — BRAIN NATRIURETIC PEPTIDE: B Natriuretic Peptide: 79.7 pg/mL (ref 0.0–100.0)

## 2023-03-13 LAB — MAGNESIUM: Magnesium: 1.7 mg/dL (ref 1.7–2.4)

## 2023-03-13 LAB — GLUCOSE, CAPILLARY
Glucose-Capillary: 101 mg/dL — ABNORMAL HIGH (ref 70–99)
Glucose-Capillary: 258 mg/dL — ABNORMAL HIGH (ref 70–99)
Glucose-Capillary: 275 mg/dL — ABNORMAL HIGH (ref 70–99)
Glucose-Capillary: 227 mg/dL — ABNORMAL HIGH (ref 70–99)

## 2023-03-13 LAB — C-REACTIVE PROTEIN: CRP: 3.9 mg/dL — ABNORMAL HIGH (ref <1.0)

## 2023-03-13 LAB — PROCALCITONIN: Procalcitonin: 2.71 ng/mL

## 2023-03-13 MED ORDER — ZOLPIDEM TARTRATE 5 MG PO TABS: 5.0000 mg | ORAL_TABLET | Freq: Once | ORAL | Status: DC

## 2023-03-13 MED ORDER — TRAZODONE HCL 50 MG PO TABS
50.0000 mg | ORAL_TABLET | Freq: Once | ORAL | Status: AC
  Administered 2023-03-13: 50 mg via ORAL
  Filled 2023-03-13: qty 1

## 2023-03-13 NOTE — Progress Notes (Signed)
PROGRESS NOTE                                                                                                                                                                                                             Patient Demographics:    Sierra Lyons, is a 62 y.o. female, DOB - 1961/06/03, WUJ:[id number]  Outpatient Primary MD for the patient is Copland, [name], MD    LOS - 3  Admit date - 03/10/2023    Chief Complaint  Patient presents with   Loss of Consciousness   Dizziness   Emesis       Brief Narrative (HPI from H&P)     62 year old female with past medical history diabetes mellitus type 1, chronic rhinitis, moderate persistent asthma, HLD presented with dizziness and LOC while gardening.  When she recovered consciousness she was nauseous and vomiting.  She states she was very weak, had a headache, her chest was hurting and she found it difficult to breathe.  She was wheezing significantly and she had generalized recurrent chills, however further workup was consistent with sepsis due to pneumonia and admitted to the hospital.   Subjective:    Patient in bed, appears comfortable, denies any headache, no fever, no chest pain or pressure, no shortness of breath , no abdominal pain. No new focal weakness.   Assessment  & Plan :    Sepsis due to aspiration pneumonia.  Her presentation is consistent with possible heatstroke working in the yard causing weakness, dehydration nausea vomiting and subsequent aspiration pneumonia.  CT abdomen pelvis nonacute, she has been placed on antibiotics, blood cultures negative, sepsis pathophysiology has resolved, continue azithromycin switch Rocephin to Unasyn.  Titrate down steroids, follow culture results and monitor.  Dehydration, AKI, possible heatstroke, hypokalemia.  Hydrated with IV fluids, AKI dehydration clinically resolved, potassium replaced, advance activity and  monitor.  Dyslipidemia.  On statin.  Depression.  Pharmacy to confirm home medication and resume.  DM type I.  Continue present insulin regimen and monitor.  Lab Results  Component Value Date   HGBA1C 6.2 (H) 03/10/2023   CBG (last 3)  Recent Labs    03/12/23 1202 03/12/23 1613 03/12/23 2104  GLUCAP 139* 203* 199*         Condition - Fair  Family Communication  : None present  Code Status :  Full  Consults  :  None  PUD Prophylaxis :    Procedures  :     CT abdomen pelvis and CT head.  Nonacute      Disposition Plan  :    Status is: Inpatient  DVT Prophylaxis  :    enoxaparin (LOVENOX) injection 40 mg Start: 03/10/23 2130   Lab Results  Component Value Date   PLT 203 03/13/2023    Diet :  Diet Order             Diet Carb Modified Fluid consistency: Thin; Room service appropriate? Yes  Diet effective now                    Inpatient Medications  Scheduled Meds:  atorvastatin  20 mg Oral Daily   enoxaparin (LOVENOX) injection  40 mg Subcutaneous Q24H   insulin aspart  0-15 Units Subcutaneous TID WC   insulin aspart  0-5 Units Subcutaneous QHS   insulin glargine-yfgn  6 Units Subcutaneous QHS   montelukast  10 mg Oral QHS   predniSONE  10 mg Oral Q breakfast   venlafaxine XR  150 mg Oral Q breakfast   Continuous Infusions:  ampicillin-sulbactam (UNASYN) IV 3 g (03/13/23 0447)   azithromycin 500 mg (03/12/23 2126)   PRN Meds:.albuterol, melatonin    Objective:   Vitals:   03/12/23 0751 03/12/23 1202 03/12/23 2042 03/13/23 0000  BP:   110/65 100/63  Pulse:   78 95  Resp:   17 20  Temp: 99.1 F (37.3 C) 98.4 F (36.9 C)    TempSrc: Oral Oral  Oral  SpO2:  93% 97% 92%  Weight:      Height:        Wt Readings from Last 3 Encounters:  03/10/23 70.3 kg  06/28/22 77.1 kg  01/25/22 83.4 kg     Intake/Output Summary (Last 24 hours) at 03/13/2023 0851 Last data filed at 03/13/2023 0447 Gross per 24 hour  Intake --   Output 800 ml  Net -800 ml     Physical Exam  Awake Alert, No new F.N deficits, Normal affect [place].AT,PERRAL Supple Neck, No JVD,   Symmetrical Chest wall movement, Good air movement bilaterally, CTAB RRR,No Gallops,Rubs or new Murmurs,  +ve B.Sounds, Abd Soft, No tenderness,   No Cyanosis, Clubbing or edema       Data Review:    Recent Labs  Lab 03/10/23 1538 03/10/23 1604 03/11/23 0357 03/12/23 0320 03/13/23 0311  WBC 8.0  --  12.3* 14.4* 11.4*  HGB 13.0 13.6 11.0* 11.5* 11.2*  HCT 39.9 40.0 33.9* 34.6* 34.1*  PLT 230  --  190 200 203  MCV 93.2  --  91.6 92.0 90.2  MCH 30.4  --  29.7 30.6 29.6  MCHC 32.6  --  32.4 33.2 32.8  RDW 13.2  --  13.7 13.8 13.4  LYMPHSABS 2.6  --  0.5*  --  2.5  MONOABS 0.3  --  0.5  --  0.6  EOSABS 0.0  --  0.0  --  0.0  BASOSABS 0.0  --  0.0  --  0.0    Recent Labs  Lab 03/10/23 1538 03/10/23 1539 03/10/23 1604 03/10/23 1850 03/10/23 2048 03/11/23 0357 03/11/23 0939 03/11/23 1217 03/12/23 0320 03/12/23 0617 03/13/23 0311  NA 140  --  143 137  --  133*  --   --  141  --  138  K 2.7*  --  2.7* 3.5  --  4.3  --   --  3.8  --  3.7  CL 106  --   --  101  --  103  --   --  102  --  104  CO2 21*  --   --  21*  --  22  --   --  22  --  26  ANIONGAP 13  --   --  15  --  8  --   --  17*  --  8  GLUCOSE 222*  --   --  276*  --  308*  --   --  139*  --  173*  BUN 13  --   --  13  --  10  --   --  17  --  16  CREATININE 1.01*  --   --  0.92  --  0.76  --   --  0.63  --  0.74  AST 22  --   --   --   --   --   --   --   --   --   --   ALT 19  --   --   --   --   --   --   --   --   --   --   ALKPHOS 47  --   --   --   --   --   --   --   --   --   --   BILITOT 0.5  --   --   --   --   --   --   --   --   --   --   ALBUMIN 3.3*  --   --   --   --   --   --   --   --   --   --   CRP  --   --   --   --   --   --   --   --   --  7.0* 3.9*  PROCALCITON  --   --   --   --   --   --   --   --   --  4.47 2.71  LATICACIDVEN  --  3.5*  --  4.3*   --   --  2.6* 1.4  --   --   --   HGBA1C  --   --   --   --  6.2*  --   --   --   --   --   --   BNP  --   --   --   --   --   --   --   --   --  152.4* 79.7  MG  --   --   --   --   --  1.5*  --   --   --  1.9 1.7  CALCIUM 8.2*  --   --  8.7*  --  8.3*  --   --  9.5  --  9.0     Recent Labs  Lab 03/10/23 1538 03/10/23 1539 03/10/23 1850 03/10/23 2048 03/11/23 0357 03/11/23 0939 03/11/23 1217 03/12/23 0320 03/12/23 0617 03/13/23 0311  CRP  --   --   --   --   --   --   --   --  7.0* 3.9*  PROCALCITON  --   --   --   --   --   --   --   --  4.47 2.71  LATICACIDVEN  --  3.5* 4.3*  --   --  2.6* 1.4  --   --   --   HGBA1C  --   --   --  6.2*  --   --   --   --   --   --   BNP  --   --   --   --   --   --   --   --  152.4* 79.7  MG  --   --   --   --  1.5*  --   --   --  1.9 1.7  CALCIUM 8.2*  --  8.7*  --  8.3*  --   --  9.5  --  9.0    Radiology Reports DG Chest Port 1 View  Result Date: 03/12/2023 CLINICAL DATA:  [id number] with shortness of breath, hypoxia on room air. EXAM: PORTABLE CHEST 1 VIEW COMPARISON:  Portable chest 03/10/2023 FINDINGS: The heart size and mediastinal contours are within normal limits. Both lungs are clear of infiltrates. There is a calcified granuloma in the right lung mid perihilar area. The visualized skeletal structures are unremarkable. IMPRESSION: No active disease. Electronically Signed   By: Almira Bar M.D.   On: 03/12/2023 06:32   CT Head Wo Contrast  Result Date: 03/10/2023 CLINICAL DATA:  Syncope/presyncope EXAM: CT HEAD WITHOUT CONTRAST TECHNIQUE: Contiguous axial images were obtained from the base of the skull through the vertex without intravenous contrast. RADIATION DOSE REDUCTION: This exam was performed according to the departmental dose-optimization program which includes automated exposure control, adjustment of the mA and/or kV according to patient size and/or use of iterative reconstruction technique. COMPARISON:  None Available.  FINDINGS: Brain: No acute intracranial findings are seen and noncontrast CT brain. There are no signs of bleeding within the cranium. Ventricles are unremarkable. Cortical sulci are prominent. Vascular: Unremarkable. Skull: No acute findings are seen. Sinuses/Orbits: There is mucosal thickening in ethmoid and maxillary sinuses. Other: There is increased amount of CSF in the sella suggesting partial empty sella. IMPRESSION: No acute intracranial findings are seen in noncontrast CT brain. Atrophy. Chronic sinusitis. Electronically Signed   By: Ernie Avena M.D.   On: 03/10/2023 18:03   CT ABDOMEN PELVIS W CONTRAST  Result Date: 03/10/2023 CLINICAL DATA:  Epigastric abdominal pain beginning today. Vomiting. Shortness of breath. EXAM: CT ABDOMEN AND PELVIS WITH CONTRAST TECHNIQUE: Multidetector CT imaging of the abdomen and pelvis was performed using the standard protocol following bolus administration of intravenous contrast. RADIATION DOSE REDUCTION: This exam was performed according to the departmental dose-optimization program which includes automated exposure control, adjustment of the mA and/or kV according to patient size and/or use of iterative reconstruction technique. CONTRAST:  75mL OMNIPAQUE IOHEXOL 350 MG/ML SOLN COMPARISON:  None Available. FINDINGS: Lower Chest: Multifocal ill-defined areas of airspace opacity are seen in both lower lungs consistent with atypical infectious or inflammatory etiology. No evidence of pleural effusion. Hepatobiliary: No hepatic masses identified. Gallbladder is unremarkable. No evidence of biliary ductal dilatation. Pancreas:  No mass or inflammatory changes. Spleen: Within normal limits in size and appearance. Adrenals/Urinary Tract: No suspicious masses identified. No evidence of ureteral calculi or hydronephrosis. Unremarkable unopacified urinary bladder. Stomach/Bowel: No evidence of obstruction, inflammatory process or abnormal fluid collections. Normal  appendix visualized. Vascular/Lymphatic: No pathologically enlarged lymph nodes. No acute vascular findings. Reproductive: No mass or other significant abnormality. Previous tubal ligation clips noted. Other:  None. Musculoskeletal:  No suspicious bone  lesions identified. IMPRESSION: No acute findings within the abdomen or pelvis. Multifocal ill-defined areas of airspace opacity in both lower lungs, consistent with atypical infectious or inflammatory etiology. Electronically Signed   By: Danae Orleans M.D.   On: 03/10/2023 17:57   DG Chest Portable 1 View  Result Date: 03/10/2023 CLINICAL DATA:  Dyspnea.  Syncope.  Dizziness. EXAM: PORTABLE CHEST 1 VIEW COMPARISON:  01/17/2023 FINDINGS: The heart size and mediastinal contours are within normal limits. Both lungs are clear. The visualized skeletal structures are unremarkable. IMPRESSION: No active disease. Electronically Signed   By: Danae Orleans M.D.   On: 03/10/2023 16:11      Signature  -   Susa Raring M.D on 03/13/2023 at 8:51 AM   -  To page go to [email or web address]

## 2023-03-14 ENCOUNTER — Other Ambulatory Visit (HOSPITAL_COMMUNITY): Payer: Self-pay

## 2023-03-14 DIAGNOSIS — A419 Sepsis, unspecified organism: Secondary | ICD-10-CM | POA: Diagnosis not present

## 2023-03-14 LAB — BASIC METABOLIC PANEL
Anion gap: 9 (ref 5–15)
BUN: 17 mg/dL (ref 6–20)
CO2: 26 mmol/L (ref 22–32)
Calcium: 9 mg/dL (ref 8.9–10.3)
Chloride: 105 mmol/L (ref 98–111)
Creatinine, Ser: 0.57 mg/dL (ref 0.44–1.00)
GFR, Estimated: >60 mL/min (ref >60)
Glucose, Bld: 66 mg/dL — ABNORMAL LOW (ref 70–99)
Potassium: 3.7 mmol/L (ref 3.5–5.1)
Sodium: 140 mmol/L (ref 135–145)

## 2023-03-14 LAB — CBC WITH DIFFERENTIAL/PLATELET
Abs Immature Granulocytes: 0.04 10*3/uL (ref 0.00–0.07)
Basophils Absolute: 0 10*3/uL (ref 0.0–0.1)
Basophils Relative: 0 %
Eosinophils Absolute: 0.1 10*3/uL (ref 0.0–0.5)
Eosinophils Relative: 1 %
HCT: 34.7 % — ABNORMAL LOW (ref 36.0–46.0)
Hemoglobin: 11.5 g/dL — ABNORMAL LOW (ref 12.0–15.0)
Immature Granulocytes: 1 %
Lymphocytes Relative: 30 %
Lymphs Abs: 2.5 10*3/uL (ref 0.7–4.0)
MCH: 30.1 pg (ref 26.0–34.0)
MCHC: 33.1 g/dL (ref 30.0–36.0)
MCV: 90.8 fL (ref 80.0–100.0)
Monocytes Absolute: 0.6 10*3/uL (ref 0.1–1.0)
Monocytes Relative: 7 %
Neutro Abs: 5.1 10*3/uL (ref 1.7–7.7)
Neutrophils Relative %: 61 %
Platelets: 207 10*3/uL (ref 150–400)
RBC: 3.82 MIL/uL — ABNORMAL LOW (ref 3.87–5.11)
RDW: 13.2 % (ref 11.5–15.5)
WBC: 8.3 10*3/uL (ref 4.0–10.5)
nRBC: 0 % (ref 0.0–0.2)

## 2023-03-14 LAB — BRAIN NATRIURETIC PEPTIDE: B Natriuretic Peptide: 32.1 pg/mL (ref 0.0–100.0)

## 2023-03-14 LAB — C-REACTIVE PROTEIN: CRP: 2.2 mg/dL — ABNORMAL HIGH (ref <1.0)

## 2023-03-14 LAB — MAGNESIUM: Magnesium: 1.8 mg/dL (ref 1.7–2.4)

## 2023-03-14 LAB — PROCALCITONIN: Procalcitonin: 1.49 ng/mL

## 2023-03-14 LAB — CULTURE, BLOOD (ROUTINE X 2)

## 2023-03-14 MED ORDER — CEPHALEXIN 500 MG PO CAPS
500.0000 mg | ORAL_CAPSULE | Freq: Three times a day (TID) | ORAL | 0 refills | Status: DC
  Filled 2023-03-14: qty 9, 3d supply, fill #0

## 2023-03-14 MED ORDER — AZITHROMYCIN 500 MG PO TABS
500.0000 mg | ORAL_TABLET | Freq: Every day | ORAL | 0 refills | Status: DC
  Filled 2023-03-14: qty 3, 3d supply, fill #0

## 2023-03-14 MED ORDER — AMOXICILLIN-POT CLAVULANATE 875-125 MG PO TABS
1.0000 | ORAL_TABLET | Freq: Two times a day (BID) | ORAL | 0 refills | Status: DC
  Filled 2023-03-14: qty 8, 4d supply, fill #0

## 2023-03-14 MED ORDER — LACTATED RINGERS IV BOLUS
500.0000 mL | Freq: Once | INTRAVENOUS | Status: AC
  Administered 2023-03-14: 500 mL via INTRAVENOUS

## 2023-03-14 NOTE — Discharge Summary (Signed)
Sierra Lyons BJY:[id number] DOB: [birth year] DOA: 03/10/2023  PCP: Sierra Cables, MD  Admit date: 03/10/2023  Discharge date: 03/14/2023  Admitted From: Home   Disposition:  Home   Recommendations for Outpatient Follow-up:   Follow up with PCP in 1-2 weeks  PCP Please obtain BMP/CBC, 2 view CXR in 1week,  (see Discharge instructions)   PCP Please follow up on the following pending results:    Home Health: None   Equipment/Devices: None  Consultations: None  Discharge Condition: Stable    CODE STATUS: Full    Diet Recommendation: Heart Healthy Low Carb    Chief Complaint  Patient presents with   Loss of Consciousness   Dizziness   Emesis     Brief history of present illness from the day of admission and additional interim summary    62 year old female with past medical history diabetes mellitus type 1, chronic rhinitis, moderate persistent asthma, HLD presented with dizziness and LOC while gardening. When she recovered consciousness she was nauseous and vomiting. She states she was very weak, had a headache, her chest was hurting and she found it difficult to breathe. She was wheezing significantly and she had generalized recurrent chills, however further workup was consistent with sepsis due to pneumonia and admitted to the hospital.                                                                  Hospital Course   Sepsis due to aspiration pneumonia.  Her presentation is consistent with possible heatstroke working in the yard causing weakness, dehydration nausea vomiting and subsequent aspiration pneumonia.  CT abdomen pelvis nonacute, she has been placed on empiric antibiotics with good improvement, cultures remain negative, now symptom-free eager to go home, will be placed on oral Augmentin and  azithromycin with outpatient follow-up with PCP in a week.   Dehydration, AKI, possible heatstroke, hypokalemia.  Hydrated with IV fluids, AKI and dehydration resolved, potassium was replaced.   Dyslipidemia.  On statin.   Depression.  Continue home regimen of venlafaxine.   DM type I.  Continue home regimen and follow-up with PCP for monitoring and adjustment.    Discharge diagnosis     Principal Problem:   Sepsis [facility]) Active Problems:   Moderate persistent asthma   Dyslipidemia   Diabetes mellitus without complication (HCC)   Chronic rhinitis    Discharge instructions    Discharge Instructions     Discharge instructions   Complete by: As directed    Follow with Primary MD Lyons, Sierra Found, MD in 7 days   Get CBC, CMP, 2 view Chest X ray -  checked next visit with your primary MD   Activity: As tolerated with Full fall precautions use walker/cane & assistance as needed  Disposition Home   Diet:  Heart Healthy, Low Carb diet .  Check your CBGs q. [facility]  Special Instructions: If you have smoked or chewed Tobacco  in the last 2 yrs please stop smoking, stop any regular Alcohol  and or any Recreational drug use.  On your next visit with your primary care physician please Get Medicines reviewed and adjusted.  Please request your Prim.MD to go over all Hospital Tests and Procedure/Radiological results at the follow up, please get all Hospital records sent to your Prim MD by signing hospital release before you go home.  If you experience worsening of your admission symptoms, develop shortness of breath, life threatening emergency, suicidal or homicidal thoughts you must seek medical attention immediately by calling 911 or calling your MD immediately  if symptoms less severe.  You Must read complete instructions/literature along with all the possible adverse reactions/side effects for all the Medicines you take and that have been prescribed to you. Take any new Medicines  after you have completely understood and accpet all the possible adverse reactions/side effects.   Increase activity slowly   Complete by: As directed        Discharge Medications   Allergies as of 03/14/2023       Reactions   Aspirin Swelling   Swelling of the face         Medication List     TAKE these medications    Airsupra 90-80 MCG/ACT Aero Generic drug: Albuterol-Budesonide Inhale 2 Inhalations into the lungs as needed. Maximum 12 puffs per day.   amoxicillin-clavulanate 875-125 MG tablet Commonly known as: AUGMENTIN Take 1 tablet by mouth 2 (two) times daily.   atorvastatin 20 MG tablet Commonly known as: LIPITOR Take 1 tablet (20 mg total) by mouth daily.   azithromycin 500 MG tablet Commonly known as: Zithromax Take 1 tablet (500 mg total) by mouth daily.   BD Pen Needle Nano 2nd Gen 32G X 4 MM Misc Generic drug: Insulin Pen Needle 2 times daily   Breztri Aerosphere 160-9-4.8 MCG/ACT Aero Generic drug: Budeson-Glycopyrrol-Formoterol 2 puffs twice daily with spacer to prevent coughing or wheezing. Rinse, gargle, and spit after use.   Flovent HFA 110 MCG/ACT inhaler Generic drug: fluticasone Inhale 2 puffs into the lungs 2 (two) times daily.   fluticasone 50 MCG/ACT nasal spray Commonly known as: FLONASE SHAKE LIQUID AND USE 1 SPRAY IN EACH NOSTRIL DAILY AS NEEDED FOR ALLERGIES   montelukast 10 MG tablet Commonly known as: Singulair Take 1 tablet (10 mg total) by mouth at bedtime.   Mounjaro 10 MG/0.5ML Pen Generic drug: tirzepatide SMARTSIG:10 Milligram(s) SUB-Q Once a Week   Tresiba FlexTouch 100 UNIT/ML FlexTouch Pen Generic drug: insulin degludec Inject 6 Units into the skin daily.   venlafaxine XR 150 MG 24 hr capsule Commonly known as: EFFEXOR-XR Take 150 mg by mouth daily with breakfast.         Follow-up Information     Lyons, Sierra Found, MD. Schedule an appointment as soon as possible for a visit in 1 week(s).   Specialty:  Family Medicine Contact information: [address] [place] [address] [phone]                 Major procedures and Radiology Reports - PLEASE review detailed and final reports thoroughly  -     DG Chest Port 1 View  Result Date: 03/12/2023 CLINICAL DATA:  [id number] with shortness of breath, hypoxia on room air. EXAM: PORTABLE CHEST 1 VIEW COMPARISON:  Portable chest 03/10/2023 FINDINGS: The heart size and mediastinal contours are within normal limits. Both lungs are clear of infiltrates. There is a calcified granuloma in the right lung mid perihilar area. The visualized skeletal structures are unremarkable. IMPRESSION: No active disease. Electronically Signed   By: Almira Bar M.D.   On: 03/12/2023 06:32   CT Head Wo Contrast  Result Date: 03/10/2023 CLINICAL DATA:  Syncope/presyncope EXAM: CT HEAD WITHOUT CONTRAST TECHNIQUE: Contiguous axial images were obtained from the base of the skull through the vertex without intravenous contrast. RADIATION DOSE REDUCTION: This exam was performed according to the departmental dose-optimization program which includes automated exposure control, adjustment of the mA and/or kV according to patient size and/or use of iterative reconstruction technique. COMPARISON:  None Available. FINDINGS: Brain: No acute intracranial findings are seen and noncontrast CT brain. There are no signs of bleeding within the cranium. Ventricles are unremarkable. Cortical sulci are prominent. Vascular: Unremarkable. Skull: No acute findings are seen. Sinuses/Orbits: There is mucosal thickening in ethmoid and maxillary sinuses. Other: There is increased amount of CSF in the sella suggesting partial empty sella. IMPRESSION: No acute intracranial findings are seen in noncontrast CT brain. Atrophy. Chronic sinusitis. Electronically Signed   By: Ernie Avena M.D.   On: 03/10/2023 18:03   CT ABDOMEN PELVIS W CONTRAST  Result Date: 03/10/2023 CLINICAL  DATA:  Epigastric abdominal pain beginning today. Vomiting. Shortness of breath. EXAM: CT ABDOMEN AND PELVIS WITH CONTRAST TECHNIQUE: Multidetector CT imaging of the abdomen and pelvis was performed using the standard protocol following bolus administration of intravenous contrast. RADIATION DOSE REDUCTION: This exam was performed according to the departmental dose-optimization program which includes automated exposure control, adjustment of the mA and/or kV according to patient size and/or use of iterative reconstruction technique. CONTRAST:  75mL OMNIPAQUE IOHEXOL 350 MG/ML SOLN COMPARISON:  None Available. FINDINGS: Lower Chest: Multifocal ill-defined areas of airspace opacity are seen in both lower lungs consistent with atypical infectious or inflammatory etiology. No evidence of pleural effusion. Hepatobiliary: No hepatic masses identified. Gallbladder is unremarkable. No evidence of biliary ductal dilatation. Pancreas:  No mass or inflammatory changes. Spleen: Within normal limits in size and appearance. Adrenals/Urinary Tract: No suspicious masses identified. No evidence of ureteral calculi or hydronephrosis. Unremarkable unopacified urinary bladder. Stomach/Bowel: No evidence of obstruction, inflammatory process or abnormal fluid collections. Normal appendix visualized. Vascular/Lymphatic: No pathologically enlarged lymph nodes. No acute vascular findings. Reproductive: No mass or other significant abnormality. Previous tubal ligation clips noted. Other:  None. Musculoskeletal:  No suspicious bone lesions identified. IMPRESSION: No acute findings within the abdomen or pelvis. Multifocal ill-defined areas of airspace opacity in both lower lungs, consistent with atypical infectious or inflammatory etiology. Electronically Signed   By: Danae Orleans M.D.   On: 03/10/2023 17:57   DG Chest Portable 1 View  Result Date: 03/10/2023 CLINICAL DATA:  Dyspnea.  Syncope.  Dizziness. EXAM: PORTABLE CHEST 1 VIEW  COMPARISON:  01/17/2023 FINDINGS: The heart size and mediastinal contours are within normal limits. Both lungs are clear. The visualized skeletal structures are unremarkable. IMPRESSION: No active disease. Electronically Signed   By: Danae Orleans M.D.   On: 03/10/2023 16:11    Micro Results    Recent Results (from the past 240 hour(s))  Culture, blood (Routine X 2) w Reflex to ID Panel     Status: None (Preliminary result)   Collection Time: 03/10/23  8:41 PM   Specimen: BLOOD  Result Value Ref Range Status   Specimen Description BLOOD  BLOOD LEFT FOREARM  Final   Special Requests   Final    BOTTLES DRAWN AEROBIC AND ANAEROBIC Blood Culture adequate volume   Culture   Final    NO GROWTH 4 DAYS Performed at [facility], [address] [place], [place] [address]    Report Status PENDING  Incomplete  Culture, blood (Routine X 2) w Reflex to ID Panel     Status: None (Preliminary result)   Collection Time: 03/10/23  8:48 PM   Specimen: BLOOD  Result Value Ref Range Status   Specimen Description BLOOD RIGHT ARM  Final   Special Requests   Final    BOTTLES DRAWN AEROBIC AND ANAEROBIC Blood Culture adequate volume   Culture   Final    NO GROWTH 4 DAYS Performed at [facility], [address] [place], [place] [address]    Report Status PENDING  Incomplete  SARS Coronavirus 2 by RT PCR (hospital order, performed in [facility] hospital lab) *cepheid single result test*     Status: None   Collection Time: 03/10/23 11:17 PM  Result Value Ref Range Status   SARS Coronavirus 2 by RT PCR NEGATIVE NEGATIVE Final    Comment: Performed at [facility], [address] [place], [place] [address]    Today   Subjective    Chrystie Dimos today has no headache,no chest abdominal pain,no new weakness tingling or numbness, feels much better wants to go home today.    Objective   Blood pressure 105/60, pulse 80, temperature 98.2 F (36.8 C), temperature source Oral, resp.  rate 20, height 5\' 5"  (1.651 m), weight 70.3 kg, SpO2 96 %.   Intake/Output Summary (Last 24 hours) at 03/14/2023 9147 Last data filed at 03/14/2023 0600 Gross per 24 hour  Intake 1064.49 ml  Output 1500 ml  Net -435.51 ml    Exam  Awake Alert, No new F.N deficits,    [place].AT,PERRAL Supple Neck,   Symmetrical Chest wall movement, Good air movement bilaterally, CTAB RRR,No Gallops,   +ve B.Sounds, Abd Soft, Non tender,  No Cyanosis, Clubbing or edema    Data Review   Recent Labs  Lab 03/10/23 1538 03/10/23 1604 03/11/23 0357 03/12/23 0320 03/13/23 0311 03/14/23 0240  WBC 8.0  --  12.3* 14.4* 11.4* 8.3  HGB 13.0 13.6 11.0* 11.5* 11.2* 11.5*  HCT 39.9 40.0 33.9* 34.6* 34.1* 34.7*  PLT 230  --  190 200 203 207  MCV 93.2  --  91.6 92.0 90.2 90.8  MCH 30.4  --  29.7 30.6 29.6 30.1  MCHC 32.6  --  32.4 33.2 32.8 33.1  RDW 13.2  --  13.7 13.8 13.4 13.2  LYMPHSABS 2.6  --  0.5*  --  2.5 2.5  MONOABS 0.3  --  0.5  --  0.6 0.6  EOSABS 0.0  --  0.0  --  0.0 0.1  BASOSABS 0.0  --  0.0  --  0.0 0.0    Recent Labs  Lab 03/10/23 1538 03/10/23 1539 03/10/23 1604 03/10/23 1850 03/10/23 2048 03/11/23 0357 03/11/23 0939 03/11/23 1217 03/12/23 0320 03/12/23 0617 03/13/23 0311 03/14/23 0240  NA 140  --    < > 137  --  133*  --   --  141  --  138 140  K 2.7*  --    < > 3.5  --  4.3  --   --  3.8  --  3.7 3.7  CL 106  --   --  101  --  103  --   --  102  --  104 105  CO2 21*  --   --  21*  --  22  --   --  22  --  26 26  ANIONGAP 13  --   --  15  --  8  --   --  17*  --  8 9  GLUCOSE 222*  --   --  276*  --  308*  --   --  139*  --  173* 66*  BUN 13  --   --  13  --  10  --   --  17  --  16 17  CREATININE 1.01*  --   --  0.92  --  0.76  --   --  0.63  --  0.74 0.57  AST 22  --   --   --   --   --   --   --   --   --   --   --   ALT 19  --   --   --   --   --   --   --   --   --   --   --   ALKPHOS 47  --   --   --   --   --   --   --   --   --   --   --   BILITOT 0.5  --    --   --   --   --   --   --   --   --   --   --   ALBUMIN 3.3*  --   --   --   --   --   --   --   --   --   --   --   CRP  --   --   --   --   --   --   --   --   --  7.0* 3.9* 2.2*  PROCALCITON  --   --   --   --   --   --   --   --   --  4.47 2.71 1.49  LATICACIDVEN  --  3.5*  --  4.3*  --   --  2.6* 1.4  --   --   --   --   HGBA1C  --   --   --   --  6.2*  --   --   --   --   --   --   --   BNP  --   --   --   --   --   --   --   --   --  152.4* 79.7 32.1  MG  --   --   --   --   --  1.5*  --   --   --  1.9 1.7 1.8  CALCIUM 8.2*  --   --  8.7*  --  8.3*  --   --  9.5  --  9.0 9.0   < > = values in this interval not displayed.    Total Time in preparing paper work, data evaluation and todays exam - 35 minutes  Signature  -    Susa Raring M.D on 03/14/2023 at 7:12 AM   -  To page go to [email or web address]

## 2023-03-14 NOTE — Discharge Instructions (Addendum)
Follow with Primary MD Copland, [name], MD in 7 days   Get CBC, CMP, 2 view Chest X ray -  checked next visit with your primary MD   Activity: As tolerated with Full fall precautions use walker/cane & assistance as needed  Disposition Home   Diet: Heart Healthy, Low Carb diet.  Check your CBGs q. [facility]  Special Instructions: If you have smoked or chewed Tobacco  in the last 2 yrs please stop smoking, stop any regular Alcohol  and or any Recreational drug use.  On your next visit with your primary care physician please Get Medicines reviewed and adjusted.  Please request your Prim.MD to go over all Hospital Tests and Procedure/Radiological results at the follow up, please get all Hospital records sent to your Prim MD by signing hospital release before you go home.  If you experience worsening of your admission symptoms, develop shortness of breath, life threatening emergency, suicidal or homicidal thoughts you must seek medical attention immediately by calling 911 or calling your MD immediately  if symptoms less severe.  You Must read complete instructions/literature along with all the possible adverse reactions/side effects for all the Medicines you take and that have been prescribed to you. Take any new Medicines after you have completely understood and accpet all the possible adverse reactions/side effects.

## 2023-03-14 NOTE — TOC Transition Note (Signed)
Transition of Care [facility]) - CM/SW Discharge Note   Patient Details  Name: Sierra Lyons MRN: [id number] Date of Birth: 07/30/1961  Transition of Care [facility]) CM/SW Contact:  Gordy Clement, RN Phone Number: 03/14/2023, 9:21 AM   Clinical Narrative:    Patient will DC to home today  Family to transport. No TOC needs identified       Barriers to Discharge: Continued Medical Work up   Patient Goals and CMS Choice CMS Medicare.gov Compare Post Acute Care list provided to:: Other (Comment Required) (N/A) Choice offered to / list presented to : NA  Discharge Placement                         Discharge Plan and Services Additional resources added to the After Visit Summary for   In-house Referral: NA Discharge Planning Services: NA Post Acute Care Choice: NA          DME Arranged: N/A DME Agency: NA       HH Arranged: NA HH Agency: NA        Social Determinants of Health (SDOH) Interventions SDOH Screenings   Food Insecurity: No Food Insecurity (03/10/2023)  Housing: Low Risk  (03/10/2023)  Transportation Needs: No Transportation Needs (03/10/2023)  Utilities: Not At Risk (03/10/2023)  Depression (PHQ2-9): Medium Risk (11/04/2020)  Tobacco Use: Low Risk  (03/10/2023)     Readmission Risk Interventions     No data to display

## 2023-03-15 ENCOUNTER — Telehealth: Payer: Self-pay

## 2023-03-15 LAB — CULTURE, BLOOD (ROUTINE X 2): Special Requests: ADEQUATE

## 2023-03-15 NOTE — Transitions of Care (Post Inpatient/ED Visit) (Signed)
03/15/2023  Name: Sierra Lyons MRN: [id number] DOB: 03-06-1961  Today's TOC FU Call Status: Today's TOC FU Call Status:: Successful TOC FU Call Competed TOC FU Call Complete Date: 03/15/23  Transition Care Management Follow-up Telephone Call Date of Discharge: 03/14/23 Discharge Facility: [facility]) Type of Discharge: Inpatient Admission Primary Inpatient Discharge Diagnosis:: dehydration How have you been since you were released from the hospital?: Better Any questions or concerns?: No  Items Reviewed: Did you receive and understand the discharge instructions provided?: No Medications obtained,verified, and reconciled?: Yes (Medications Reviewed) Any new allergies since your discharge?: No Dietary orders reviewed?: Yes Do you have support at home?: Yes People in Home: spouse  Medications Reviewed Today: Medications Reviewed Today     Reviewed by Karena Addison, LPN (Licensed Practical Nurse) on 03/15/23 at 0932  Med List Status: <None>   Medication Order Taking? Sig Documenting Provider Last Dose Status Informant  Albuterol-Budesonide (AIRSUPRA) 90-80 MCG/ACT AERO [id number] No Inhale 2 Inhalations into the lungs as needed. Maximum 12 puffs per day. Verlee Monte, MD Past Week Active Self  amoxicillin-clavulanate (AUGMENTIN) 875-125 MG tablet [id number]  Take 1 tablet by mouth 2 (two) times daily. Leroy Sea, MD  Active   atorvastatin (LIPITOR) 20 MG tablet [id number] No Take 1 tablet (20 mg total) by mouth daily. Copland, [name], MD 03/09/2023 Active Self  azithromycin (ZITHROMAX) 500 MG tablet [id number]  Take 1 tablet (500 mg total) by mouth daily. Leroy Sea, MD  Active   Budeson-Glycopyrrol-Formoterol (BREZTRI AEROSPHERE) 160-9-4.8 MCG/ACT Sandrea Matte [id number] No 2 puffs twice daily with spacer to prevent coughing or wheezing. Rinse, gargle, and spit after use. Verlee Monte, MD Past Month Active Self  FLOVENT HFA 110 MCG/ACT inhaler [id number] No Inhale 2  puffs into the lungs 2 (two) times daily. Verlee Monte, MD 03/09/2023 Active Self  fluticasone (FLONASE) 50 MCG/ACT nasal spray [id number] No SHAKE LIQUID AND USE 1 SPRAY IN EACH NOSTRIL DAILY AS NEEDED FOR ALLERGIES Verlee Monte, MD Past Week Active Self  insulin degludec (TRESIBA FLEXTOUCH) 100 UNIT/ML FlexTouch Pen [id number] No Inject 6 Units into the skin daily. [provider] 03/09/2023 Active Self  Insulin Pen Needle (BD PEN NEEDLE NANO 2ND GEN) 32G X 4 MM MISC [id number] No 2 times daily Shamleffer, Konrad Dolores, MD 03/09/2023 Active Self  mepolizumab (NUCALA) injection 100 mg [id number]   Verlee Monte, MD  Active   montelukast (SINGULAIR) 10 MG tablet [id number] No Take 1 tablet (10 mg total) by mouth at bedtime. Verlee Monte, MD 03/09/2023 Active Self  MOUNJARO 10 MG/0.5ML Pen [id number] No SMARTSIG:10 Milligram(s) SUB-Q Once a Week [provider] 03/08/2023 Active Self  venlafaxine XR (EFFEXOR-XR) 150 MG 24 hr capsule [id number] No Take 150 mg by mouth daily with breakfast. [provider] Past Week Active             Home Care and Equipment/Supplies: Were Home Health Services Ordered?: NA Any new equipment or medical supplies ordered?: NA  Functional Questionnaire: Do you need assistance with bathing/showering or dressing?: No Do you need assistance with meal preparation?: No Do you need assistance with eating?: No Do you have difficulty maintaining continence: No Do you need assistance with getting out of bed/getting out of a chair/moving?: No Do you have difficulty managing or taking your medications?: No  Follow up appointments reviewed: PCP Follow-up appointment confirmed?: Yes Date of PCP follow-up appointment?: 03/22/23 Follow-up Provider: [facility] Follow-up appointment confirmed?: NA Do you  need transportation to your follow-up appointment?: No Do you understand care options if your condition(s) worsen?: Yes-patient  verbalized understanding    SIGNATURE Karena Addison, LPN [facility] Nurse Health Advisor Direct Dial [phone]

## 2023-03-17 ENCOUNTER — Other Ambulatory Visit: Payer: Self-pay | Admitting: Family Medicine

## 2023-03-17 NOTE — Progress Notes (Addendum)
[facility] [address], [address] [place], [place] [address] [phone]  Date:  03/22/2023   Name:  Sierra Lyons   DOB:  Oct 17, 1960   MRN:  [id number]  PCP:  [name], MD    Chief Complaint: Hospitalization Follow-up (Dehydration/ Sepsis 03/10/23 - 03/14/23. Discharged with Augmentin and Zpack. Pt mentioned f/u labs and xray. She say she has a HA the last couple of days. )   History of Present Illness:  Sierra Lyons is a 62 y.o. very pleasant female patient who presents with the following:  Patient seen today for hospital follow-up I last saw her in January History of hyperlipidemia, asthma and allergies, LADA type diabetes She does have endocrinology care - Dr Rolly Salter in [place]  Since her last visit, she fell and fractured her patella in April-she was instructed to follow-up with orthopedics, they were able to treat this conservatively   She was then admitted to the hospital last week with dehydration-patient reports she was at work in her garden, she had not been out that long and did not think she was in distress.  However, she apparently fainted and then aspirated, prompting hospital admission for 4 nights Date of admission 6/15, date of discharge 03/14/2023: 62 year old female with past medical history diabetes mellitus type 1, chronic rhinitis, moderate persistent asthma, HLD presented with dizziness and LOC while gardening. When she recovered consciousness she was nauseous and vomiting. She states she was very weak, had a headache, her chest was hurting and she found it difficult to breathe. She was wheezing significantly and she had generalized recurrent chills, however further workup was consistent with sepsis due to pneumonia and admitted to the hospital.                                                                   Hospital Course    Sepsis due to aspiration pneumonia.  Her presentation is  consistent with possible heatstroke working in the yard causing weakness, dehydration nausea vomiting and subsequent aspiration pneumonia.  CT abdomen pelvis nonacute, she has been placed on empiric antibiotics with good improvement, cultures remain negative, now symptom-free eager to go home, will be placed on oral Augmentin and azithromycin with outpatient follow-up with PCP in a week. Dehydration, AKI, possible heatstroke, hypokalemia.  Hydrated with IV fluids, AKI and dehydration resolved, potassium was replaced. Dyslipidemia.  On statin. Depression.  Continue home regimen of venlafaxine. DM type I.  Continue home regimen and follow-up with PCP for monitoring and adjustment   Lab Results  Component Value Date   HGBA1C 6.2 (H) 03/10/2023   She is still feeling a bit worn out- she is gradually improving but not 100% back to normal yet  She finished up her abx No cough  Per discharge summary -PCP Please obtain BMP/CBC, 2 view CXR in 1week,  (see Discharge instructions)   Patient Active Problem List   Diagnosis Date Noted   Sepsis (HCC) 03/10/2023   VSD (ventricular septal defect), muscular very small pick up on coronary CT angio 09/14/2021   Hyperlipidemia 08/31/2021   Vitamin D deficiency 08/31/2021   Osteopenia 08/01/2021   Atypical chest pain 07/15/2021   Dyspnea on exertion 07/15/2021  Diabetes mellitus without complication (HCC) 07/11/2021   Chronic rhinitis 07/11/2021   Type 1 diabetes mellitus with hyperglycemia (HCC) 07/20/2020   Latent autoimmune diabetes in adults (LADA), managed as type 1 (HCC) 02/06/2020   Dyslipidemia 02/06/2020   Overweight 01/05/2016   Moderate persistent asthma 09/07/2015   Allergic rhinitis 08/10/2015    Past Medical History:  Diagnosis Date   Asthma    Chronic rhinitis around 1986   Diabetes mellitus without complication [facility])     Past Surgical History:  Procedure Laterality Date   CESAREAN SECTION  1993, 1997, 1999   VEIN SURGERY   2012    Social History   Tobacco Use   Smoking status: Never   Smokeless tobacco: Never  Vaping Use   Vaping Use: Never used  Substance Use Topics   Alcohol use: No   Drug use: No    Family History  Problem Relation Age of Onset   Diabetes Mother    Hypertension Mother    Allergic rhinitis Father    Allergic rhinitis Sister    Asthma Sister    Urticaria Sister    Diabetes Maternal Grandmother    Hypertension Maternal Grandmother     Allergies  Allergen Reactions   Aspirin Swelling    Swelling of the face     Medication list has been reviewed and updated.  Current Outpatient Medications on File Prior to Visit  Medication Sig Dispense Refill   Albuterol-Budesonide (AIRSUPRA) 90-80 MCG/ACT AERO Inhale 2 Inhalations into the lungs as needed. Maximum 12 puffs per day. 10.7 g 3   atorvastatin (LIPITOR) 20 MG tablet Take 1 tablet (20 mg total) by mouth daily. 90 tablet 3   Budeson-Glycopyrrol-Formoterol (BREZTRI AEROSPHERE) 160-9-4.8 MCG/ACT AERO 2 puffs twice daily with spacer to prevent coughing or wheezing. Rinse, gargle, and spit after use. 3 each 1   FLOVENT HFA 110 MCG/ACT inhaler Inhale 2 puffs into the lungs 2 (two) times daily. 1 each 5   fluticasone (FLONASE) 50 MCG/ACT nasal spray SHAKE LIQUID AND USE 1 SPRAY IN EACH NOSTRIL DAILY AS NEEDED FOR ALLERGIES 48 g 0   insulin degludec (TRESIBA FLEXTOUCH) 100 UNIT/ML FlexTouch Pen Inject 6 Units into the skin daily.     Insulin Pen Needle (BD PEN NEEDLE NANO 2ND GEN) 32G X 4 MM MISC 2 times daily 100 each 0   montelukast (SINGULAIR) 10 MG tablet Take 1 tablet (10 mg total) by mouth at bedtime. 90 tablet 1   MOUNJARO 10 MG/0.5ML Pen SMARTSIG:10 Milligram(s) SUB-Q Once a Week     venlafaxine XR (EFFEXOR-XR) 150 MG 24 hr capsule Take 1 capsule (150 mg total) by mouth daily with breakfast. 90 capsule 0   Current Facility-Administered Medications on File Prior to Visit  Medication Dose Route Frequency Provider Last Rate Last  Admin   mepolizumab (NUCALA) injection 100 mg  100 mg Subcutaneous Q28 days Verlee Monte, MD   100 mg at 07/26/22 1610    Review of Systems:  As per HPI- otherwise negative.   Physical Examination: Vitals:   03/22/23 1505  BP: 112/60  Pulse: 89  Resp: 18  Temp: 97.6 F (36.4 C)  SpO2: 97%   Vitals:   03/22/23 1505  Weight: 147 lb 9.6 oz (67 kg)  Height: 5\' 5"  (1.651 m)   Body mass index is 24.56 kg/m. Ideal Body Weight: Weight in (lb) to have BMI = 25: 149.9  GEN: no acute distress.  Looks well, normal weight HEENT: Atraumatic, Normocephalic.  Ears  and Nose: No external deformity. CV: RRR, No M/G/R. No JVD. No thrill. No extra heart sounds. PULM: CTA B, no wheezes, crackles, rhonchi. No retractions. No resp. distress. No accessory muscle use. ABD: S, NT, ND, +BS. No rebound. No HSM. EXTR: No c/c/e PSYCH: Normally interactive. Conversant.    Assessment and Plan: Aspiration pneumonia, unspecified aspiration pneumonia type, unspecified laterality, unspecified part of lung (HCC) - Plan: CBC, Basic metabolic panel, DG Chest 2 View  Following up today from recent hospital admission for heatstroke and aspiration pneumonia.  She continues to improve, feels that she is making good progress.  Will obtain a chest x-ray and basic labs as above-I will be in touch with these results Patient notes that her blood sugars are not quite back to baseline but they continue to improve  She will let me know if any questions or problems Signed Abbe Amsterdam, MD  Addendum 6/28, received labs as below.  Message to patient  Results for orders placed or performed in visit on 03/22/23  CBC  Result Value Ref Range   WBC 6.8 4.0 - 10.5 K/uL   RBC 4.54 3.87 - 5.11 Mil/uL   Platelets 332.0 150.0 - 400.0 K/uL   Hemoglobin 13.9 12.0 - 15.0 g/dL   HCT 16.1 09.6 - 04.5 %   MCV 92.5 78.0 - 100.0 fl   MCHC 33.1 30.0 - 36.0 g/dL   RDW 40.9 81.1 - 91.4 %  Basic metabolic panel  Result Value  Ref Range   Sodium 136 135 - 145 mEq/L   Potassium 4.0 3.5 - 5.1 mEq/L   Chloride 97 96 - 112 mEq/L   CO2 30 19 - 32 mEq/L   Glucose, Bld 137 (H) 70 - 99 mg/dL   BUN 21 6 - 23 mg/dL   Creatinine, Ser 7.82 0.40 - 1.20 mg/dL   GFR 95.62 >13.08 mL/min   Calcium 10.0 8.4 - 10.5 mg/dL

## 2023-03-22 ENCOUNTER — Ambulatory Visit: Payer: 59 | Admitting: Family Medicine

## 2023-03-22 ENCOUNTER — Ambulatory Visit (HOSPITAL_BASED_OUTPATIENT_CLINIC_OR_DEPARTMENT_OTHER)
Admission: RE | Admit: 2023-03-22 | Discharge: 2023-03-22 | Disposition: A | Discharge status: Home / Self Care | Payer: 59 | Source: Ambulatory Visit | Attending: Family Medicine | Admitting: Family Medicine

## 2023-03-22 ENCOUNTER — Encounter: Payer: Self-pay | Admitting: Family Medicine

## 2023-03-22 VITALS — BP 112/60 | HR 89 | Temp 97.6°F | Resp 18 | Ht 65.0 in | Wt 147.6 lb

## 2023-03-22 DIAGNOSIS — J690 Pneumonitis due to inhalation of food and vomit: Secondary | ICD-10-CM | POA: Diagnosis present

## 2023-03-22 NOTE — Patient Instructions (Signed)
Good to see you, I am so glad you are ok!  Please go to lab and then to x-ray on the ground floor, I will be in touch asap

## 2023-03-23 ENCOUNTER — Encounter: Payer: Self-pay | Admitting: Family Medicine

## 2023-03-23 LAB — CBC
HCT: 42 % (ref 36.0–46.0)
Hemoglobin: 13.9 g/dL (ref 12.0–15.0)
MCHC: 33.1 g/dL (ref 30.0–36.0)
MCV: 92.5 fl (ref 78.0–100.0)
Platelets: 332 10*3/uL (ref 150.0–400.0)
RBC: 4.54 Mil/uL (ref 3.87–5.11)
RDW: 14 % (ref 11.5–15.5)
WBC: 6.8 10*3/uL (ref 4.0–10.5)

## 2023-03-23 LAB — BASIC METABOLIC PANEL
BUN: 21 mg/dL (ref 6–23)
CO2: 30 mEq/L (ref 19–32)
Calcium: 10 mg/dL (ref 8.4–10.5)
Chloride: 97 mEq/L (ref 96–112)
Creatinine, Ser: 0.68 mg/dL (ref 0.40–1.20)
GFR: 95.07 mL/min (ref >60.00)
Glucose, Bld: 137 mg/dL — ABNORMAL HIGH (ref 70–99)
Potassium: 4 mEq/L (ref 3.5–5.1)
Sodium: 136 mEq/L (ref 135–145)

## 2023-03-23 NOTE — Telephone Encounter (Signed)
Patient was sent a mychart message per Dr. Patsy Lager at 12:34 pm today and informed that there were no concerns.

## 2023-05-18 ENCOUNTER — Other Ambulatory Visit: Payer: Self-pay

## 2023-05-18 DIAGNOSIS — J45909 Unspecified asthma, uncomplicated: Secondary | ICD-10-CM | POA: Insufficient documentation

## 2023-05-18 NOTE — Progress Notes (Signed)
Cardiology Office Note:  .   Date:  05/21/2023  ID:  Pema Campion, DOB 1960-12-10, MRN [id number] PCP: [name], MD  [facility] Providers Cardiologist:  None    History of Present Illness: .   Fatisha Petitte is a 62 y.o. female with a past medical history of ventricular septal defect, DM 1, asthma, dyslipidemia.  07/01/2021 echo EF 50 to 55%, grade 1 DD, mild MR 07/25/2021 coronary CTA calcium score of 2.29, 70th percentile, small VSD in the muscular portion of the IV septum  Last evaluated by Dr. Bing Matter on 01/25/2022, was doing well from a cardiac perspective and advised to follow-up in 1 year.  She presents today for follow-up.  She has had a relatively tough year, fell and broke her knee, fractured some ribs, subsequently developed pneumonia and had to be hospitalized with sepsis.  She is also has been bothered with fatigue, dizziness for approximately a year.  She was evaluated by her primary care physician in January with a thorough workup for causes of fatigue.  Her blood pressure is marginally low, this could be contributing to her fatigue.  She did have a syncopal event in June that was felt to be secondary to dehydration. No recurrent episodes. She denies chest pain, palpitations, dyspnea, pnd, orthopnea, n, v, syncope, edema, weight gain, or early satiety.   ROS: Review of Systems  Constitutional:  Positive for malaise/fatigue.  HENT: Negative.    Eyes: Negative.   Respiratory: Negative.    Cardiovascular: Negative.   Gastrointestinal: Negative.   Genitourinary: Negative.   Musculoskeletal: Negative.   Skin: Negative.   Neurological:  Positive for dizziness.  Endo/Heme/Allergies: Negative.   Psychiatric/Behavioral: Negative.       Studies Reviewed: .        Cardiac Studies & Procedures       ECHOCARDIOGRAM  ECHOCARDIOGRAM COMPLETE 08/01/2021  Narrative ECHOCARDIOGRAM REPORT    Patient Name:   LAKIMA TKAC Date of Exam:  08/01/2021 Medical Rec #:  [id number]        Height:       65.0 in Accession #:    [id number]       Weight:       179.0 lb Date of Birth:  August 29, 1961         BSA:          1.887 m Patient Age:    62 years         BP:           108/66 mmHg Patient Gender: F                HR:           82 bpm. Exam Location:  [place]  Procedure: 2D Echo, Cardiac Doppler, Color Doppler and Strain Analysis  Indications:    R07.89 Other chest pain  History:        Patient has no prior history of Echocardiogram examinations. Signs/Symptoms:Chest Pain and Shortness of Breath; Risk Factors:Diabetes, Dyslipidemia and Non-Smoker.  Sonographer:    San Jetty RDCS, RVT Referring Phys: [id number] Marveen Reeks KRASOWSKI  IMPRESSIONS   1. Left ventricular ejection fraction, by estimation, is 50 to 55%. The left ventricle has low normal function. The left ventricle has no regional wall motion abnormalities. Left ventricular diastolic parameters are consistent with Grade I diastolic dysfunction (impaired relaxation). 2. Right ventricular systolic function is normal. The right ventricular size is normal. 3. The mitral valve is normal in structure. Mild mitral  valve regurgitation. No evidence of mitral stenosis. 4. The aortic valve is normal in structure. Aortic valve regurgitation is not visualized. No aortic stenosis is present. 5. The inferior vena cava is normal in size with greater than 50% respiratory variability, suggesting right atrial pressure of 3 mmHg.  FINDINGS Left Ventricle: Left ventricular ejection fraction, by estimation, is 50 to 55%. The left ventricle has low normal function. The left ventricle has no regional wall motion abnormalities. The left ventricular internal cavity size was normal in size. There is no left ventricular hypertrophy. Left ventricular diastolic parameters are consistent with Grade I diastolic dysfunction (impaired relaxation).  Right Ventricle: The right ventricular size is normal.  No increase in right ventricular wall thickness. Right ventricular systolic function is normal.  Left Atrium: Left atrial size was normal in size.  Right Atrium: Right atrial size was normal in size.  Pericardium: There is no evidence of pericardial effusion.  Mitral Valve: The mitral valve is normal in structure. Mild mitral valve regurgitation. No evidence of mitral valve stenosis.  Tricuspid Valve: The tricuspid valve is normal in structure. Tricuspid valve regurgitation is trivial. No evidence of tricuspid stenosis.  Aortic Valve: The aortic valve is normal in structure. Aortic valve regurgitation is not visualized. No aortic stenosis is present. Aortic valve mean gradient measures 2.0 mmHg. Aortic valve peak gradient measures 3.9 mmHg. Aortic valve area, by VTI measures 2.87 cm.  Pulmonic Valve: The pulmonic valve was normal in structure. Pulmonic valve regurgitation is not visualized. No evidence of pulmonic stenosis.  Aorta: The aortic root is normal in size and structure.  Venous: The inferior vena cava is normal in size with greater than 50% respiratory variability, suggesting right atrial pressure of 3 mmHg.  IAS/Shunts: No atrial level shunt detected by color flow Doppler.   LEFT VENTRICLE PLAX 2D LVIDd:         4.00 cm     Diastology LVIDs:         2.10 cm     LV e' medial:    6.42 cm/s LV PW:         0.90 cm     LV E/e' medial:  7.6 LV IVS:        0.70 cm     LV e' lateral:   5.87 cm/s LVOT diam:     2.10 cm     LV E/e' lateral: 8.3 LV SV:         56 LV SV Index:   30 LVOT Area:     3.46 cm  LV Volumes (MOD) LV vol d, MOD A2C: 64.7 ml LV vol d, MOD A4C: 73.0 ml LV vol s, MOD A2C: 30.6 ml LV vol s, MOD A4C: 34.2 ml LV SV MOD A2C:     34.1 ml LV SV MOD A4C:     73.0 ml LV SV MOD BP:      35.4 ml  RIGHT VENTRICLE          IVC RV Basal diam:  3.30 cm  IVC diam: 1.60 cm RV Mid diam:    3.00 cm  LEFT ATRIUM           Index       RIGHT ATRIUM            Index LA diam:      2.70 cm 1.43 cm/m  RA Area:     10.50 cm LA Vol (A4C): 15.8 ml 8.37 ml/m  RA Volume:   23.00 ml  12.19 ml/m AORTIC VALVE                    PULMONIC VALVE AV Area (Vmax):    3.01 cm     PV Vmax:       0.73 m/s AV Area (Vmean):   2.96 cm     PV Peak grad:  2.1 mmHg AV Area (VTI):     2.87 cm AV Vmax:           98.30 cm/s AV Vmean:          72.400 cm/s AV VTI:            0.194 m AV Peak Grad:      3.9 mmHg AV Mean Grad:      2.0 mmHg LVOT Vmax:         85.50 cm/s LVOT Vmean:        61.800 cm/s LVOT VTI:          0.161 m LVOT/AV VTI ratio: 0.83  AORTA Ao Root diam: 3.20 cm Ao Asc diam:  2.80 cm  MITRAL VALVE               TRICUSPID VALVE MV Area (PHT): 3.60 cm    TR Peak grad:   12.1 mmHg MV Decel Time: 211 msec    TR Vmax:        174.00 cm/s MV E velocity: 48.80 cm/s MV A velocity: 79.70 cm/s  SHUNTS MV E/A ratio:  0.61        Systemic VTI:  0.16 m Systemic Diam: 2.10 cm  Gypsy Balsam MD Electronically signed by Gypsy Balsam MD Signature Date/Time: 08/01/2021/12:08:51 PM    Final     CT SCANS  CT CORONARY MORPH W/CTA COR W/SCORE 07/25/2021  Addendum 07/27/2021 12:38 PM ADDENDUM REPORT: 07/27/2021 12:36  CLINICAL DATA:  Atypical CP  EXAM: Cardiac/Coronary  CTA  TECHNIQUE: The patient was scanned on a [employer].  FINDINGS: A 120 kV prospective scan was triggered in the descending thoracic aorta at 111 HU's. Axial non-contrast 3 mm slices were carried out through the heart. The data set was analyzed on a dedicated work station and scored using the Agatson method. Gantry rotation speed was 250 msecs and collimation was .6 mm. No beta blockade and 0.8 mg of sl NTG was given. The 3D data set was reconstructed in 5% intervals of the 67-82 % of the R-R cycle. Diastolic phases were analyzed on a dedicated work station using MPR, MIP and VRT modes. The patient received 80 cc of contrast.  Aorta:  Normal size.  No  calcifications.  No dissection.  Aortic Valve:  Trileaflet.  No calcifications.  Coronary Arteries:  Normal coronary origin.  Right dominance.  RCA is a large dominant artery that gives rise to PDA and PLA. There is no plaque.  Left main is a large artery that gives rise to LAD and LCX arteries.  LAD is a large vessel that has no plaque. Significant misregistration artefacts noted.  LCX is a non-dominant artery that gives rise to one large OM1 branch. There is no plaque.  Other findings:  Normal pulmonary vein drainage into the left atrium.  Normal left atrial appendage without a thrombus.  Normal size of the pulmonary artery.  Small VSD noted in mid/distal muscular portion of the intraventricular septum in its posterior region. Max diameter 2.8 mm.  IMPRESSION: 1. Coronary calcium score of 2.29. This was 85 percentile for age and sex  matched control.  2. Normal coronary origin with right dominance.  3. No evidence of CAD.  4. Small VSD in the muscular portion of the intraventricular septum.  Georgeanna Lea, MD   Electronically Signed By: Gypsy Balsam M.D. On: 07/27/2021 12:36  Narrative EXAM: OVER-READ INTERPRETATION  CT CHEST  The following report is an over-read performed by radiologist Dr. Trudie Reed of [facility] on 07/25/2021. This over-read does not include interpretation of cardiac or coronary anatomy or pathology. The coronary calcium score/coronary CTA interpretation by the cardiologist is attached.  COMPARISON:  None.  FINDINGS: Within the visualized portions of the thorax there are no suspicious appearing pulmonary nodules or masses, there is no acute consolidative airspace disease, no pleural effusions, no pneumothorax and no lymphadenopathy. Visualized portions of the upper abdomen are unremarkable. There are no aggressive appearing lytic or blastic lesions noted in the visualized portions of  the skeleton.  IMPRESSION: 1. No significant incidental noncardiac findings are noted.  Electronically Signed: By: Trudie Reed M.D. On: 07/25/2021 09:20          Risk Assessment/Calculations:             Physical Exam:   VS:  BP 90/60   Pulse 76   Ht 5\' 5"  (1.651 m)   Wt 146 lb 9.6 oz (66.5 kg)   SpO2 95%   BMI 24.40 kg/m    Wt Readings from Last 3 Encounters:  05/21/23 146 lb 9.6 oz (66.5 kg)  03/22/23 147 lb 9.6 oz (67 kg)  03/10/23 155 lb (70.3 kg)    GEN: Well nourished, well developed in no acute distress NECK: No JVD; No carotid bruits CARDIAC: RRR, no murmurs, rubs, gallops RESPIRATORY:  Clear to auscultation without rales, wheezing or rhonchi  ABDOMEN: Soft, non-tender, non-distended EXTREMITIES:  No edema; No deformity   ASSESSMENT AND PLAN: .   Coronary artery disease-mild nonobstructive per coronary CTA 2023, Stable with no anginal symptoms. No indication for ischemic evaluation.  Heart healthy diet and regular cardiovascular exercise encouraged.  Continue Lipitor 20 mg daily. Dizziness-likely secondary to orthostasis, blood pressure is marginally low, she has been intentionally losing weight over the last year.  She is not currently on any antihypertensive agents.  Encouraged her to increase her p.o. intake, liberalize her salt, encouraged her to start walking. Fatigue-has been persistent and ongoing for some time, evaluated by her PCP for this, extensive lab work reviewed, her vitamin D is low, suggest she start vitamin D supplementation.       Dispo: Start Vitamin D, increase water intake, liberalize salt, increase physical activity. Follow up in 1 year, but sooner if symptoms persist.   Signed, Flossie Dibble, NP

## 2023-05-21 ENCOUNTER — Ambulatory Visit: Payer: 59 | Attending: Cardiology | Admitting: Cardiology

## 2023-05-21 ENCOUNTER — Encounter: Payer: Self-pay | Admitting: Cardiology

## 2023-05-21 VITALS — BP 90/60 | HR 76 | Ht 65.0 in | Wt 146.6 lb

## 2023-05-21 DIAGNOSIS — R42 Dizziness and giddiness: Secondary | ICD-10-CM | POA: Diagnosis not present

## 2023-05-21 DIAGNOSIS — E785 Hyperlipidemia, unspecified: Secondary | ICD-10-CM | POA: Diagnosis not present

## 2023-05-21 DIAGNOSIS — I2510 Atherosclerotic heart disease of native coronary artery without angina pectoris: Secondary | ICD-10-CM | POA: Diagnosis not present

## 2023-05-21 NOTE — Patient Instructions (Signed)
Medication Instructions:  Your physician has recommended you make the following change in your medication:  Start Over the Counter D3 and take per recommendations on bottle Increase Fluid (water) to 64 Oz per day Increase salt intake, drink Gatorade Walk 30 minutes per day at least 4 to 5 days a week  *If you need a refill on your cardiac medications before your next appointment, please call your pharmacy*   Lab Work: NONE If you have labs (blood work) drawn today and your tests are completely normal, you will receive your results only by: MyChart Message (if you have MyChart) OR A paper copy in the mail If you have any lab test that is abnormal or we need to change your treatment, we will call you to review the results.   Testing/Procedures: NONE   Follow-Up: At [facility], you and your health needs are our priority.  As part of our continuing mission to provide you with exceptional heart care, we have created designated Provider Care Teams.  These Care Teams include your primary Cardiologist (physician) and Advanced Practice Providers (APPs -  Physician Assistants and Nurse Practitioners) who all work together to provide you with the care you need, when you need it.  We recommend signing up for the patient portal called "MyChart".  Sign up information is provided on this After Visit Summary.  MyChart is used to connect with patients for Virtual Visits (Telemedicine).  Patients are able to view lab/test results, encounter notes, upcoming appointments, etc.  Non-urgent messages can be sent to your provider as well.   To learn more about what you can do with MyChart, go to [email or web address].    Your next appointment:   1 year(s)  Provider:   Gypsy Balsam, MD    Other Instructions

## 2023-06-12 ENCOUNTER — Other Ambulatory Visit: Payer: Self-pay | Admitting: Family Medicine

## 2023-07-03 ENCOUNTER — Encounter: Payer: Self-pay | Admitting: Physician Assistant

## 2023-07-03 ENCOUNTER — Ambulatory Visit: Payer: 59 | Admitting: Physician Assistant

## 2023-07-03 VITALS — BP 117/76 | HR 76 | Ht 65.0 in | Wt 142.8 lb

## 2023-07-03 DIAGNOSIS — Z7985 Long-term (current) use of injectable non-insulin antidiabetic drugs: Secondary | ICD-10-CM | POA: Diagnosis not present

## 2023-07-03 DIAGNOSIS — E119 Type 2 diabetes mellitus without complications: Secondary | ICD-10-CM | POA: Diagnosis not present

## 2023-07-03 DIAGNOSIS — Z0000 Encounter for general adult medical examination without abnormal findings: Secondary | ICD-10-CM

## 2023-07-03 DIAGNOSIS — Z794 Long term (current) use of insulin: Secondary | ICD-10-CM | POA: Diagnosis not present

## 2023-07-03 DIAGNOSIS — G4709 Other insomnia: Secondary | ICD-10-CM

## 2023-07-03 LAB — POCT GLYCOSYLATED HEMOGLOBIN (HGB A1C): Hemoglobin A1C: 6.6 % — AB (ref 4.0–5.6)

## 2023-07-03 NOTE — Patient Instructions (Signed)
I do encourage you to work on improving your sleep, you want to aim for 7 to 8 hours of sleep.  I encourage you to use melatonin over-the-counter.  And consider starting your day with a morning walk in the natural sunlight.  Please let us know if there is anything else we can do for you  Roney Jaffe, PA-C Physician Assistant [facility] [email or web address]   Insomnia Insomnia is a sleep disorder that makes it difficult to fall asleep or stay asleep. Insomnia can cause fatigue, low energy, difficulty concentrating, mood swings, and poor performance at work or school. There are three different ways to classify insomnia: Difficulty falling asleep. Difficulty staying asleep. Waking up too early in the morning. Any type of insomnia can be long-term (chronic) or short-term (acute). Both are common. Short-term insomnia usually lasts for 3 months or less. Chronic insomnia occurs at least three times a week for longer than 3 months. What are the causes? Insomnia may be caused by another condition, situation, or substance, such as: Having certain mental health conditions, such as anxiety and depression. Using caffeine, alcohol, tobacco, or drugs. Having gastrointestinal conditions, such as gastroesophageal reflux disease (GERD). Having certain medical conditions. These include: Asthma. Alzheimer's disease. Stroke. Chronic pain. An overactive thyroid gland (hyperthyroidism). Other sleep disorders, such as restless legs syndrome and sleep apnea. Menopause. Sometimes, the cause of insomnia may not be known. What increases the risk? Risk factors for insomnia include: Gender. Females are affected more often than males. Age. Insomnia is more common as people get older. Stress and certain medical and mental health conditions. Lack of exercise. Having an irregular work schedule. This may include working night shifts and traveling between  different time zones. What are the signs or symptoms? If you have insomnia, the main symptom is having trouble falling asleep or having trouble staying asleep. This may lead to other symptoms, such as: Feeling tired or having low energy. Feeling nervous about going to sleep. Not feeling rested in the morning. Having trouble concentrating. Feeling irritable, anxious, or depressed. How is this diagnosed? This condition may be diagnosed based on: Your symptoms and medical history. Your health care provider may ask about: Your sleep habits. Any medical conditions you have. Your mental health. A physical exam. How is this treated? Treatment for insomnia depends on the cause. Treatment may focus on treating an underlying condition that is causing the insomnia. Treatment may also include: Medicines to help you sleep. Counseling or therapy. Lifestyle adjustments to help you sleep better. Follow these instructions at home: Eating and drinking  Limit or avoid alcohol, caffeinated beverages, and products that contain nicotine and tobacco, especially close to bedtime. These can disrupt your sleep. Do not eat a large meal or eat spicy foods right before bedtime. This can lead to digestive discomfort that can make it hard for you to sleep. Sleep habits  Keep a sleep diary to help you and your health care provider figure out what could be causing your insomnia. Write down: When you sleep. When you wake up during the night. How well you sleep and how rested you feel the next day. Any side effects of medicines you are taking. What you eat and drink. Make your bedroom a dark, comfortable place where it is easy to fall asleep. Put up shades or blackout curtains to block light from outside. Use a white noise machine to block noise. Keep the temperature cool. Limit screen use before bedtime. This includes: Not watching TV.  Not using your smartphone, tablet, or computer. Stick to a routine that  includes going to bed and waking up at the same times every day and night. This can help you fall asleep faster. Consider making a quiet activity, such as reading, part of your nighttime routine. Try to avoid taking naps during the day so that you sleep better at night. Get out of bed if you are still awake after 15 minutes of trying to sleep. Keep the lights down, but try reading or doing a quiet activity. When you feel sleepy, go back to bed. General instructions Take over-the-counter and prescription medicines only as told by your health care provider. Exercise regularly as told by your health care provider. However, avoid exercising in the hours right before bedtime. Use relaxation techniques to manage stress. Ask your health care provider to suggest some techniques that may work well for you. These may include: Breathing exercises. Routines to release muscle tension. Visualizing peaceful scenes. Make sure that you drive carefully. Do not drive if you feel very sleepy. Keep all follow-up visits. This is important. Contact a health care provider if: You are tired throughout the day. You have trouble in your daily routine due to sleepiness. You continue to have sleep problems, or your sleep problems get worse. Get help right away if: You have thoughts about hurting yourself or someone else. Get help right away if you feel like you may hurt yourself or others, or have thoughts about taking your own life. Go to your nearest emergency room or: Call 911. Call the National Suicide Prevention Lifeline at (519)403-7336 or 988. This is open 24 hours a day. Text the Crisis Text Line at 6144686693. Summary Insomnia is a sleep disorder that makes it difficult to fall asleep or stay asleep. Insomnia can be long-term (chronic) or short-term (acute). Treatment for insomnia depends on the cause. Treatment may focus on treating an underlying condition that is causing the insomnia. Keep a sleep diary to help  you and your health care provider figure out what could be causing your insomnia. This information is not intended to replace advice given to you by your health care provider. Make sure you discuss any questions you have with your health care provider. Document Revised: 08/22/2021 Document Reviewed: 08/22/2021 Elsevier Patient Education  2024 [employer].

## 2023-07-03 NOTE — Progress Notes (Signed)
New Patient Office Visit  Subjective    Patient ID: Sierra Lyons, female    DOB: December 22, 1960  Age: 62 y.o. MRN: [id number]  CC:  Chief Complaint  Patient presents with   Insomnia   Fatigue    HPI Sierra Lyons states that she has been experiencing low energy levels over the past year.  States that she has always in the past and been very active and sometimes feels that her desire to do things has diminished.  States that she does have difficulty sleeping, mostly staying asleep.  States that she is sleeping approximately 6 hours a night.  States that she has tried over-the-counter sleep aids but states they made her feel groggy the next day.  Endorses good sleep hygiene.      07/03/2023   10:19 AM 11/04/2020   11:03 AM 03/08/2016    9:07 AM  Depression screen PHQ 2/9  Decreased Interest 0 3 0  Down, Depressed, Hopeless 1 2 0  PHQ - 2 Score 1 5 0  Altered sleeping 3 2   Tired, decreased energy 3 3   Change in appetite 2 3   Feeling bad or failure about yourself  0 1   Trouble concentrating 2 1   Moving slowly or fidgety/restless 3 1   Suicidal thoughts 0 0   PHQ-9 Score 14 16   Difficult doing work/chores Somewhat difficult Somewhat difficult       07/03/2023   10:19 AM  GAD 7 : Generalized Anxiety Score  Nervous, Anxious, on Edge 0  Control/stop worrying 2  Worry too much - different things 3  Trouble relaxing 3  Restless 3  Easily annoyed or irritable 2  Afraid - awful might happen 1  Total GAD 7 Score 14  Anxiety Difficulty Somewhat difficult       Outpatient Encounter Medications as of 07/03/2023  Medication Sig   Albuterol-Budesonide (AIRSUPRA) 90-80 MCG/ACT AERO Inhale 2 Inhalations into the lungs as needed. Maximum 12 puffs per day.   atorvastatin (LIPITOR) 20 MG tablet Take 1 tablet (20 mg total) by mouth daily.   Budeson-Glycopyrrol-Formoterol (BREZTRI AEROSPHERE) 160-9-4.8 MCG/ACT AERO 2 puffs twice daily with spacer to prevent coughing or  wheezing. Rinse, gargle, and spit after use.   FLOVENT HFA 110 MCG/ACT inhaler Inhale 2 puffs into the lungs 2 (two) times daily.   fluticasone (FLONASE) 50 MCG/ACT nasal spray SHAKE LIQUID AND USE 1 SPRAY IN EACH NOSTRIL DAILY AS NEEDED FOR ALLERGIES   insulin degludec (TRESIBA FLEXTOUCH) 100 UNIT/ML FlexTouch Pen Inject 6 Units into the skin daily.   Insulin Pen Needle (BD PEN NEEDLE NANO 2ND GEN) 32G X 4 MM MISC 2 times daily   montelukast (SINGULAIR) 10 MG tablet Take 1 tablet (10 mg total) by mouth at bedtime.   MOUNJARO 10 MG/0.5ML Pen Inject 10 mg into the skin once a week.   venlafaxine XR (EFFEXOR-XR) 150 MG 24 hr capsule TAKE 1 CAPSULE BY MOUTH DAILY  WITH BREAKFAST   Facility-Administered Encounter Medications as of 07/03/2023  Medication   mepolizumab (NUCALA) injection 100 mg    Past Medical History:  Diagnosis Date   Allergic rhinitis 08/10/2015   Asthma    Atypical chest pain 07/15/2021   Chronic rhinitis around 1986   Diabetes mellitus without complication (HCC)    Dyslipidemia 02/06/2020   Dyspnea on exertion 07/15/2021   Hyperlipidemia 08/31/2021   Latent autoimmune diabetes in adults (LADA), managed as type 1 (HCC) 02/06/2020   Moderate persistent asthma  09/07/2015   Osteopenia 08/01/2021   Overweight 01/05/2016   Sepsis (HCC) 03/10/2023   Type 1 diabetes mellitus with hyperglycemia (HCC) 07/20/2020   Vitamin D deficiency 08/31/2021   VSD (ventricular septal defect), muscular very small pick up on coronary CT angio 09/14/2021    Past Surgical History:  Procedure Laterality Date   CESAREAN SECTION  1993, 1997, 1999   VEIN SURGERY  2012    Family History  Problem Relation Age of Onset   Diabetes Mother    Hypertension Mother    Allergic rhinitis Father    Allergic rhinitis Sister    Asthma Sister    Urticaria Sister    Diabetes Maternal Grandmother    Hypertension Maternal Grandmother     Social History   Socioeconomic History   Marital status:  Married    Spouse name: Not on file   Number of children: Not on file   Years of education: Not on file   Highest education level: Not on file  Occupational History   Not on file  Tobacco Use   Smoking status: Never   Smokeless tobacco: Never  Vaping Use   Vaping status: Never Used  Substance and Sexual Activity   Alcohol use: No   Drug use: No   Sexual activity: Yes    Birth control/protection: None  Other Topics Concern   Not on file  Social History Narrative   Not on file   Social Determinants of Health   Financial Resource Strain: Not on file  Food Insecurity: No Food Insecurity (03/10/2023)   Hunger Vital Sign    Worried About Running Out of Food in the Last Year: Never true    Ran Out of Food in the Last Year: Never true  Transportation Needs: No Transportation Needs (03/10/2023)   PRAPARE - Administrator, Civil Service (Medical): No    Lack of Transportation (Non-Medical): No  Physical Activity: Not on file  Stress: Not on file  Social Connections: Unknown (02/06/2022)   Received from [facility]   Social Network    Social Network: Not on file  Intimate Partner Violence: Not At Risk (03/10/2023)   Humiliation, Afraid, Rape, and Kick questionnaire    Fear of Current or Ex-Partner: No    Emotionally Abused: No    Physically Abused: No    Sexually Abused: No    Review of Systems  Constitutional: Negative.   HENT: Negative.    Eyes: Negative.   Respiratory:  Negative for shortness of breath.   Cardiovascular:  Negative for chest pain.  Gastrointestinal: Negative.   Genitourinary: Negative.   Musculoskeletal: Negative.   Skin: Negative.   Neurological: Negative.   Endo/Heme/Allergies: Negative.   Psychiatric/Behavioral:  Negative for depression and suicidal ideas. The patient has insomnia. The patient is not nervous/anxious.         Objective    BP 117/76   Pulse 76   Ht 5\' 5"  (1.651 m)   Wt 142 lb 12.8 oz (64.8 kg)    BMI 23.76 kg/m   Physical Exam Vitals and nursing note reviewed.  Constitutional:      Appearance: Normal appearance.  HENT:     Head: Normocephalic and atraumatic.     Right Ear: External ear normal.     Left Ear: External ear normal.     Nose: Nose normal.     Mouth/Throat:     Mouth: Mucous membranes are moist.     Pharynx: Oropharynx is clear.  Eyes:     Extraocular Movements: Extraocular movements intact.     Conjunctiva/sclera: Conjunctivae normal.     Pupils: Pupils are equal, round, and reactive to light.  Cardiovascular:     Rate and Rhythm: Normal rate and regular rhythm.     Pulses: Normal pulses.     Heart sounds: Normal heart sounds.  Pulmonary:     Effort: Pulmonary effort is normal.     Breath sounds: Normal breath sounds.  Musculoskeletal:        General: Normal range of motion.     Cervical back: Normal range of motion and neck supple.  Skin:    General: Skin is warm and dry.  Neurological:     General: No focal deficit present.     Mental Status: She is alert and oriented to person, place, and time.  Psychiatric:        Mood and Affect: Mood normal.        Behavior: Behavior normal.        Thought Content: Thought content normal.        Judgment: Judgment normal.         Assessment & Plan:   Problem List Items Addressed This Visit       Endocrine   Diabetes mellitus without complication (HCC)   Other Visit Diagnoses     Wellness examination    -  Primary   Other insomnia          1. Wellness examination Patient education given on general health maintenance  2. Other insomnia Patient education given on supportive care, lifestyle modifications, trial melatonin over-the-counter.  Patient would like to try these lifestyle modifications prior to any further interventions.  Patient encouraged to follow-up with mobile unit as needed  3. Type 2 diabetes mellitus without complication, without long-term current use of insulin (HCC) A1c  6.6. - POCT glycosylated hemoglobin (Hb A1C)   I have reviewed the patient's medical history (PMH, PSH, Social History, Family History, Medications, and allergies) , and have been updated if relevant. I spent 30 minutes reviewing chart and  face to face time with patient.    Return if symptoms worsen or fail to improve.   Kasandra Knudsen Mayers, PA-C

## 2023-07-16 ENCOUNTER — Ambulatory Visit: Payer: 59 | Admitting: Family Medicine

## 2023-07-16 NOTE — Progress Notes (Deleted)
[address] [place] [address] Dept: [phone]  FOLLOW UP NOTE  Patient ID: Sierra Lyons, female    DOB: [birth year]  Age: 62 y.o. MRN: [id number] Date of Office Visit: 07/16/2023  Assessment  Chief Complaint: No chief complaint on file.  HPI Sierra Lyons is a 62 year old female who presents to the clinic for follow-up visit.  She was last seen in this clinic on 07/26/2022 by Dr. Maurine Minister for evaluation of asthma and allergic rhinitis.  Her last environmental allergy skin testing was on 08/10/2015 and was positive to grass pollen, weed pollen, ragweed pollen, tree pollen, mold, dust mite, cat, feather, and cockroach.  Discussed the use of AI scribe software for clinical note transcription with the patient, who gave verbal consent to proceed.  History of Present Illness             Drug Allergies:  Allergies  Allergen Reactions   Aspirin Swelling    Swelling of the face     Physical Exam: There were no vitals taken for this visit.   Physical Exam  Diagnostics:    Assessment and Plan: No diagnosis found.  No orders of the defined types were placed in this encounter.   There are no Patient Instructions on file for this visit.  No follow-ups on file.    Thank you for the opportunity to care for this patient.  Please do not hesitate to contact me with questions.  Thermon Leyland, FNP [facility]

## 2023-08-31 ENCOUNTER — Other Ambulatory Visit: Payer: Self-pay | Admitting: Family Medicine

## 2023-08-31 DIAGNOSIS — E785 Hyperlipidemia, unspecified: Secondary | ICD-10-CM

## 2023-11-17 NOTE — Progress Notes (Unsigned)
 [facility] [address], [address] [place], [place] [address] [phone]  Date:  11/21/2023   Name:  Sierra Lyons   DOB:  12-07-1960   MRN:  [id number]  PCP:  [name], MD    Chief Complaint: Pre-op Exam (Letter for clearance, cmp, cbc, EKG, ov notes)   History of Present Illness:  Sierra Lyons is a 63 y.o. very pleasant female patient who presents with the following:  Patient seen today for preoperative visit- she is going to do a tummy tuck at [facility]. Plan for 4/8- outpt surgery per Dr Helene Shoe, Ashit, MD (he)  [address]  [place], [place] [address]  [phone] (Work)  [phone] (Fax)   They request a recent CPE, labs, EKG, CBC, chem Most recently seen by myself was in June History of hyperlipidemia, asthma and allergies, LADA type diabetes She does have endocrinology care - Dr Rolly Salter in [place] No recent operations She does have asthma- she is using Bretzri daily and notes her symptoms are well-controlled She may use her albuterol twice a month or so   Flu vaccine- done  Eye exam Can update foot exam Pap 2021, can update- pt notes this was done last year per her GYN   She is able to do 4 mets of activity such as climbing 2 flights of stairs and cleaning her home with no CP or SOB   Lab Results  Component Value Date   HGBA1C 6.6 (A) 07/03/2023     Patient Active Problem List   Diagnosis Date Noted   Asthma    Sepsis (HCC) 03/10/2023   VSD (ventricular septal defect), muscular very small pick up on coronary CT angio 09/14/2021   Hyperlipidemia 08/31/2021   Vitamin D deficiency 08/31/2021   Osteopenia 08/01/2021   Atypical chest pain 07/15/2021   Dyspnea on exertion 07/15/2021   Diabetes mellitus without complication (HCC) 07/11/2021   Chronic rhinitis 07/11/2021   Type 1 diabetes mellitus with hyperglycemia (HCC) 07/20/2020   Latent autoimmune diabetes in adults (LADA),  managed as type 1 (HCC) 02/06/2020   Dyslipidemia 02/06/2020   Overweight 01/05/2016   Moderate persistent asthma 09/07/2015   Allergic rhinitis 08/10/2015    Past Medical History:  Diagnosis Date   Allergic rhinitis 08/10/2015   Asthma    Atypical chest pain 07/15/2021   Chronic rhinitis around 1986   Diabetes mellitus without complication (HCC)    Dyslipidemia 02/06/2020   Dyspnea on exertion 07/15/2021   Hyperlipidemia 08/31/2021   Latent autoimmune diabetes in adults (LADA), managed as type 1 (HCC) 02/06/2020   Moderate persistent asthma 09/07/2015   Osteopenia 08/01/2021   Overweight 01/05/2016   Sepsis (HCC) 03/10/2023   Type 1 diabetes mellitus with hyperglycemia (HCC) 07/20/2020   Vitamin D deficiency 08/31/2021   VSD (ventricular septal defect), muscular very small pick up on coronary CT angio 09/14/2021    Past Surgical History:  Procedure Laterality Date   CESAREAN SECTION  1993, 1997, 1999   VEIN SURGERY  2012    Social History   Tobacco Use   Smoking status: Never   Smokeless tobacco: Never  Vaping Use   Vaping status: Never Used  Substance Use Topics   Alcohol use: No   Drug use: No    Family History  Problem Relation Age of Onset   Diabetes Mother    Hypertension Mother    Allergic rhinitis Father    Allergic rhinitis Sister  Asthma Sister    Urticaria Sister    Diabetes Maternal Grandmother    Hypertension Maternal Grandmother     Allergies  Allergen Reactions   Aspirin Swelling    Swelling of the face     Medication list has been reviewed and updated.  Current Outpatient Medications on File Prior to Visit  Medication Sig Dispense Refill   Albuterol-Budesonide (AIRSUPRA) 90-80 MCG/ACT AERO Inhale 2 Inhalations into the lungs as needed. Maximum 12 puffs per day. 10.7 g 3   atorvastatin (LIPITOR) 20 MG tablet TAKE 1 TABLET BY MOUTH DAILY 90 tablet 3   Budeson-Glycopyrrol-Formoterol (BREZTRI AEROSPHERE) 160-9-4.8 MCG/ACT AERO 2 puffs  twice daily with spacer to prevent coughing or wheezing. Rinse, gargle, and spit after use. 3 each 1   FLOVENT HFA 110 MCG/ACT inhaler Inhale 2 puffs into the lungs 2 (two) times daily. 1 each 5   fluticasone (FLONASE) 50 MCG/ACT nasal spray SHAKE LIQUID AND USE 1 SPRAY IN EACH NOSTRIL DAILY AS NEEDED FOR ALLERGIES 48 g 0   insulin degludec (TRESIBA FLEXTOUCH) 100 UNIT/ML FlexTouch Pen Inject 6 Units into the skin daily.     Insulin Pen Needle (BD PEN NEEDLE NANO 2ND GEN) 32G X 4 MM MISC 2 times daily 100 each 0   montelukast (SINGULAIR) 10 MG tablet Take 1 tablet (10 mg total) by mouth at bedtime. 90 tablet 1   MOUNJARO 10 MG/0.5ML Pen Inject 10 mg into the skin once a week.     venlafaxine XR (EFFEXOR-XR) 150 MG 24 hr capsule TAKE 1 CAPSULE BY MOUTH DAILY  WITH BREAKFAST 90 capsule 3   Current Facility-Administered Medications on File Prior to Visit  Medication Dose Route Frequency Provider Last Rate Last Admin   mepolizumab (NUCALA) injection 100 mg  100 mg Subcutaneous Q28 days Verlee Monte, MD   100 mg at 07/26/22 4782    Review of Systems:  As per HPI- otherwise negative.   Physical Examination: Vitals:   11/21/23 1422  BP: 118/78  Pulse: 78  Resp: 18  Temp: 98.3 F (36.8 C)  SpO2: 98%   Vitals:   11/21/23 1422  Weight: 143 lb 9.6 oz (65.1 kg)  Height: 5\' 5"  (1.651 m)   Body mass index is 23.9 kg/m. Ideal Body Weight: Weight in (lb) to have BMI = 25: 149.9  GEN: no acute distress. Normal weight, looks well  HEENT: Atraumatic, Normocephalic.  Ears and Nose: No external deformity. CV: RRR, No M/G/R. No JVD. No thrill. No extra heart sounds. PULM: CTA B, no wheezes, crackles, rhonchi. No retractions. No resp. distress. No accessory muscle use. ABD: S, NT, ND, +BS. No rebound. No HSM. EXTR: No c/c/e PSYCH: Normally interactive. Conversant.   EKG: NSR, rate 93 Assessment and Plan: Dyslipidemia - Plan: Lipid panel  Pre-operative cardiovascular examination - Plan:  EKG 12-Lead  Latent autoimmune diabetes in adults (LADA), managed as type 1 (HCC) - Plan: Comprehensive metabolic panel, Hemoglobin A1c  Screening for deficiency anemia - Plan: CBC  Thyroid disorder screening - Plan: TSH  Moderate persistent asthma without complication  Pt seen today for a pre-operative visit and labs  EKG reassuring, she is able to perform 4 METS of activity.  Await lab work as above and plan to clear her for surgery.  Will also let her surgeon know about her asthma Signed Abbe Amsterdam, MD  Addendum 2/27, received labs as below.  Message to patient Results for orders placed or performed in visit on 11/21/23  CBC  Collection Time: 11/21/23  2:48 PM  Result Value Ref Range   WBC 6.4 4.0 - 10.5 K/uL   RBC 4.35 3.87 - 5.11 Mil/uL   Platelets 256.0 150.0 - 400.0 K/uL   Hemoglobin 13.7 12.0 - 15.0 g/dL   HCT 16.1 09.6 - 04.5 %   MCV 93.9 78.0 - 100.0 fl   MCHC 33.4 30.0 - 36.0 g/dL   RDW 40.9 81.1 - 91.4 %  Comprehensive metabolic panel   Collection Time: 11/21/23  2:48 PM  Result Value Ref Range   Sodium 136 135 - 145 mEq/L   Potassium 4.3 3.5 - 5.1 mEq/L   Chloride 100 96 - 112 mEq/L   CO2 28 19 - 32 mEq/L   Glucose, Bld 84 70 - 99 mg/dL   BUN 26 (H) 6 - 23 mg/dL   Creatinine, Ser 7.82 0.40 - 1.20 mg/dL   Total Bilirubin 0.3 0.2 - 1.2 mg/dL   Alkaline Phosphatase 44 39 - 117 U/L   AST 20 0 - 37 U/L   ALT 18 0 - 35 U/L   Total Protein 6.7 6.0 - 8.3 g/dL   Albumin 4.1 3.5 - 5.2 g/dL   GFR 95.62 >13.08 mL/min   Calcium 9.4 8.4 - 10.5 mg/dL  Hemoglobin M5H   Collection Time: 11/21/23  2:48 PM  Result Value Ref Range   Hgb A1c MFr Bld 6.9 (H) 4.6 - 6.5 %  Lipid panel   Collection Time: 11/21/23  2:48 PM  Result Value Ref Range   Cholesterol 165 0 - 200 mg/dL   Triglycerides 846.9 (H) 0.0 - 149.0 mg/dL   HDL 62.95 >28.41 mg/dL   VLDL 32.4 0.0 - 40.1 mg/dL   LDL Cholesterol 67 0 - 99 mg/dL   Total CHOL/HDL Ratio 3    NonHDL 100.09   TSH    Collection Time: 11/21/23  2:48 PM  Result Value Ref Range   TSH 0.69 0.35 - 5.50 uIU/mL

## 2023-11-21 ENCOUNTER — Ambulatory Visit: Payer: 59 | Admitting: Family Medicine

## 2023-11-21 VITALS — BP 118/78 | HR 78 | Temp 98.3°F | Resp 18 | Ht 65.0 in | Wt 143.6 lb

## 2023-11-21 DIAGNOSIS — Z01810 Encounter for preprocedural cardiovascular examination: Secondary | ICD-10-CM | POA: Diagnosis not present

## 2023-11-21 DIAGNOSIS — Z1329 Encounter for screening for other suspected endocrine disorder: Secondary | ICD-10-CM

## 2023-11-21 DIAGNOSIS — E139 Other specified diabetes mellitus without complications: Secondary | ICD-10-CM

## 2023-11-21 DIAGNOSIS — E785 Hyperlipidemia, unspecified: Secondary | ICD-10-CM

## 2023-11-21 DIAGNOSIS — J4540 Moderate persistent asthma, uncomplicated: Secondary | ICD-10-CM

## 2023-11-21 DIAGNOSIS — Z130 Encounter for screening for diseases of the blood and blood-forming organs and certain disorders involving the immune mechanism: Secondary | ICD-10-CM | POA: Diagnosis not present

## 2023-11-21 NOTE — Patient Instructions (Addendum)
 It was good to see you today- I will be in touch with your labs and get you cleared for surgery assuming all looks ok!

## 2023-11-22 ENCOUNTER — Encounter: Payer: Self-pay | Admitting: Family Medicine

## 2023-11-22 LAB — COMPREHENSIVE METABOLIC PANEL
ALT: 18 U/L (ref 0–35)
AST: 20 U/L (ref 0–37)
Albumin: 4.1 g/dL (ref 3.5–5.2)
Alkaline Phosphatase: 44 U/L (ref 39–117)
BUN: 26 mg/dL — ABNORMAL HIGH (ref 6–23)
CO2: 28 meq/L (ref 19–32)
Calcium: 9.4 mg/dL (ref 8.4–10.5)
Chloride: 100 meq/L (ref 96–112)
Creatinine, Ser: 0.79 mg/dL (ref 0.40–1.20)
GFR: 81.27 mL/min (ref >60.00)
Glucose, Bld: 84 mg/dL (ref 70–99)
Potassium: 4.3 meq/L (ref 3.5–5.1)
Sodium: 136 meq/L (ref 135–145)
Total Bilirubin: 0.3 mg/dL (ref 0.2–1.2)
Total Protein: 6.7 g/dL (ref 6.0–8.3)

## 2023-11-22 LAB — LIPID PANEL
Cholesterol: 165 mg/dL (ref 0–200)
HDL: 65.3 mg/dL (ref >39.00)
LDL Cholesterol: 67 mg/dL (ref 0–99)
NonHDL: 100.09
Total CHOL/HDL Ratio: 3
Triglycerides: 163 mg/dL — ABNORMAL HIGH (ref 0.0–149.0)
VLDL: 32.6 mg/dL (ref 0.0–40.0)

## 2023-11-22 LAB — HEMOGLOBIN A1C: Hgb A1c MFr Bld: 6.9 % — ABNORMAL HIGH (ref 4.6–6.5)

## 2023-11-22 LAB — CBC
HCT: 40.9 % (ref 36.0–46.0)
Hemoglobin: 13.7 g/dL (ref 12.0–15.0)
MCHC: 33.4 g/dL (ref 30.0–36.0)
MCV: 93.9 fL (ref 78.0–100.0)
Platelets: 256 10*3/uL (ref 150.0–400.0)
RBC: 4.35 Mil/uL (ref 3.87–5.11)
RDW: 14.1 % (ref 11.5–15.5)
WBC: 6.4 10*3/uL (ref 4.0–10.5)

## 2023-11-22 LAB — TSH: TSH: 0.69 u[IU]/mL (ref 0.35–5.50)

## 2023-12-25 ENCOUNTER — Other Ambulatory Visit: Payer: Self-pay | Admitting: Internal Medicine

## 2023-12-25 LAB — HM DIABETES EYE EXAM

## 2024-04-28 ENCOUNTER — Telehealth: Payer: Self-pay | Admitting: Family Medicine

## 2024-04-28 NOTE — Telephone Encounter (Signed)
 Copied from CRM [id number]. Topic: Referral - Request for Referral >> Apr 28, 2024  4:44 PM Jasmin G wrote: Did the patient discuss referral with their provider in the last year? Yes (If No - schedule appointment) (If Yes - send message)  Appointment offered? No  Type of order/referral and detailed reason for visit: Referral for an otorhinolaryngologist  Preference of office, provider, location: None given  If referral order, have you been seen by this specialty before? No (If Yes, this issue or another issue? When? Where?  Can we respond through MyChart? No, pt prefers a phone call at [phone]

## 2024-06-17 ENCOUNTER — Other Ambulatory Visit: Payer: Self-pay | Admitting: Family Medicine

## 2024-08-12 NOTE — Progress Notes (Unsigned)
 [facility] [address], [address] [place], [place] [address] [phone]  Date:  08/13/2024   Name:  Sierra Lyons   DOB:  02-12-1961   MRN:  [id number]  PCP:  Watt Harlene BROCKS, MD    Chief Complaint: No chief complaint on file.   History of Present Illness:  Sierra Lyons is a 63 y.o. very pleasant female patient who presents with the following:  Patient seen today for physical exam.  I saw her most recently in February.  That time she was planned to do a tummy tuck operation in April at [facility] History of hyperlipidemia, asthma and allergies, LADA type diabetes She does have endocrinology care - Dr Marta in [place].  She saw him most recently A1c running 6.2%.   Can update foot exam Flu shot A1c up-to-date as above Recommend COVID booster May need urine micro Mammogram Eye exam up-to-date Pap smear-?  Per GYN Colonoscopy 2024 Bone density completed 2022-osteopenia She has completed Shingrix and pneumonia vaccination  Mounjaro Insulin -Tresiba  Venlafaxine  Atorvastatin  Breztri  inhaler Flovent ? Montelukast   Discussed the use of AI scribe software for clinical note transcription with the patient, who gave verbal consent to proceed.  History of Present Illness     Patient Active Problem List   Diagnosis Date Noted   Asthma    Sepsis (HCC) 03/10/2023   VSD (ventricular septal defect), muscular very small pick up on coronary CT angio 09/14/2021   Hyperlipidemia 08/31/2021   Vitamin D  deficiency 08/31/2021   Osteopenia 08/01/2021   Atypical chest pain 07/15/2021   Dyspnea on exertion 07/15/2021   Diabetes mellitus without complication (HCC) 07/11/2021   Chronic rhinitis 07/11/2021   Type 1 diabetes mellitus with hyperglycemia (HCC) 07/20/2020   Latent autoimmune diabetes in adults (LADA), managed as type 1 (HCC) 02/06/2020   Dyslipidemia 02/06/2020   Overweight 01/05/2016   Moderate  persistent asthma 09/07/2015   Allergic rhinitis 08/10/2015    Past Medical History:  Diagnosis Date   Allergic rhinitis 08/10/2015   Asthma    Atypical chest pain 07/15/2021   Chronic rhinitis around 1986   Diabetes mellitus without complication (HCC)    Dyslipidemia 02/06/2020   Dyspnea on exertion 07/15/2021   Hyperlipidemia 08/31/2021   Latent autoimmune diabetes in adults (LADA), managed as type 1 (HCC) 02/06/2020   Moderate persistent asthma 09/07/2015   Osteopenia 08/01/2021   Overweight 01/05/2016   Sepsis (HCC) 03/10/2023   Type 1 diabetes mellitus with hyperglycemia (HCC) 07/20/2020   Vitamin D  deficiency 08/31/2021   VSD (ventricular septal defect), muscular very small pick up on coronary CT angio 09/14/2021    Past Surgical History:  Procedure Laterality Date   CESAREAN SECTION  1993, 1997, 1999   VEIN SURGERY  2012    Social History   Tobacco Use   Smoking status: Never   Smokeless tobacco: Never  Vaping Use   Vaping status: Never Used  Substance Use Topics   Alcohol use: No   Drug use: No    Family History  Problem Relation Age of Onset   Diabetes Mother    Hypertension Mother    Allergic rhinitis Father    Allergic rhinitis Sister    Asthma Sister    Urticaria Sister    Diabetes Maternal Grandmother    Hypertension Maternal Grandmother     Allergies  Allergen Reactions   Aspirin Swelling    Swelling of the face     Medication list  has been reviewed and updated.  Current Outpatient Medications on File Prior to Visit  Medication Sig Dispense Refill   Albuterol -Budesonide  (AIRSUPRA ) 90-80 MCG/ACT AERO Inhale 2 Inhalations into the lungs as needed. Maximum 12 puffs per day. 10.7 g 3   atorvastatin  (LIPITOR) 20 MG tablet TAKE 1 TABLET BY MOUTH DAILY 90 tablet 3   Budeson-Glycopyrrol-Formoterol  (BREZTRI  AEROSPHERE) 160-9-4.8 MCG/ACT AERO 2 puffs twice daily with spacer to prevent coughing or wheezing. Rinse, gargle, and spit after use. 3 each  1   FLOVENT  HFA 110 MCG/ACT inhaler Inhale 2 puffs into the lungs 2 (two) times daily. 1 each 5   fluticasone  (FLONASE ) 50 MCG/ACT nasal spray SHAKE LIQUID AND USE 1 SPRAY IN EACH NOSTRIL DAILY AS NEEDED FOR ALLERGIES 48 g 0   insulin  degludec (TRESIBA  FLEXTOUCH) 100 UNIT/ML FlexTouch Pen Inject 6 Units into the skin daily.     Insulin  Pen Needle (BD PEN NEEDLE NANO 2ND GEN) 32G X 4 MM MISC 2 times daily 100 each 0   montelukast  (SINGULAIR ) 10 MG tablet Take 1 tablet (10 mg total) by mouth at bedtime. 90 tablet 1   MOUNJARO 10 MG/0.5ML Pen Inject 10 mg into the skin once a week.     venlafaxine  XR (EFFEXOR -XR) 150 MG 24 hr capsule TAKE 1 CAPSULE BY MOUTH DAILY  WITH BREAKFAST 90 capsule 3   No current facility-administered medications on file prior to visit.    Review of Systems:  As per HPI- otherwise negative.   Physical Examination: There were no vitals filed for this visit. There were no vitals filed for this visit. There is no height or weight on file to calculate BMI. Ideal Body Weight:    GEN: no acute distress. HEENT: Atraumatic, Normocephalic.  Ears and Nose: No external deformity. CV: RRR, No M/G/R. No JVD. No thrill. No extra heart sounds. PULM: CTA B, no wheezes, crackles, rhonchi. No retractions. No resp. distress. No accessory muscle use. ABD: S, NT, ND, +BS. No rebound. No HSM. EXTR: No c/c/e PSYCH: Normally interactive. Conversant.    Assessment and Plan: No diagnosis found.  Assessment & Plan   Signed Harlene Schroeder, MD

## 2024-08-13 ENCOUNTER — Encounter: Payer: Self-pay | Admitting: Family Medicine

## 2024-08-13 ENCOUNTER — Ambulatory Visit (INDEPENDENT_AMBULATORY_CARE_PROVIDER_SITE_OTHER): Admitting: Family Medicine

## 2024-08-13 VITALS — BP 112/70 | HR 90 | Temp 98.0°F | Ht 65.0 in | Wt 140.8 lb

## 2024-08-13 DIAGNOSIS — E785 Hyperlipidemia, unspecified: Secondary | ICD-10-CM

## 2024-08-13 DIAGNOSIS — E2839 Other primary ovarian failure: Secondary | ICD-10-CM

## 2024-08-13 DIAGNOSIS — J4540 Moderate persistent asthma, uncomplicated: Secondary | ICD-10-CM

## 2024-08-13 DIAGNOSIS — E139 Other specified diabetes mellitus without complications: Secondary | ICD-10-CM | POA: Diagnosis not present

## 2024-08-13 DIAGNOSIS — Z0000 Encounter for general adult medical examination without abnormal findings: Secondary | ICD-10-CM | POA: Diagnosis not present

## 2024-08-13 DIAGNOSIS — Z130 Encounter for screening for diseases of the blood and blood-forming organs and certain disorders involving the immune mechanism: Secondary | ICD-10-CM

## 2024-08-13 DIAGNOSIS — Z1231 Encounter for screening mammogram for malignant neoplasm of breast: Secondary | ICD-10-CM

## 2024-08-13 DIAGNOSIS — Z1329 Encounter for screening for other suspected endocrine disorder: Secondary | ICD-10-CM

## 2024-08-13 LAB — COMPREHENSIVE METABOLIC PANEL WITH GFR
ALT: 16 U/L (ref 0–35)
AST: 19 U/L (ref 0–37)
Albumin: 4.5 g/dL (ref 3.5–5.2)
Alkaline Phosphatase: 50 U/L (ref 39–117)
BUN: 20 mg/dL (ref 6–23)
CO2: 29 meq/L (ref 19–32)
Calcium: 9.7 mg/dL (ref 8.4–10.5)
Chloride: 101 meq/L (ref 96–112)
Creatinine, Ser: 0.69 mg/dL (ref 0.40–1.20)
GFR: 93.81 mL/min (ref >60.00)
Glucose, Bld: 123 mg/dL — ABNORMAL HIGH (ref 70–99)
Potassium: 4.5 meq/L (ref 3.5–5.1)
Sodium: 137 meq/L (ref 135–145)
Total Bilirubin: 0.4 mg/dL (ref 0.2–1.2)
Total Protein: 7.1 g/dL (ref 6.0–8.3)

## 2024-08-13 LAB — CBC
HCT: 42.7 % (ref 36.0–46.0)
Hemoglobin: 14.3 g/dL (ref 12.0–15.0)
MCHC: 33.5 g/dL (ref 30.0–36.0)
MCV: 93.7 fl (ref 78.0–100.0)
Platelets: 232 K/uL (ref 150.0–400.0)
RBC: 4.56 Mil/uL (ref 3.87–5.11)
RDW: 14.1 % (ref 11.5–15.5)
WBC: 5.7 K/uL (ref 4.0–10.5)

## 2024-08-13 LAB — LIPID PANEL
Cholesterol: 163 mg/dL (ref 0–200)
HDL: 72.4 mg/dL (ref >39.00)
LDL Cholesterol: 70 mg/dL (ref 0–99)
NonHDL: 90.5
Total CHOL/HDL Ratio: 2
Triglycerides: 104 mg/dL (ref 0.0–149.0)
VLDL: 20.8 mg/dL (ref 0.0–40.0)

## 2024-08-13 LAB — MICROALBUMIN / CREATININE URINE RATIO
Creatinine,U: 100.4 mg/dL
Microalb Creat Ratio: 7.2 mg/g (ref 0.0–30.0)
Microalb, Ur: 0.7 mg/dL (ref 0.0–1.9)

## 2024-08-13 LAB — TSH: TSH: 0.79 u[IU]/mL (ref 0.35–5.50)

## 2024-08-13 MED ORDER — ALBUTEROL SULFATE HFA 108 (90 BASE) MCG/ACT IN AERS
2.0000 | INHALATION_SPRAY | Freq: Four times a day (QID) | RESPIRATORY_TRACT | 5 refills | Status: AC | PRN
Start: 2024-08-13 — End: ?

## 2024-08-13 NOTE — Patient Instructions (Signed)
 Good to see you today!  I will be in touch with your labs Ordered bone density and mammo if you would like to stop at the ground floor and schedule these  Take care!

## 2024-08-18 ENCOUNTER — Ambulatory Visit: Admitting: Internal Medicine

## 2024-08-25 ENCOUNTER — Encounter: Payer: Self-pay | Admitting: Internal Medicine

## 2024-08-25 ENCOUNTER — Other Ambulatory Visit: Payer: Self-pay

## 2024-08-25 ENCOUNTER — Ambulatory Visit: Admitting: Internal Medicine

## 2024-08-25 VITALS — BP 92/64 | HR 81 | Temp 97.7°F | Resp 16 | Wt 144.8 lb

## 2024-08-25 DIAGNOSIS — J3089 Other allergic rhinitis: Secondary | ICD-10-CM | POA: Diagnosis not present

## 2024-08-25 DIAGNOSIS — J4540 Moderate persistent asthma, uncomplicated: Secondary | ICD-10-CM

## 2024-08-25 MED ORDER — MONTELUKAST SODIUM 10 MG PO TABS: 10.0000 mg | ORAL_TABLET | Freq: Every day | ORAL | 1 refills | Status: AC

## 2024-08-25 MED ORDER — AIRSUPRA 90-80 MCG/ACT IN AERO: 2.0000 | INHALATION_SPRAY | RESPIRATORY_TRACT | 3 refills | Status: AC | PRN

## 2024-08-25 MED ORDER — BREZTRI AEROSPHERE 160-9-4.8 MCG/ACT IN AERO: INHALATION_SPRAY | RESPIRATORY_TRACT | 1 refills | Status: AC

## 2024-08-25 NOTE — Patient Instructions (Addendum)
 Moderate persistent asthma-at goal Asthma action plan: Breztri  2 inhalations twice daily-At onset of respiratory illness/asthma flare: Inhale 2 puffs twice daily with spacer for 2 weeks or until symptoms resolve. Use a spacer. Rinse mouth after use Continue montelukast  10 mg daily.  A refill prescription has been provided. - if you do not feel this helps your symptoms, okay to discontinue and not get refills RESCUE-AirSUPRA  (use in place of albuterol  HFA), 2 inhalations as needed, maximum 12 inhalations per day.  Allergic rhinitis- at goal Continue appropriate allergen avoidance measures (dust mites, cat and dog) Consider allergy injections to help your immune system learn to tolerate your allergens resulting in less symptoms and less need for medications.  Will need to update testing if interested in allergy injections.  Daily antihistamine-Your options include: Zyrtec (cetirizine) 10 mg, Claritin (loratadine) 10 mg, Xyzal (levocetirizine) 5 mg or Allegra (fexofenadine) 180 mg daily as needed Montelukast  as above Continue fluticasone  nasal spray, one spray per nostril 1-2 times daily as needed.  Nasal saline spray (i.e., Simply Saline) or nasal saline lavage (i.e., NeilMed) is recommended as needed and prior to medicated nasal sprays.  Follow-up in 6 months, sooner if needed. It was wonderful seeing you today! Thank you for letting me participate in your care. Rocky Endow, MD [facility]

## 2024-08-25 NOTE — Addendum Note (Signed)
 Addended by: AZALEA, Anet Logsdon on: 08/25/2024 12:05 PM   Modules accepted: Orders

## 2024-08-25 NOTE — Progress Notes (Signed)
 FOLLOW UP Date of Service/Encounter:   08/25/2024  Subjective:  Sierra Lyons (DOB: 08/19/1961) is a 63 y.o. female who [facility] in re-evaluation of the following: moderate persistent asthma and allergic rhinitis.    History obtained from: chart review and patient.  For Review, LV was on 07/26/22  with Dr.Malaya Cagley seen for routine follow-up. See below for summary of history and diagnostics.   Therapeutic plans/changes recommended: we started nucala  to help with asthma controlled as she was using flovent  and breztri  together at that time. We stopped flovent  when starting nucala . We gave her airsupra  as a rescue medication. ----------------------------------------------------- Pertinent History/Diagnostics:  Asthma -previously uncontrolled on Symbicort  160 switched to Breztri .  Fall and winter her worst seasons.  No systemic steroids in the past 12 months.  Does have a small VSD picked up on coronary CT angio. No planned intervention. - biologics labs: AEC 1,200 (06/28/22), total IgE 53 - sample nucala  given 07/31/22, patient insurance approved 08/02/22; lost to follow-up. Allergic rhinitis:  - IgE environmental panel 2023: + dust mites, cat and dog --------------------------------------------------- Today presents for follow-up. Discussed the use of AI scribe software for clinical note transcription with the patient, who gave verbal consent to proceed.  History of Present Illness Sierra Lyons is a 63 year old female with asthma who presents for evaluation of allergy shots. She was referred by her primary care physician for consideration of allergy shots.  Asthma symptoms and management - Asthma symptoms worsen during seasonal changes, particularly in the fall and spring - Uses albuterol  as needed for acute symptoms - Uses Breztri  in the mornings and evenings when symptoms arise - Previously used Nucala , discontinued after one month due  to insurance issues (2023) - She has not been back to follow-up since 2023, and has not received any refill Breztri  since; however, has Breztri  at home - she is not using Breztri  daily, only during symptomatic seasons - she has not required ER/UC or systemic steroids since last visit - she is not using montelukast ; has been out of refills; unclear if helped her symptoms  Allergic rhinitis and environmental allergies - Allergies to dust mites, cats, and dogs - Occasional symptoms include runny nose, not overly bothersome - Uses Zyrtec as needed for symptom relief - Not currently taking montelukast  due to running out and uncertainty of effectiveness - has pets in home - not sure about allergy injections; will consider  All medications reviewed by clinical staff and updated in chart. No new pertinent medical or surgical history except as noted in HPI.  ROS: All others negative except as noted per HPI.   Objective:  BP (!) 84/54 (BP Location: Left Arm, Patient Position: Sitting, Cuff Size: Normal)   Pulse 81   Temp 97.7 F (36.5 C) (Temporal)   Resp 16   Wt 144 lb 12.8 oz (65.7 kg)   SpO2 97%   BMI 24.10 kg/m  Body mass index is 24.1 kg/m. Physical Exam: General Appearance:  Alert, cooperative, no distress, appears stated age  Head:  Normocephalic, without obvious abnormality, atraumatic  Eyes:  Conjunctiva clear, EOM's intact  Ears EACs normal bilaterally and normal TMs bilaterally  Nose: Nares normal, hypertrophic turbinates and normal mucosa  Throat: Lips, tongue normal; teeth and gums normal, normal posterior oropharynx  Neck: Supple, symmetrical  Lungs:   clear to auscultation bilaterally, Respirations unlabored, no coughing  Heart:  regular rate and rhythm and no murmur, Appears well perfused  Extremities:  No edema  Skin: Skin color, texture, turgor normal and no rashes or lesions on visualized portions of skin  Neurologic: No gross deficits   Labs:  Lab Orders  No  laboratory test(s) ordered today    Spirometry:  Tracings reviewed. Her effort: Good reproducible efforts. FVC: 3.58L FEV1: 2.54L, 99% predicted FEV1/FVC ratio: 0.71 Interpretation: Spirometry consistent with normal pattern.  Please see scanned spirometry results for details.  Assessment/Plan   Moderate persistent asthma-at goal Her asthma is at goal on as needed ICS/LABA/LAMA without flares requiring higher level support or steroids. Nucala  not necessary at this time.  Asthma action plan: Breztri  2 inhalations twice daily-At onset of respiratory illness/asthma flare: Inhale 2 puffs twice daily with spacer for 2 weeks or until symptoms resolve. Use a spacer. Rinse mouth after use Continue montelukast  10 mg daily.  A refill prescription has been provided. - if you do not feel this helps your symptoms, okay to discontinue and not get refills RESCUE-AirSUPRA  (use in place of albuterol  HFA), 2 inhalations as needed, maximum 12 inhalations per day.  Allergic rhinitis- at goal Candidate for allergy injections, but will consider. Allergies overall controlled on zyrtec as needed.  Continue appropriate allergen avoidance measures (dust mites, cat and dog) Consider allergy injections to help your immune system learn to tolerate your allergens resulting in less symptoms and less need for medications.  Will need to update testing if interested in allergy injections.  Daily antihistamine-Your options include: Zyrtec (cetirizine) 10 mg, Claritin (loratadine) 10 mg, Xyzal (levocetirizine) 5 mg or Allegra (fexofenadine) 180 mg daily as needed Montelukast  as above Continue fluticasone  nasal spray, one spray per nostril 1-2 times daily as needed.  Nasal saline spray (i.e., Simply Saline) or nasal saline lavage (i.e., NeilMed) is recommended as needed and prior to medicated nasal sprays.  Follow-up in 6 months, sooner if needed. It was wonderful seeing you today! Thank you for letting me participate in  your care. Rocky Endow, MD [facility]  Other: none  Rocky Endow, MD  [facility]  

## 2024-08-25 NOTE — Addendum Note (Signed)
 Addended by: AZALEA, Kavon Valenza on: 08/25/2024 12:11 PM   Modules accepted: Orders

## 2024-09-10 ENCOUNTER — Other Ambulatory Visit: Payer: Self-pay | Admitting: Family Medicine

## 2024-09-10 DIAGNOSIS — E785 Hyperlipidemia, unspecified: Secondary | ICD-10-CM

## 2024-09-23 ENCOUNTER — Other Ambulatory Visit: Payer: Self-pay | Admitting: Medical Genetics

## 2024-09-30 ENCOUNTER — Ambulatory Visit (HOSPITAL_BASED_OUTPATIENT_CLINIC_OR_DEPARTMENT_OTHER)
Admission: RE | Admit: 2024-09-30 | Discharge: 2024-09-30 | Disposition: A | Discharge status: Home / Self Care | Source: Ambulatory Visit | Attending: Family Medicine | Admitting: Family Medicine

## 2024-09-30 ENCOUNTER — Encounter: Payer: Self-pay | Admitting: Family Medicine

## 2024-09-30 ENCOUNTER — Encounter (HOSPITAL_BASED_OUTPATIENT_CLINIC_OR_DEPARTMENT_OTHER): Payer: Self-pay

## 2024-09-30 DIAGNOSIS — E2839 Other primary ovarian failure: Secondary | ICD-10-CM | POA: Diagnosis present

## 2024-09-30 DIAGNOSIS — Z1231 Encounter for screening mammogram for malignant neoplasm of breast: Secondary | ICD-10-CM | POA: Diagnosis present

## 2024-10-14 ENCOUNTER — Other Ambulatory Visit (HOSPITAL_COMMUNITY): Payer: Self-pay

## 2024-11-20 ENCOUNTER — Other Ambulatory Visit (HOSPITAL_COMMUNITY): Payer: Self-pay
